# Patient Record
Sex: Male | Born: 1950 | Race: White | Hispanic: No | Marital: Single | Smoking: Never smoker
Health system: Southern US, Community
[De-identification: ages and names within clinical notes are randomized; demographics above are authoritative.]

## PROBLEM LIST (undated history)

## (undated) DIAGNOSIS — I2510 Atherosclerotic heart disease of native coronary artery without angina pectoris: Secondary | ICD-10-CM

## (undated) DIAGNOSIS — N289 Disorder of kidney and ureter, unspecified: Secondary | ICD-10-CM

## (undated) DIAGNOSIS — I4891 Unspecified atrial fibrillation: Secondary | ICD-10-CM

## (undated) DIAGNOSIS — I10 Essential (primary) hypertension: Secondary | ICD-10-CM

## (undated) DIAGNOSIS — E119 Type 2 diabetes mellitus without complications: Secondary | ICD-10-CM

## (undated) DIAGNOSIS — I509 Heart failure, unspecified: Secondary | ICD-10-CM

## (undated) HISTORY — DX: Type 2 diabetes mellitus without complications: E11.9

---

## 2005-09-25 ENCOUNTER — Emergency Department: Payer: Self-pay | Admitting: Emergency Medicine

## 2008-04-27 ENCOUNTER — Inpatient Hospital Stay: Payer: Self-pay | Admitting: Internal Medicine

## 2008-05-04 ENCOUNTER — Emergency Department: Payer: Self-pay | Admitting: Emergency Medicine

## 2008-05-06 ENCOUNTER — Ambulatory Visit: Payer: Self-pay | Admitting: Nephrology

## 2008-05-20 ENCOUNTER — Ambulatory Visit: Payer: Self-pay | Admitting: Nephrology

## 2008-07-02 ENCOUNTER — Ambulatory Visit: Payer: Self-pay | Admitting: Family

## 2008-07-21 ENCOUNTER — Ambulatory Visit: Payer: Self-pay | Admitting: Family

## 2008-08-16 ENCOUNTER — Ambulatory Visit: Payer: Self-pay | Admitting: Internal Medicine

## 2008-10-21 ENCOUNTER — Ambulatory Visit: Payer: Self-pay | Admitting: Family

## 2009-01-19 ENCOUNTER — Ambulatory Visit: Payer: Self-pay | Admitting: Family

## 2009-02-14 ENCOUNTER — Ambulatory Visit: Payer: Self-pay | Admitting: Cardiology

## 2009-06-02 ENCOUNTER — Ambulatory Visit: Payer: Self-pay | Admitting: Family

## 2009-09-21 ENCOUNTER — Ambulatory Visit: Payer: Self-pay | Admitting: Family

## 2010-01-26 ENCOUNTER — Ambulatory Visit: Payer: Self-pay | Admitting: Family

## 2010-05-25 ENCOUNTER — Ambulatory Visit: Payer: Self-pay | Admitting: Family

## 2013-07-21 DIAGNOSIS — I10 Essential (primary) hypertension: Secondary | ICD-10-CM | POA: Diagnosis present

## 2013-07-21 DIAGNOSIS — I2510 Atherosclerotic heart disease of native coronary artery without angina pectoris: Secondary | ICD-10-CM | POA: Diagnosis present

## 2013-09-22 DIAGNOSIS — E785 Hyperlipidemia, unspecified: Secondary | ICD-10-CM | POA: Diagnosis present

## 2014-02-22 DIAGNOSIS — I480 Paroxysmal atrial fibrillation: Secondary | ICD-10-CM | POA: Diagnosis present

## 2015-04-28 DIAGNOSIS — I480 Paroxysmal atrial fibrillation: Secondary | ICD-10-CM | POA: Diagnosis not present

## 2015-05-10 DIAGNOSIS — I480 Paroxysmal atrial fibrillation: Secondary | ICD-10-CM | POA: Diagnosis not present

## 2015-06-08 DIAGNOSIS — N183 Chronic kidney disease, stage 3 (moderate): Secondary | ICD-10-CM | POA: Diagnosis not present

## 2015-06-08 DIAGNOSIS — I482 Chronic atrial fibrillation: Secondary | ICD-10-CM | POA: Diagnosis not present

## 2015-06-08 DIAGNOSIS — Z125 Encounter for screening for malignant neoplasm of prostate: Secondary | ICD-10-CM | POA: Diagnosis not present

## 2015-06-08 DIAGNOSIS — I480 Paroxysmal atrial fibrillation: Secondary | ICD-10-CM | POA: Diagnosis not present

## 2015-06-08 DIAGNOSIS — E7800 Pure hypercholesterolemia, unspecified: Secondary | ICD-10-CM | POA: Diagnosis not present

## 2015-06-08 DIAGNOSIS — Z1211 Encounter for screening for malignant neoplasm of colon: Secondary | ICD-10-CM | POA: Diagnosis not present

## 2015-06-08 DIAGNOSIS — Z0000 Encounter for general adult medical examination without abnormal findings: Secondary | ICD-10-CM | POA: Diagnosis not present

## 2015-06-08 DIAGNOSIS — I10 Essential (primary) hypertension: Secondary | ICD-10-CM | POA: Diagnosis not present

## 2015-06-08 DIAGNOSIS — Z79899 Other long term (current) drug therapy: Secondary | ICD-10-CM | POA: Diagnosis not present

## 2015-06-08 DIAGNOSIS — I517 Cardiomegaly: Secondary | ICD-10-CM | POA: Diagnosis not present

## 2015-06-08 DIAGNOSIS — R7309 Other abnormal glucose: Secondary | ICD-10-CM | POA: Diagnosis not present

## 2015-06-08 DIAGNOSIS — R739 Hyperglycemia, unspecified: Secondary | ICD-10-CM | POA: Diagnosis not present

## 2015-07-06 DIAGNOSIS — Z79899 Other long term (current) drug therapy: Secondary | ICD-10-CM | POA: Diagnosis not present

## 2015-07-06 DIAGNOSIS — Z125 Encounter for screening for malignant neoplasm of prostate: Secondary | ICD-10-CM | POA: Diagnosis not present

## 2015-07-06 DIAGNOSIS — R7309 Other abnormal glucose: Secondary | ICD-10-CM | POA: Diagnosis not present

## 2015-07-06 DIAGNOSIS — E7800 Pure hypercholesterolemia, unspecified: Secondary | ICD-10-CM | POA: Diagnosis not present

## 2015-07-06 DIAGNOSIS — Z1211 Encounter for screening for malignant neoplasm of colon: Secondary | ICD-10-CM | POA: Diagnosis not present

## 2015-07-06 DIAGNOSIS — I480 Paroxysmal atrial fibrillation: Secondary | ICD-10-CM | POA: Diagnosis not present

## 2015-07-06 DIAGNOSIS — I10 Essential (primary) hypertension: Secondary | ICD-10-CM | POA: Diagnosis not present

## 2015-07-11 DIAGNOSIS — R7309 Other abnormal glucose: Secondary | ICD-10-CM | POA: Diagnosis not present

## 2015-07-11 DIAGNOSIS — Z125 Encounter for screening for malignant neoplasm of prostate: Secondary | ICD-10-CM | POA: Diagnosis not present

## 2015-07-11 DIAGNOSIS — Z1211 Encounter for screening for malignant neoplasm of colon: Secondary | ICD-10-CM | POA: Diagnosis not present

## 2015-07-11 DIAGNOSIS — I10 Essential (primary) hypertension: Secondary | ICD-10-CM | POA: Diagnosis not present

## 2015-07-11 DIAGNOSIS — E7800 Pure hypercholesterolemia, unspecified: Secondary | ICD-10-CM | POA: Diagnosis not present

## 2015-07-11 DIAGNOSIS — Z79899 Other long term (current) drug therapy: Secondary | ICD-10-CM | POA: Diagnosis not present

## 2015-08-03 DIAGNOSIS — I480 Paroxysmal atrial fibrillation: Secondary | ICD-10-CM | POA: Diagnosis not present

## 2015-08-03 DIAGNOSIS — N2581 Secondary hyperparathyroidism of renal origin: Secondary | ICD-10-CM | POA: Diagnosis not present

## 2015-08-03 DIAGNOSIS — I5022 Chronic systolic (congestive) heart failure: Secondary | ICD-10-CM | POA: Diagnosis not present

## 2015-08-03 DIAGNOSIS — I10 Essential (primary) hypertension: Secondary | ICD-10-CM | POA: Diagnosis not present

## 2015-08-03 DIAGNOSIS — N183 Chronic kidney disease, stage 3 (moderate): Secondary | ICD-10-CM | POA: Diagnosis not present

## 2015-08-22 DIAGNOSIS — I480 Paroxysmal atrial fibrillation: Secondary | ICD-10-CM | POA: Diagnosis not present

## 2015-09-19 DIAGNOSIS — I480 Paroxysmal atrial fibrillation: Secondary | ICD-10-CM | POA: Diagnosis not present

## 2015-10-24 DIAGNOSIS — I480 Paroxysmal atrial fibrillation: Secondary | ICD-10-CM | POA: Diagnosis not present

## 2015-11-21 DIAGNOSIS — I480 Paroxysmal atrial fibrillation: Secondary | ICD-10-CM | POA: Diagnosis not present

## 2015-12-15 DIAGNOSIS — I480 Paroxysmal atrial fibrillation: Secondary | ICD-10-CM | POA: Diagnosis not present

## 2015-12-15 DIAGNOSIS — I10 Essential (primary) hypertension: Secondary | ICD-10-CM | POA: Diagnosis not present

## 2015-12-15 DIAGNOSIS — E876 Hypokalemia: Secondary | ICD-10-CM | POA: Diagnosis not present

## 2015-12-15 DIAGNOSIS — R739 Hyperglycemia, unspecified: Secondary | ICD-10-CM | POA: Diagnosis not present

## 2015-12-15 DIAGNOSIS — T502X5A Adverse effect of carbonic-anhydrase inhibitors, benzothiadiazides and other diuretics, initial encounter: Secondary | ICD-10-CM | POA: Diagnosis not present

## 2015-12-15 DIAGNOSIS — Z7901 Long term (current) use of anticoagulants: Secondary | ICD-10-CM

## 2015-12-15 DIAGNOSIS — Z79899 Other long term (current) drug therapy: Secondary | ICD-10-CM | POA: Diagnosis not present

## 2015-12-15 DIAGNOSIS — N183 Chronic kidney disease, stage 3 (moderate): Secondary | ICD-10-CM | POA: Diagnosis not present

## 2015-12-15 DIAGNOSIS — E7800 Pure hypercholesterolemia, unspecified: Secondary | ICD-10-CM | POA: Diagnosis not present

## 2015-12-26 DIAGNOSIS — I10 Essential (primary) hypertension: Secondary | ICD-10-CM | POA: Diagnosis not present

## 2015-12-26 DIAGNOSIS — Z79899 Other long term (current) drug therapy: Secondary | ICD-10-CM | POA: Diagnosis not present

## 2015-12-26 DIAGNOSIS — E7800 Pure hypercholesterolemia, unspecified: Secondary | ICD-10-CM | POA: Diagnosis not present

## 2015-12-26 DIAGNOSIS — I480 Paroxysmal atrial fibrillation: Secondary | ICD-10-CM | POA: Diagnosis not present

## 2015-12-26 DIAGNOSIS — R739 Hyperglycemia, unspecified: Secondary | ICD-10-CM | POA: Diagnosis not present

## 2016-01-30 DIAGNOSIS — I480 Paroxysmal atrial fibrillation: Secondary | ICD-10-CM | POA: Diagnosis not present

## 2016-02-16 DIAGNOSIS — I5022 Chronic systolic (congestive) heart failure: Secondary | ICD-10-CM | POA: Diagnosis not present

## 2016-02-16 DIAGNOSIS — I10 Essential (primary) hypertension: Secondary | ICD-10-CM | POA: Diagnosis not present

## 2016-02-16 DIAGNOSIS — N183 Chronic kidney disease, stage 3 (moderate): Secondary | ICD-10-CM | POA: Diagnosis not present

## 2016-02-16 DIAGNOSIS — N2581 Secondary hyperparathyroidism of renal origin: Secondary | ICD-10-CM | POA: Diagnosis not present

## 2016-02-22 DIAGNOSIS — I4891 Unspecified atrial fibrillation: Secondary | ICD-10-CM | POA: Diagnosis not present

## 2016-02-22 DIAGNOSIS — I509 Heart failure, unspecified: Secondary | ICD-10-CM | POA: Diagnosis not present

## 2016-02-22 DIAGNOSIS — I2510 Atherosclerotic heart disease of native coronary artery without angina pectoris: Secondary | ICD-10-CM | POA: Diagnosis not present

## 2016-02-22 DIAGNOSIS — I10 Essential (primary) hypertension: Secondary | ICD-10-CM | POA: Diagnosis not present

## 2016-02-22 DIAGNOSIS — E7800 Pure hypercholesterolemia, unspecified: Secondary | ICD-10-CM | POA: Diagnosis not present

## 2016-02-22 DIAGNOSIS — I480 Paroxysmal atrial fibrillation: Secondary | ICD-10-CM | POA: Diagnosis not present

## 2016-02-22 DIAGNOSIS — I429 Cardiomyopathy, unspecified: Secondary | ICD-10-CM | POA: Diagnosis not present

## 2016-03-14 DIAGNOSIS — I5022 Chronic systolic (congestive) heart failure: Secondary | ICD-10-CM | POA: Diagnosis not present

## 2016-03-14 DIAGNOSIS — I10 Essential (primary) hypertension: Secondary | ICD-10-CM | POA: Diagnosis not present

## 2016-03-14 DIAGNOSIS — N183 Chronic kidney disease, stage 3 (moderate): Secondary | ICD-10-CM | POA: Diagnosis not present

## 2016-03-14 DIAGNOSIS — N2581 Secondary hyperparathyroidism of renal origin: Secondary | ICD-10-CM | POA: Diagnosis not present

## 2016-03-28 DIAGNOSIS — I480 Paroxysmal atrial fibrillation: Secondary | ICD-10-CM | POA: Diagnosis not present

## 2016-04-06 DIAGNOSIS — I480 Paroxysmal atrial fibrillation: Secondary | ICD-10-CM | POA: Diagnosis not present

## 2016-05-07 DIAGNOSIS — I480 Paroxysmal atrial fibrillation: Secondary | ICD-10-CM | POA: Diagnosis not present

## 2016-05-22 DIAGNOSIS — I4891 Unspecified atrial fibrillation: Secondary | ICD-10-CM | POA: Diagnosis not present

## 2016-06-05 DIAGNOSIS — I4891 Unspecified atrial fibrillation: Secondary | ICD-10-CM | POA: Diagnosis not present

## 2016-06-19 DIAGNOSIS — Z125 Encounter for screening for malignant neoplasm of prostate: Secondary | ICD-10-CM | POA: Diagnosis not present

## 2016-06-19 DIAGNOSIS — E7800 Pure hypercholesterolemia, unspecified: Secondary | ICD-10-CM | POA: Diagnosis not present

## 2016-06-19 DIAGNOSIS — I480 Paroxysmal atrial fibrillation: Secondary | ICD-10-CM | POA: Diagnosis not present

## 2016-06-19 DIAGNOSIS — Z79899 Other long term (current) drug therapy: Secondary | ICD-10-CM | POA: Diagnosis not present

## 2016-06-19 DIAGNOSIS — E876 Hypokalemia: Secondary | ICD-10-CM | POA: Diagnosis not present

## 2016-06-19 DIAGNOSIS — Z1211 Encounter for screening for malignant neoplasm of colon: Secondary | ICD-10-CM | POA: Diagnosis not present

## 2016-06-19 DIAGNOSIS — Z0000 Encounter for general adult medical examination without abnormal findings: Secondary | ICD-10-CM | POA: Diagnosis not present

## 2016-06-19 DIAGNOSIS — N183 Chronic kidney disease, stage 3 (moderate): Secondary | ICD-10-CM | POA: Diagnosis not present

## 2016-06-19 DIAGNOSIS — M1712 Unilateral primary osteoarthritis, left knee: Secondary | ICD-10-CM | POA: Diagnosis not present

## 2016-06-19 DIAGNOSIS — M25562 Pain in left knee: Secondary | ICD-10-CM | POA: Diagnosis not present

## 2016-06-19 DIAGNOSIS — R739 Hyperglycemia, unspecified: Secondary | ICD-10-CM | POA: Diagnosis not present

## 2016-06-19 DIAGNOSIS — I10 Essential (primary) hypertension: Secondary | ICD-10-CM | POA: Diagnosis not present

## 2016-06-19 DIAGNOSIS — I482 Chronic atrial fibrillation: Secondary | ICD-10-CM | POA: Diagnosis not present

## 2016-07-03 DIAGNOSIS — I10 Essential (primary) hypertension: Secondary | ICD-10-CM | POA: Diagnosis not present

## 2016-07-03 DIAGNOSIS — Z125 Encounter for screening for malignant neoplasm of prostate: Secondary | ICD-10-CM | POA: Diagnosis not present

## 2016-07-03 DIAGNOSIS — Z79899 Other long term (current) drug therapy: Secondary | ICD-10-CM | POA: Diagnosis not present

## 2016-07-03 DIAGNOSIS — R739 Hyperglycemia, unspecified: Secondary | ICD-10-CM | POA: Diagnosis not present

## 2016-07-03 DIAGNOSIS — E7800 Pure hypercholesterolemia, unspecified: Secondary | ICD-10-CM | POA: Diagnosis not present

## 2016-07-03 DIAGNOSIS — I4891 Unspecified atrial fibrillation: Secondary | ICD-10-CM | POA: Diagnosis not present

## 2016-07-16 DIAGNOSIS — Z1211 Encounter for screening for malignant neoplasm of colon: Secondary | ICD-10-CM | POA: Diagnosis not present

## 2016-07-31 DIAGNOSIS — I4891 Unspecified atrial fibrillation: Secondary | ICD-10-CM | POA: Diagnosis not present

## 2016-08-14 DIAGNOSIS — N183 Chronic kidney disease, stage 3 (moderate): Secondary | ICD-10-CM | POA: Diagnosis not present

## 2016-08-14 DIAGNOSIS — I10 Essential (primary) hypertension: Secondary | ICD-10-CM | POA: Diagnosis not present

## 2016-08-14 DIAGNOSIS — N2581 Secondary hyperparathyroidism of renal origin: Secondary | ICD-10-CM | POA: Diagnosis not present

## 2016-08-14 DIAGNOSIS — I5022 Chronic systolic (congestive) heart failure: Secondary | ICD-10-CM | POA: Diagnosis not present

## 2016-08-27 DIAGNOSIS — I4891 Unspecified atrial fibrillation: Secondary | ICD-10-CM | POA: Diagnosis not present

## 2016-09-05 DIAGNOSIS — Z7901 Long term (current) use of anticoagulants: Secondary | ICD-10-CM | POA: Diagnosis not present

## 2016-09-05 DIAGNOSIS — Z5181 Encounter for therapeutic drug level monitoring: Secondary | ICD-10-CM | POA: Diagnosis not present

## 2016-10-03 DIAGNOSIS — I4891 Unspecified atrial fibrillation: Secondary | ICD-10-CM | POA: Diagnosis not present

## 2016-10-22 DIAGNOSIS — Z5181 Encounter for therapeutic drug level monitoring: Secondary | ICD-10-CM | POA: Diagnosis not present

## 2016-10-22 DIAGNOSIS — Z7901 Long term (current) use of anticoagulants: Secondary | ICD-10-CM | POA: Diagnosis not present

## 2016-11-19 DIAGNOSIS — I4891 Unspecified atrial fibrillation: Secondary | ICD-10-CM | POA: Diagnosis not present

## 2016-12-20 DIAGNOSIS — Z79899 Other long term (current) drug therapy: Secondary | ICD-10-CM | POA: Diagnosis not present

## 2016-12-20 DIAGNOSIS — R739 Hyperglycemia, unspecified: Secondary | ICD-10-CM | POA: Diagnosis not present

## 2016-12-20 DIAGNOSIS — I10 Essential (primary) hypertension: Secondary | ICD-10-CM | POA: Diagnosis not present

## 2016-12-20 DIAGNOSIS — I480 Paroxysmal atrial fibrillation: Secondary | ICD-10-CM | POA: Diagnosis not present

## 2016-12-20 DIAGNOSIS — E7800 Pure hypercholesterolemia, unspecified: Secondary | ICD-10-CM | POA: Diagnosis not present

## 2016-12-20 DIAGNOSIS — N183 Chronic kidney disease, stage 3 (moderate): Secondary | ICD-10-CM | POA: Diagnosis not present

## 2016-12-24 DIAGNOSIS — R739 Hyperglycemia, unspecified: Secondary | ICD-10-CM | POA: Diagnosis not present

## 2016-12-24 DIAGNOSIS — I480 Paroxysmal atrial fibrillation: Secondary | ICD-10-CM | POA: Diagnosis not present

## 2017-01-28 DIAGNOSIS — I4891 Unspecified atrial fibrillation: Secondary | ICD-10-CM | POA: Diagnosis not present

## 2017-02-18 DIAGNOSIS — I10 Essential (primary) hypertension: Secondary | ICD-10-CM | POA: Diagnosis not present

## 2017-02-18 DIAGNOSIS — N2581 Secondary hyperparathyroidism of renal origin: Secondary | ICD-10-CM | POA: Diagnosis not present

## 2017-02-18 DIAGNOSIS — N183 Chronic kidney disease, stage 3 (moderate): Secondary | ICD-10-CM | POA: Diagnosis not present

## 2017-02-18 DIAGNOSIS — I5022 Chronic systolic (congestive) heart failure: Secondary | ICD-10-CM | POA: Diagnosis not present

## 2017-03-06 DIAGNOSIS — I10 Essential (primary) hypertension: Secondary | ICD-10-CM | POA: Diagnosis not present

## 2017-03-06 DIAGNOSIS — E7801 Familial hypercholesterolemia: Secondary | ICD-10-CM | POA: Diagnosis not present

## 2017-03-06 DIAGNOSIS — I200 Unstable angina: Secondary | ICD-10-CM | POA: Diagnosis not present

## 2017-03-06 DIAGNOSIS — I480 Paroxysmal atrial fibrillation: Secondary | ICD-10-CM | POA: Diagnosis not present

## 2017-03-06 DIAGNOSIS — I2510 Atherosclerotic heart disease of native coronary artery without angina pectoris: Secondary | ICD-10-CM | POA: Diagnosis not present

## 2017-03-06 DIAGNOSIS — I4891 Unspecified atrial fibrillation: Secondary | ICD-10-CM | POA: Diagnosis not present

## 2017-03-06 DIAGNOSIS — I429 Cardiomyopathy, unspecified: Secondary | ICD-10-CM | POA: Diagnosis not present

## 2017-04-02 DIAGNOSIS — I4891 Unspecified atrial fibrillation: Secondary | ICD-10-CM | POA: Diagnosis not present

## 2017-04-23 DIAGNOSIS — I4891 Unspecified atrial fibrillation: Secondary | ICD-10-CM | POA: Diagnosis not present

## 2017-05-13 DIAGNOSIS — I10 Essential (primary) hypertension: Secondary | ICD-10-CM | POA: Diagnosis not present

## 2017-05-13 DIAGNOSIS — N183 Chronic kidney disease, stage 3 (moderate): Secondary | ICD-10-CM | POA: Diagnosis not present

## 2017-05-13 DIAGNOSIS — B029 Zoster without complications: Secondary | ICD-10-CM | POA: Diagnosis not present

## 2017-05-21 DIAGNOSIS — I4891 Unspecified atrial fibrillation: Secondary | ICD-10-CM | POA: Diagnosis not present

## 2017-06-17 DIAGNOSIS — I4891 Unspecified atrial fibrillation: Secondary | ICD-10-CM | POA: Diagnosis not present

## 2017-06-20 DIAGNOSIS — E7801 Familial hypercholesterolemia: Secondary | ICD-10-CM | POA: Diagnosis not present

## 2017-06-20 DIAGNOSIS — Z79899 Other long term (current) drug therapy: Secondary | ICD-10-CM | POA: Diagnosis not present

## 2017-06-20 DIAGNOSIS — R739 Hyperglycemia, unspecified: Secondary | ICD-10-CM | POA: Diagnosis not present

## 2017-06-20 DIAGNOSIS — Z0000 Encounter for general adult medical examination without abnormal findings: Secondary | ICD-10-CM | POA: Diagnosis not present

## 2017-06-20 DIAGNOSIS — Z125 Encounter for screening for malignant neoplasm of prostate: Secondary | ICD-10-CM | POA: Diagnosis not present

## 2017-06-20 DIAGNOSIS — I10 Essential (primary) hypertension: Secondary | ICD-10-CM | POA: Diagnosis not present

## 2017-06-20 DIAGNOSIS — N183 Chronic kidney disease, stage 3 (moderate): Secondary | ICD-10-CM | POA: Diagnosis not present

## 2017-06-20 DIAGNOSIS — I480 Paroxysmal atrial fibrillation: Secondary | ICD-10-CM | POA: Diagnosis not present

## 2017-06-20 DIAGNOSIS — Z1211 Encounter for screening for malignant neoplasm of colon: Secondary | ICD-10-CM | POA: Diagnosis not present

## 2017-07-15 DIAGNOSIS — Z79899 Other long term (current) drug therapy: Secondary | ICD-10-CM | POA: Diagnosis not present

## 2017-07-15 DIAGNOSIS — R739 Hyperglycemia, unspecified: Secondary | ICD-10-CM | POA: Diagnosis not present

## 2017-07-15 DIAGNOSIS — I10 Essential (primary) hypertension: Secondary | ICD-10-CM | POA: Diagnosis not present

## 2017-07-15 DIAGNOSIS — E7801 Familial hypercholesterolemia: Secondary | ICD-10-CM | POA: Diagnosis not present

## 2017-07-15 DIAGNOSIS — Z125 Encounter for screening for malignant neoplasm of prostate: Secondary | ICD-10-CM | POA: Diagnosis not present

## 2017-07-15 DIAGNOSIS — Z1211 Encounter for screening for malignant neoplasm of colon: Secondary | ICD-10-CM | POA: Diagnosis not present

## 2017-07-15 DIAGNOSIS — I4891 Unspecified atrial fibrillation: Secondary | ICD-10-CM | POA: Diagnosis not present

## 2017-07-18 DIAGNOSIS — Z1211 Encounter for screening for malignant neoplasm of colon: Secondary | ICD-10-CM | POA: Diagnosis not present

## 2017-07-18 DIAGNOSIS — R739 Hyperglycemia, unspecified: Secondary | ICD-10-CM | POA: Diagnosis not present

## 2017-07-18 DIAGNOSIS — Z79899 Other long term (current) drug therapy: Secondary | ICD-10-CM | POA: Diagnosis not present

## 2017-07-18 DIAGNOSIS — I10 Essential (primary) hypertension: Secondary | ICD-10-CM | POA: Diagnosis not present

## 2017-07-18 DIAGNOSIS — Z125 Encounter for screening for malignant neoplasm of prostate: Secondary | ICD-10-CM | POA: Diagnosis not present

## 2017-07-18 DIAGNOSIS — E7801 Familial hypercholesterolemia: Secondary | ICD-10-CM | POA: Diagnosis not present

## 2017-08-05 DIAGNOSIS — I480 Paroxysmal atrial fibrillation: Secondary | ICD-10-CM | POA: Diagnosis not present

## 2017-08-28 DIAGNOSIS — I4891 Unspecified atrial fibrillation: Secondary | ICD-10-CM | POA: Diagnosis not present

## 2017-08-29 DIAGNOSIS — I5022 Chronic systolic (congestive) heart failure: Secondary | ICD-10-CM | POA: Diagnosis not present

## 2017-08-29 DIAGNOSIS — I10 Essential (primary) hypertension: Secondary | ICD-10-CM | POA: Diagnosis not present

## 2017-08-29 DIAGNOSIS — N183 Chronic kidney disease, stage 3 (moderate): Secondary | ICD-10-CM | POA: Diagnosis not present

## 2017-08-29 DIAGNOSIS — N2581 Secondary hyperparathyroidism of renal origin: Secondary | ICD-10-CM | POA: Diagnosis not present

## 2017-09-24 DIAGNOSIS — I4891 Unspecified atrial fibrillation: Secondary | ICD-10-CM | POA: Diagnosis not present

## 2017-10-23 DIAGNOSIS — I4891 Unspecified atrial fibrillation: Secondary | ICD-10-CM | POA: Diagnosis not present

## 2017-11-27 DIAGNOSIS — I4891 Unspecified atrial fibrillation: Secondary | ICD-10-CM | POA: Diagnosis not present

## 2017-12-23 DIAGNOSIS — E7801 Familial hypercholesterolemia: Secondary | ICD-10-CM | POA: Diagnosis not present

## 2017-12-23 DIAGNOSIS — I480 Paroxysmal atrial fibrillation: Secondary | ICD-10-CM | POA: Diagnosis not present

## 2017-12-23 DIAGNOSIS — I10 Essential (primary) hypertension: Secondary | ICD-10-CM | POA: Diagnosis not present

## 2017-12-23 DIAGNOSIS — T502X5A Adverse effect of carbonic-anhydrase inhibitors, benzothiadiazides and other diuretics, initial encounter: Secondary | ICD-10-CM | POA: Diagnosis not present

## 2017-12-23 DIAGNOSIS — I482 Chronic atrial fibrillation: Secondary | ICD-10-CM | POA: Diagnosis not present

## 2017-12-23 DIAGNOSIS — R739 Hyperglycemia, unspecified: Secondary | ICD-10-CM | POA: Diagnosis not present

## 2017-12-23 DIAGNOSIS — E876 Hypokalemia: Secondary | ICD-10-CM | POA: Diagnosis not present

## 2017-12-23 DIAGNOSIS — Z79899 Other long term (current) drug therapy: Secondary | ICD-10-CM | POA: Diagnosis not present

## 2017-12-23 DIAGNOSIS — E7800 Pure hypercholesterolemia, unspecified: Secondary | ICD-10-CM | POA: Diagnosis not present

## 2017-12-23 DIAGNOSIS — Z7901 Long term (current) use of anticoagulants: Secondary | ICD-10-CM | POA: Diagnosis not present

## 2017-12-23 DIAGNOSIS — N183 Chronic kidney disease, stage 3 (moderate): Secondary | ICD-10-CM | POA: Diagnosis not present

## 2018-01-09 DIAGNOSIS — I10 Essential (primary) hypertension: Secondary | ICD-10-CM | POA: Diagnosis not present

## 2018-01-09 DIAGNOSIS — E7801 Familial hypercholesterolemia: Secondary | ICD-10-CM | POA: Diagnosis not present

## 2018-01-09 DIAGNOSIS — I4891 Unspecified atrial fibrillation: Secondary | ICD-10-CM | POA: Diagnosis not present

## 2018-01-09 DIAGNOSIS — Z79899 Other long term (current) drug therapy: Secondary | ICD-10-CM | POA: Diagnosis not present

## 2018-01-09 DIAGNOSIS — R739 Hyperglycemia, unspecified: Secondary | ICD-10-CM | POA: Diagnosis not present

## 2018-02-11 DIAGNOSIS — Z7901 Long term (current) use of anticoagulants: Secondary | ICD-10-CM | POA: Diagnosis not present

## 2018-02-27 DIAGNOSIS — I10 Essential (primary) hypertension: Secondary | ICD-10-CM | POA: Diagnosis not present

## 2018-02-27 DIAGNOSIS — I429 Cardiomyopathy, unspecified: Secondary | ICD-10-CM | POA: Diagnosis not present

## 2018-02-27 DIAGNOSIS — I480 Paroxysmal atrial fibrillation: Secondary | ICD-10-CM | POA: Diagnosis not present

## 2018-02-27 DIAGNOSIS — Z7901 Long term (current) use of anticoagulants: Secondary | ICD-10-CM | POA: Diagnosis not present

## 2018-03-06 DIAGNOSIS — N2581 Secondary hyperparathyroidism of renal origin: Secondary | ICD-10-CM | POA: Diagnosis not present

## 2018-03-06 DIAGNOSIS — I5022 Chronic systolic (congestive) heart failure: Secondary | ICD-10-CM | POA: Diagnosis not present

## 2018-03-06 DIAGNOSIS — I10 Essential (primary) hypertension: Secondary | ICD-10-CM | POA: Diagnosis not present

## 2018-03-06 DIAGNOSIS — N183 Chronic kidney disease, stage 3 (moderate): Secondary | ICD-10-CM | POA: Diagnosis not present

## 2018-03-10 DIAGNOSIS — Z7901 Long term (current) use of anticoagulants: Secondary | ICD-10-CM | POA: Diagnosis not present

## 2018-03-31 DIAGNOSIS — Z7901 Long term (current) use of anticoagulants: Secondary | ICD-10-CM | POA: Diagnosis not present

## 2018-04-29 DIAGNOSIS — Z7901 Long term (current) use of anticoagulants: Secondary | ICD-10-CM | POA: Diagnosis not present

## 2018-06-05 DIAGNOSIS — Z7901 Long term (current) use of anticoagulants: Secondary | ICD-10-CM | POA: Diagnosis not present

## 2018-06-24 DIAGNOSIS — Z125 Encounter for screening for malignant neoplasm of prostate: Secondary | ICD-10-CM | POA: Diagnosis not present

## 2018-06-24 DIAGNOSIS — Z1211 Encounter for screening for malignant neoplasm of colon: Secondary | ICD-10-CM | POA: Diagnosis not present

## 2018-06-24 DIAGNOSIS — Z0000 Encounter for general adult medical examination without abnormal findings: Secondary | ICD-10-CM | POA: Diagnosis not present

## 2018-06-24 DIAGNOSIS — R739 Hyperglycemia, unspecified: Secondary | ICD-10-CM | POA: Diagnosis not present

## 2018-06-24 DIAGNOSIS — I480 Paroxysmal atrial fibrillation: Secondary | ICD-10-CM | POA: Diagnosis not present

## 2018-06-24 DIAGNOSIS — Z79899 Other long term (current) drug therapy: Secondary | ICD-10-CM | POA: Diagnosis not present

## 2018-06-24 DIAGNOSIS — H6191 Disorder of right external ear, unspecified: Secondary | ICD-10-CM | POA: Diagnosis not present

## 2018-06-24 DIAGNOSIS — I10 Essential (primary) hypertension: Secondary | ICD-10-CM | POA: Diagnosis not present

## 2018-06-24 DIAGNOSIS — E7801 Familial hypercholesterolemia: Secondary | ICD-10-CM | POA: Diagnosis not present

## 2018-06-24 DIAGNOSIS — N183 Chronic kidney disease, stage 3 (moderate): Secondary | ICD-10-CM | POA: Diagnosis not present

## 2018-07-01 DIAGNOSIS — L578 Other skin changes due to chronic exposure to nonionizing radiation: Secondary | ICD-10-CM | POA: Diagnosis not present

## 2018-07-01 DIAGNOSIS — D485 Neoplasm of uncertain behavior of skin: Secondary | ICD-10-CM | POA: Diagnosis not present

## 2018-07-01 DIAGNOSIS — C44212 Basal cell carcinoma of skin of right ear and external auricular canal: Secondary | ICD-10-CM | POA: Diagnosis not present

## 2018-07-07 DIAGNOSIS — I10 Essential (primary) hypertension: Secondary | ICD-10-CM | POA: Diagnosis not present

## 2018-07-07 DIAGNOSIS — Z7901 Long term (current) use of anticoagulants: Secondary | ICD-10-CM | POA: Diagnosis not present

## 2018-07-07 DIAGNOSIS — Z125 Encounter for screening for malignant neoplasm of prostate: Secondary | ICD-10-CM | POA: Diagnosis not present

## 2018-07-07 DIAGNOSIS — R739 Hyperglycemia, unspecified: Secondary | ICD-10-CM | POA: Diagnosis not present

## 2018-07-07 DIAGNOSIS — E7801 Familial hypercholesterolemia: Secondary | ICD-10-CM | POA: Diagnosis not present

## 2018-07-07 DIAGNOSIS — Z79899 Other long term (current) drug therapy: Secondary | ICD-10-CM | POA: Diagnosis not present

## 2018-07-10 DIAGNOSIS — I5022 Chronic systolic (congestive) heart failure: Secondary | ICD-10-CM | POA: Diagnosis not present

## 2018-07-10 DIAGNOSIS — I10 Essential (primary) hypertension: Secondary | ICD-10-CM | POA: Diagnosis not present

## 2018-07-10 DIAGNOSIS — N2581 Secondary hyperparathyroidism of renal origin: Secondary | ICD-10-CM | POA: Diagnosis not present

## 2018-07-10 DIAGNOSIS — N183 Chronic kidney disease, stage 3 (moderate): Secondary | ICD-10-CM | POA: Diagnosis not present

## 2018-07-23 DIAGNOSIS — Z1211 Encounter for screening for malignant neoplasm of colon: Secondary | ICD-10-CM | POA: Diagnosis not present

## 2018-07-23 DIAGNOSIS — Z13820 Encounter for screening for osteoporosis: Secondary | ICD-10-CM | POA: Diagnosis not present

## 2018-07-30 DIAGNOSIS — D2221 Melanocytic nevi of right ear and external auricular canal: Secondary | ICD-10-CM | POA: Diagnosis not present

## 2018-07-30 DIAGNOSIS — C4491 Basal cell carcinoma of skin, unspecified: Secondary | ICD-10-CM | POA: Diagnosis not present

## 2018-08-13 DIAGNOSIS — Z7901 Long term (current) use of anticoagulants: Secondary | ICD-10-CM | POA: Diagnosis not present

## 2018-08-13 DIAGNOSIS — L905 Scar conditions and fibrosis of skin: Secondary | ICD-10-CM | POA: Diagnosis not present

## 2018-09-17 DIAGNOSIS — Z7901 Long term (current) use of anticoagulants: Secondary | ICD-10-CM | POA: Diagnosis not present

## 2018-10-20 DIAGNOSIS — Z7901 Long term (current) use of anticoagulants: Secondary | ICD-10-CM | POA: Diagnosis not present

## 2018-11-20 DIAGNOSIS — Z7901 Long term (current) use of anticoagulants: Secondary | ICD-10-CM | POA: Diagnosis not present

## 2018-12-25 DIAGNOSIS — I480 Paroxysmal atrial fibrillation: Secondary | ICD-10-CM | POA: Diagnosis not present

## 2018-12-25 DIAGNOSIS — E7801 Familial hypercholesterolemia: Secondary | ICD-10-CM | POA: Diagnosis not present

## 2018-12-25 DIAGNOSIS — I482 Chronic atrial fibrillation, unspecified: Secondary | ICD-10-CM | POA: Diagnosis not present

## 2018-12-25 DIAGNOSIS — E7800 Pure hypercholesterolemia, unspecified: Secondary | ICD-10-CM | POA: Diagnosis not present

## 2018-12-25 DIAGNOSIS — T502X5A Adverse effect of carbonic-anhydrase inhibitors, benzothiadiazides and other diuretics, initial encounter: Secondary | ICD-10-CM | POA: Diagnosis not present

## 2018-12-25 DIAGNOSIS — E876 Hypokalemia: Secondary | ICD-10-CM | POA: Diagnosis not present

## 2018-12-25 DIAGNOSIS — Z7901 Long term (current) use of anticoagulants: Secondary | ICD-10-CM | POA: Diagnosis not present

## 2018-12-25 DIAGNOSIS — I10 Essential (primary) hypertension: Secondary | ICD-10-CM | POA: Diagnosis not present

## 2018-12-25 DIAGNOSIS — R739 Hyperglycemia, unspecified: Secondary | ICD-10-CM | POA: Diagnosis not present

## 2018-12-25 DIAGNOSIS — N183 Chronic kidney disease, stage 3 (moderate): Secondary | ICD-10-CM | POA: Diagnosis not present

## 2018-12-25 DIAGNOSIS — Z79899 Other long term (current) drug therapy: Secondary | ICD-10-CM | POA: Diagnosis not present

## 2018-12-31 DIAGNOSIS — I480 Paroxysmal atrial fibrillation: Secondary | ICD-10-CM | POA: Diagnosis not present

## 2019-01-07 DIAGNOSIS — I480 Paroxysmal atrial fibrillation: Secondary | ICD-10-CM | POA: Diagnosis not present

## 2019-01-08 DIAGNOSIS — I10 Essential (primary) hypertension: Secondary | ICD-10-CM | POA: Diagnosis present

## 2019-01-08 DIAGNOSIS — N2581 Secondary hyperparathyroidism of renal origin: Secondary | ICD-10-CM | POA: Diagnosis present

## 2019-01-08 DIAGNOSIS — N183 Chronic kidney disease, stage 3 unspecified: Secondary | ICD-10-CM | POA: Diagnosis present

## 2019-01-08 DIAGNOSIS — I5022 Chronic systolic (congestive) heart failure: Secondary | ICD-10-CM | POA: Diagnosis present

## 2019-01-12 DIAGNOSIS — I5022 Chronic systolic (congestive) heart failure: Secondary | ICD-10-CM | POA: Diagnosis not present

## 2019-01-12 DIAGNOSIS — N183 Chronic kidney disease, stage 3 (moderate): Secondary | ICD-10-CM | POA: Diagnosis not present

## 2019-01-12 DIAGNOSIS — N2581 Secondary hyperparathyroidism of renal origin: Secondary | ICD-10-CM | POA: Diagnosis not present

## 2019-01-12 DIAGNOSIS — I10 Essential (primary) hypertension: Secondary | ICD-10-CM | POA: Diagnosis not present

## 2019-02-11 DIAGNOSIS — I10 Essential (primary) hypertension: Secondary | ICD-10-CM | POA: Diagnosis not present

## 2019-02-11 DIAGNOSIS — E7801 Familial hypercholesterolemia: Secondary | ICD-10-CM | POA: Diagnosis not present

## 2019-02-11 DIAGNOSIS — R739 Hyperglycemia, unspecified: Secondary | ICD-10-CM | POA: Diagnosis not present

## 2019-02-11 DIAGNOSIS — Z79899 Other long term (current) drug therapy: Secondary | ICD-10-CM | POA: Diagnosis not present

## 2019-02-11 DIAGNOSIS — I480 Paroxysmal atrial fibrillation: Secondary | ICD-10-CM | POA: Diagnosis not present

## 2019-03-17 DIAGNOSIS — I480 Paroxysmal atrial fibrillation: Secondary | ICD-10-CM | POA: Diagnosis not present

## 2019-03-17 DIAGNOSIS — E7801 Familial hypercholesterolemia: Secondary | ICD-10-CM | POA: Diagnosis not present

## 2019-03-17 DIAGNOSIS — I429 Cardiomyopathy, unspecified: Secondary | ICD-10-CM | POA: Diagnosis not present

## 2019-03-17 DIAGNOSIS — I2510 Atherosclerotic heart disease of native coronary artery without angina pectoris: Secondary | ICD-10-CM | POA: Diagnosis not present

## 2019-03-17 DIAGNOSIS — I10 Essential (primary) hypertension: Secondary | ICD-10-CM | POA: Diagnosis not present

## 2019-03-24 DIAGNOSIS — I480 Paroxysmal atrial fibrillation: Secondary | ICD-10-CM | POA: Diagnosis not present

## 2019-04-27 DIAGNOSIS — I480 Paroxysmal atrial fibrillation: Secondary | ICD-10-CM | POA: Diagnosis not present

## 2019-06-01 DIAGNOSIS — I480 Paroxysmal atrial fibrillation: Secondary | ICD-10-CM | POA: Diagnosis not present

## 2019-06-11 ENCOUNTER — Ambulatory Visit: Payer: PPO | Attending: Internal Medicine

## 2019-06-11 DIAGNOSIS — Z23 Encounter for immunization: Secondary | ICD-10-CM

## 2019-06-11 NOTE — Progress Notes (Signed)
   Covid-19 Vaccination Clinic  Name:  Timothy Chavez    MRN: [id number] DOB: Mar 01, 1951  06/11/2019  Mr. Rozas was observed post Covid-19 immunization for 15 minutes without incidence. He was provided with Vaccine Information Sheet and instruction to access the V-Safe system.   Mr. Holmen was instructed to call 911 with any severe reactions post vaccine: Marland Kitchen Difficulty breathing  . Swelling of your face and throat  . A fast heartbeat  . A bad rash all over your body  . Dizziness and weakness    Immunizations Administered    Name Date Dose VIS Date Route   Pfizer COVID-19 Vaccine 06/11/2019  9:18 AM 0.3 mL 03/27/2019 Intramuscular   Manufacturer: [place]   Lot: [id number]   [place]: [id number]

## 2019-07-02 DIAGNOSIS — I2510 Atherosclerotic heart disease of native coronary artery without angina pectoris: Secondary | ICD-10-CM | POA: Diagnosis not present

## 2019-07-02 DIAGNOSIS — E7801 Familial hypercholesterolemia: Secondary | ICD-10-CM | POA: Diagnosis not present

## 2019-07-02 DIAGNOSIS — R739 Hyperglycemia, unspecified: Secondary | ICD-10-CM | POA: Diagnosis not present

## 2019-07-02 DIAGNOSIS — I10 Essential (primary) hypertension: Secondary | ICD-10-CM | POA: Diagnosis not present

## 2019-07-02 DIAGNOSIS — N1832 Chronic kidney disease, stage 3b: Secondary | ICD-10-CM | POA: Diagnosis not present

## 2019-07-02 DIAGNOSIS — Z79899 Other long term (current) drug therapy: Secondary | ICD-10-CM | POA: Diagnosis not present

## 2019-07-02 DIAGNOSIS — Z7901 Long term (current) use of anticoagulants: Secondary | ICD-10-CM | POA: Diagnosis not present

## 2019-07-02 DIAGNOSIS — Z125 Encounter for screening for malignant neoplasm of prostate: Secondary | ICD-10-CM | POA: Diagnosis not present

## 2019-07-02 DIAGNOSIS — I480 Paroxysmal atrial fibrillation: Secondary | ICD-10-CM | POA: Diagnosis not present

## 2019-07-02 DIAGNOSIS — Z1211 Encounter for screening for malignant neoplasm of colon: Secondary | ICD-10-CM | POA: Diagnosis not present

## 2019-07-02 DIAGNOSIS — Z0000 Encounter for general adult medical examination without abnormal findings: Secondary | ICD-10-CM | POA: Diagnosis not present

## 2019-07-08 ENCOUNTER — Ambulatory Visit: Payer: PPO | Attending: Internal Medicine

## 2019-07-08 DIAGNOSIS — Z23 Encounter for immunization: Secondary | ICD-10-CM

## 2019-07-08 DIAGNOSIS — I480 Paroxysmal atrial fibrillation: Secondary | ICD-10-CM | POA: Diagnosis not present

## 2019-07-08 NOTE — Progress Notes (Signed)
   Covid-19 Vaccination Clinic  Name:  Timothy Chavez    MRN: [id number] DOB: 1950/10/27  07/08/2019  [name] was observed post Covid-19 immunization for 15 minutes without incident. He was provided with Vaccine Information Sheet and instruction to access the V-Safe system.   [name] was instructed to call 911 with any severe reactions post vaccine: Marland Kitchen Difficulty breathing  . Swelling of face and throat  . A fast heartbeat  . A bad rash all over body  . Dizziness and weakness   Immunizations Administered    Name Date Dose VIS Date Route   Pfizer COVID-19 Vaccine 07/08/2019  8:13 AM 0.3 mL 03/27/2019 Intramuscular   Manufacturer: [employer], [employer]   Lot: [id number]   [place]: [id number]

## 2019-07-09 DIAGNOSIS — I10 Essential (primary) hypertension: Secondary | ICD-10-CM | POA: Diagnosis not present

## 2019-07-09 DIAGNOSIS — N1831 Chronic kidney disease, stage 3a: Secondary | ICD-10-CM | POA: Diagnosis not present

## 2019-07-09 DIAGNOSIS — I5022 Chronic systolic (congestive) heart failure: Secondary | ICD-10-CM | POA: Diagnosis not present

## 2019-07-09 DIAGNOSIS — N2581 Secondary hyperparathyroidism of renal origin: Secondary | ICD-10-CM | POA: Diagnosis not present

## 2019-07-28 DIAGNOSIS — Z1211 Encounter for screening for malignant neoplasm of colon: Secondary | ICD-10-CM | POA: Diagnosis not present

## 2019-08-05 DIAGNOSIS — I480 Paroxysmal atrial fibrillation: Secondary | ICD-10-CM | POA: Diagnosis not present

## 2019-09-02 DIAGNOSIS — I480 Paroxysmal atrial fibrillation: Secondary | ICD-10-CM | POA: Diagnosis not present

## 2019-10-15 DIAGNOSIS — I480 Paroxysmal atrial fibrillation: Secondary | ICD-10-CM | POA: Diagnosis not present

## 2019-11-19 DIAGNOSIS — I480 Paroxysmal atrial fibrillation: Secondary | ICD-10-CM | POA: Diagnosis not present

## 2019-12-22 DIAGNOSIS — Z7901 Long term (current) use of anticoagulants: Secondary | ICD-10-CM | POA: Diagnosis not present

## 2020-01-28 DIAGNOSIS — Z7901 Long term (current) use of anticoagulants: Secondary | ICD-10-CM | POA: Diagnosis not present

## 2020-02-04 DIAGNOSIS — I5022 Chronic systolic (congestive) heart failure: Secondary | ICD-10-CM | POA: Diagnosis not present

## 2020-02-04 DIAGNOSIS — N2581 Secondary hyperparathyroidism of renal origin: Secondary | ICD-10-CM | POA: Diagnosis not present

## 2020-02-04 DIAGNOSIS — I10 Essential (primary) hypertension: Secondary | ICD-10-CM | POA: Diagnosis not present

## 2020-02-04 DIAGNOSIS — N1832 Chronic kidney disease, stage 3b: Secondary | ICD-10-CM | POA: Diagnosis not present

## 2020-02-04 DIAGNOSIS — N1831 Chronic kidney disease, stage 3a: Secondary | ICD-10-CM | POA: Diagnosis not present

## 2020-02-09 DIAGNOSIS — E118 Type 2 diabetes mellitus with unspecified complications: Secondary | ICD-10-CM | POA: Diagnosis present

## 2020-02-09 DIAGNOSIS — I10 Essential (primary) hypertension: Secondary | ICD-10-CM | POA: Diagnosis not present

## 2020-02-09 DIAGNOSIS — N1832 Chronic kidney disease, stage 3b: Secondary | ICD-10-CM | POA: Diagnosis not present

## 2020-02-09 DIAGNOSIS — Z7901 Long term (current) use of anticoagulants: Secondary | ICD-10-CM | POA: Diagnosis not present

## 2020-02-09 DIAGNOSIS — E7801 Familial hypercholesterolemia: Secondary | ICD-10-CM | POA: Diagnosis not present

## 2020-02-09 DIAGNOSIS — E7800 Pure hypercholesterolemia, unspecified: Secondary | ICD-10-CM | POA: Diagnosis not present

## 2020-02-09 DIAGNOSIS — Z79899 Other long term (current) drug therapy: Secondary | ICD-10-CM | POA: Diagnosis not present

## 2020-02-09 DIAGNOSIS — I482 Chronic atrial fibrillation, unspecified: Secondary | ICD-10-CM | POA: Diagnosis not present

## 2020-02-09 DIAGNOSIS — I480 Paroxysmal atrial fibrillation: Secondary | ICD-10-CM | POA: Diagnosis not present

## 2020-02-09 DIAGNOSIS — I2510 Atherosclerotic heart disease of native coronary artery without angina pectoris: Secondary | ICD-10-CM | POA: Diagnosis not present

## 2020-03-03 DIAGNOSIS — E118 Type 2 diabetes mellitus with unspecified complications: Secondary | ICD-10-CM | POA: Diagnosis not present

## 2020-03-03 DIAGNOSIS — Z79899 Other long term (current) drug therapy: Secondary | ICD-10-CM | POA: Diagnosis not present

## 2020-03-03 DIAGNOSIS — Z7901 Long term (current) use of anticoagulants: Secondary | ICD-10-CM | POA: Diagnosis not present

## 2020-03-03 DIAGNOSIS — E7801 Familial hypercholesterolemia: Secondary | ICD-10-CM | POA: Diagnosis not present

## 2020-03-03 DIAGNOSIS — I10 Essential (primary) hypertension: Secondary | ICD-10-CM | POA: Diagnosis not present

## 2020-03-23 DIAGNOSIS — I429 Cardiomyopathy, unspecified: Secondary | ICD-10-CM | POA: Diagnosis not present

## 2020-03-23 DIAGNOSIS — I200 Unstable angina: Secondary | ICD-10-CM | POA: Diagnosis not present

## 2020-03-23 DIAGNOSIS — I480 Paroxysmal atrial fibrillation: Secondary | ICD-10-CM | POA: Diagnosis not present

## 2020-03-23 DIAGNOSIS — I10 Essential (primary) hypertension: Secondary | ICD-10-CM | POA: Diagnosis not present

## 2020-03-23 DIAGNOSIS — E118 Type 2 diabetes mellitus with unspecified complications: Secondary | ICD-10-CM | POA: Diagnosis not present

## 2020-03-23 DIAGNOSIS — I5022 Chronic systolic (congestive) heart failure: Secondary | ICD-10-CM | POA: Diagnosis not present

## 2020-03-23 DIAGNOSIS — I2510 Atherosclerotic heart disease of native coronary artery without angina pectoris: Secondary | ICD-10-CM | POA: Diagnosis not present

## 2020-04-04 DIAGNOSIS — Z7901 Long term (current) use of anticoagulants: Secondary | ICD-10-CM | POA: Diagnosis not present

## 2020-05-10 DIAGNOSIS — Z7901 Long term (current) use of anticoagulants: Secondary | ICD-10-CM | POA: Diagnosis not present

## 2020-05-10 DIAGNOSIS — I429 Cardiomyopathy, unspecified: Secondary | ICD-10-CM | POA: Diagnosis not present

## 2020-05-10 DIAGNOSIS — I5022 Chronic systolic (congestive) heart failure: Secondary | ICD-10-CM | POA: Diagnosis not present

## 2020-06-09 DIAGNOSIS — Z7901 Long term (current) use of anticoagulants: Secondary | ICD-10-CM | POA: Diagnosis not present

## 2020-07-12 DIAGNOSIS — Z7901 Long term (current) use of anticoagulants: Secondary | ICD-10-CM | POA: Diagnosis not present

## 2020-07-20 DIAGNOSIS — Z0000 Encounter for general adult medical examination without abnormal findings: Secondary | ICD-10-CM | POA: Diagnosis not present

## 2020-07-20 DIAGNOSIS — Z1211 Encounter for screening for malignant neoplasm of colon: Secondary | ICD-10-CM | POA: Diagnosis not present

## 2020-07-20 DIAGNOSIS — I480 Paroxysmal atrial fibrillation: Secondary | ICD-10-CM | POA: Diagnosis not present

## 2020-07-20 DIAGNOSIS — E118 Type 2 diabetes mellitus with unspecified complications: Secondary | ICD-10-CM | POA: Diagnosis not present

## 2020-07-20 DIAGNOSIS — Z125 Encounter for screening for malignant neoplasm of prostate: Secondary | ICD-10-CM | POA: Diagnosis not present

## 2020-07-20 DIAGNOSIS — E7800 Pure hypercholesterolemia, unspecified: Secondary | ICD-10-CM | POA: Diagnosis not present

## 2020-07-20 DIAGNOSIS — I2510 Atherosclerotic heart disease of native coronary artery without angina pectoris: Secondary | ICD-10-CM | POA: Diagnosis not present

## 2020-07-20 DIAGNOSIS — N1832 Chronic kidney disease, stage 3b: Secondary | ICD-10-CM | POA: Diagnosis not present

## 2020-07-20 DIAGNOSIS — Z79899 Other long term (current) drug therapy: Secondary | ICD-10-CM | POA: Diagnosis not present

## 2020-07-20 DIAGNOSIS — I10 Essential (primary) hypertension: Secondary | ICD-10-CM | POA: Diagnosis not present

## 2020-08-24 DIAGNOSIS — E118 Type 2 diabetes mellitus with unspecified complications: Secondary | ICD-10-CM | POA: Diagnosis not present

## 2020-08-24 DIAGNOSIS — I10 Essential (primary) hypertension: Secondary | ICD-10-CM | POA: Diagnosis not present

## 2020-08-24 DIAGNOSIS — Z1211 Encounter for screening for malignant neoplasm of colon: Secondary | ICD-10-CM | POA: Diagnosis not present

## 2020-08-24 DIAGNOSIS — Z7901 Long term (current) use of anticoagulants: Secondary | ICD-10-CM | POA: Diagnosis not present

## 2020-08-24 DIAGNOSIS — E7800 Pure hypercholesterolemia, unspecified: Secondary | ICD-10-CM | POA: Diagnosis not present

## 2020-08-24 DIAGNOSIS — Z125 Encounter for screening for malignant neoplasm of prostate: Secondary | ICD-10-CM | POA: Diagnosis not present

## 2020-08-24 DIAGNOSIS — Z79899 Other long term (current) drug therapy: Secondary | ICD-10-CM | POA: Diagnosis not present

## 2020-09-01 DIAGNOSIS — N1832 Chronic kidney disease, stage 3b: Secondary | ICD-10-CM | POA: Diagnosis not present

## 2020-09-01 DIAGNOSIS — N2581 Secondary hyperparathyroidism of renal origin: Secondary | ICD-10-CM | POA: Diagnosis not present

## 2020-09-01 DIAGNOSIS — I5022 Chronic systolic (congestive) heart failure: Secondary | ICD-10-CM | POA: Diagnosis not present

## 2020-09-01 DIAGNOSIS — I10 Essential (primary) hypertension: Secondary | ICD-10-CM | POA: Diagnosis not present

## 2020-10-05 DIAGNOSIS — Z7901 Long term (current) use of anticoagulants: Secondary | ICD-10-CM | POA: Diagnosis not present

## 2020-11-08 DIAGNOSIS — Z7901 Long term (current) use of anticoagulants: Secondary | ICD-10-CM | POA: Diagnosis not present

## 2020-12-09 ENCOUNTER — Other Ambulatory Visit: Payer: Self-pay

## 2020-12-09 ENCOUNTER — Emergency Department: Payer: PPO

## 2020-12-09 ENCOUNTER — Encounter: Payer: Self-pay | Admitting: Emergency Medicine

## 2020-12-09 ENCOUNTER — Inpatient Hospital Stay
Admission: EM | Admit: 2020-12-09 | Discharge: 2020-12-16 | DRG: 291 | Disposition: A | Discharge status: Home / Self Care | Payer: PPO | Attending: Internal Medicine | Admitting: Internal Medicine

## 2020-12-09 DIAGNOSIS — I480 Paroxysmal atrial fibrillation: Secondary | ICD-10-CM | POA: Diagnosis not present

## 2020-12-09 DIAGNOSIS — Z6836 Body mass index (BMI) 36.0-36.9, adult: Secondary | ICD-10-CM | POA: Diagnosis not present

## 2020-12-09 DIAGNOSIS — T502X5A Adverse effect of carbonic-anhydrase inhibitors, benzothiadiazides and other diuretics, initial encounter: Secondary | ICD-10-CM | POA: Diagnosis not present

## 2020-12-09 DIAGNOSIS — N2581 Secondary hyperparathyroidism of renal origin: Secondary | ICD-10-CM | POA: Diagnosis present

## 2020-12-09 DIAGNOSIS — J441 Chronic obstructive pulmonary disease with (acute) exacerbation: Secondary | ICD-10-CM | POA: Diagnosis present

## 2020-12-09 DIAGNOSIS — I5043 Acute on chronic combined systolic (congestive) and diastolic (congestive) heart failure: Secondary | ICD-10-CM | POA: Diagnosis not present

## 2020-12-09 DIAGNOSIS — G9341 Metabolic encephalopathy: Secondary | ICD-10-CM | POA: Diagnosis present

## 2020-12-09 DIAGNOSIS — N1831 Chronic kidney disease, stage 3a: Secondary | ICD-10-CM | POA: Diagnosis present

## 2020-12-09 DIAGNOSIS — E118 Type 2 diabetes mellitus with unspecified complications: Secondary | ICD-10-CM | POA: Diagnosis present

## 2020-12-09 DIAGNOSIS — R609 Edema, unspecified: Secondary | ICD-10-CM | POA: Diagnosis not present

## 2020-12-09 DIAGNOSIS — K219 Gastro-esophageal reflux disease without esophagitis: Secondary | ICD-10-CM | POA: Diagnosis present

## 2020-12-09 DIAGNOSIS — I10 Essential (primary) hypertension: Secondary | ICD-10-CM | POA: Diagnosis present

## 2020-12-09 DIAGNOSIS — E1122 Type 2 diabetes mellitus with diabetic chronic kidney disease: Secondary | ICD-10-CM | POA: Diagnosis not present

## 2020-12-09 DIAGNOSIS — I5082 Biventricular heart failure: Secondary | ICD-10-CM | POA: Diagnosis not present

## 2020-12-09 DIAGNOSIS — I110 Hypertensive heart disease with heart failure: Secondary | ICD-10-CM | POA: Diagnosis not present

## 2020-12-09 DIAGNOSIS — I5023 Acute on chronic systolic (congestive) heart failure: Secondary | ICD-10-CM | POA: Diagnosis not present

## 2020-12-09 DIAGNOSIS — R0602 Shortness of breath: Secondary | ICD-10-CM | POA: Diagnosis not present

## 2020-12-09 DIAGNOSIS — E875 Hyperkalemia: Secondary | ICD-10-CM | POA: Diagnosis present

## 2020-12-09 DIAGNOSIS — N179 Acute kidney failure, unspecified: Secondary | ICD-10-CM | POA: Diagnosis present

## 2020-12-09 DIAGNOSIS — E785 Hyperlipidemia, unspecified: Secondary | ICD-10-CM | POA: Diagnosis not present

## 2020-12-09 DIAGNOSIS — J9621 Acute and chronic respiratory failure with hypoxia: Secondary | ICD-10-CM | POA: Diagnosis present

## 2020-12-09 DIAGNOSIS — J811 Chronic pulmonary edema: Secondary | ICD-10-CM | POA: Diagnosis not present

## 2020-12-09 DIAGNOSIS — Z20822 Contact with and (suspected) exposure to covid-19: Secondary | ICD-10-CM | POA: Diagnosis not present

## 2020-12-09 DIAGNOSIS — N183 Chronic kidney disease, stage 3 unspecified: Secondary | ICD-10-CM | POA: Diagnosis present

## 2020-12-09 DIAGNOSIS — J9811 Atelectasis: Secondary | ICD-10-CM | POA: Diagnosis not present

## 2020-12-09 DIAGNOSIS — Z66 Do not resuscitate: Secondary | ICD-10-CM | POA: Diagnosis present

## 2020-12-09 DIAGNOSIS — I5021 Acute systolic (congestive) heart failure: Secondary | ICD-10-CM | POA: Diagnosis not present

## 2020-12-09 DIAGNOSIS — R600 Localized edema: Secondary | ICD-10-CM | POA: Diagnosis not present

## 2020-12-09 DIAGNOSIS — E1165 Type 2 diabetes mellitus with hyperglycemia: Secondary | ICD-10-CM | POA: Diagnosis present

## 2020-12-09 DIAGNOSIS — J9622 Acute and chronic respiratory failure with hypercapnia: Secondary | ICD-10-CM | POA: Diagnosis not present

## 2020-12-09 DIAGNOSIS — Z880 Allergy status to penicillin: Secondary | ICD-10-CM

## 2020-12-09 DIAGNOSIS — Z79899 Other long term (current) drug therapy: Secondary | ICD-10-CM

## 2020-12-09 DIAGNOSIS — D631 Anemia in chronic kidney disease: Secondary | ICD-10-CM | POA: Diagnosis not present

## 2020-12-09 DIAGNOSIS — I2510 Atherosclerotic heart disease of native coronary artery without angina pectoris: Secondary | ICD-10-CM | POA: Diagnosis present

## 2020-12-09 DIAGNOSIS — R509 Fever, unspecified: Secondary | ICD-10-CM

## 2020-12-09 DIAGNOSIS — E8770 Fluid overload, unspecified: Secondary | ICD-10-CM | POA: Diagnosis not present

## 2020-12-09 DIAGNOSIS — E876 Hypokalemia: Secondary | ICD-10-CM | POA: Diagnosis not present

## 2020-12-09 DIAGNOSIS — Z7901 Long term (current) use of anticoagulants: Secondary | ICD-10-CM

## 2020-12-09 DIAGNOSIS — R053 Chronic cough: Secondary | ICD-10-CM | POA: Diagnosis not present

## 2020-12-09 DIAGNOSIS — R570 Cardiogenic shock: Secondary | ICD-10-CM | POA: Diagnosis present

## 2020-12-09 DIAGNOSIS — I5022 Chronic systolic (congestive) heart failure: Secondary | ICD-10-CM | POA: Diagnosis present

## 2020-12-09 DIAGNOSIS — I429 Cardiomyopathy, unspecified: Secondary | ICD-10-CM | POA: Diagnosis present

## 2020-12-09 DIAGNOSIS — Z87891 Personal history of nicotine dependence: Secondary | ICD-10-CM

## 2020-12-09 DIAGNOSIS — I509 Heart failure, unspecified: Secondary | ICD-10-CM | POA: Diagnosis not present

## 2020-12-09 DIAGNOSIS — N059 Unspecified nephritic syndrome with unspecified morphologic changes: Secondary | ICD-10-CM | POA: Diagnosis present

## 2020-12-09 DIAGNOSIS — J90 Pleural effusion, not elsewhere classified: Secondary | ICD-10-CM | POA: Diagnosis not present

## 2020-12-09 DIAGNOSIS — I130 Hypertensive heart and chronic kidney disease with heart failure and stage 1 through stage 4 chronic kidney disease, or unspecified chronic kidney disease: Secondary | ICD-10-CM | POA: Diagnosis not present

## 2020-12-09 DIAGNOSIS — I517 Cardiomegaly: Secondary | ICD-10-CM | POA: Diagnosis not present

## 2020-12-09 DIAGNOSIS — R4182 Altered mental status, unspecified: Secondary | ICD-10-CM | POA: Diagnosis not present

## 2020-12-09 DIAGNOSIS — R791 Abnormal coagulation profile: Secondary | ICD-10-CM | POA: Diagnosis present

## 2020-12-09 HISTORY — DX: Disorder of kidney and ureter, unspecified: N28.9

## 2020-12-09 HISTORY — DX: Essential (primary) hypertension: I10

## 2020-12-09 HISTORY — DX: Heart failure, unspecified: I50.9

## 2020-12-09 HISTORY — DX: Unspecified atrial fibrillation: I48.91

## 2020-12-09 HISTORY — DX: Atherosclerotic heart disease of native coronary artery without angina pectoris: I25.10

## 2020-12-09 LAB — CBC
HCT: 32.6 % — ABNORMAL LOW (ref 39.0–52.0)
Hemoglobin: 10 g/dL — ABNORMAL LOW (ref 13.0–17.0)
MCH: 27.7 pg (ref 26.0–34.0)
MCHC: 30.7 g/dL (ref 30.0–36.0)
MCV: 90.3 fL (ref 80.0–100.0)
Platelets: 122 10*3/uL — ABNORMAL LOW (ref 150–400)
RBC: 3.61 MIL/uL — ABNORMAL LOW (ref 4.22–5.81)
RDW: 15.4 % (ref 11.5–15.5)
WBC: 4.7 10*3/uL (ref 4.0–10.5)
nRBC: 0 % (ref 0.0–0.2)

## 2020-12-09 LAB — BASIC METABOLIC PANEL
Anion gap: 5 (ref 5–15)
BUN: 14 mg/dL (ref 8–23)
CO2: 30 mmol/L (ref 22–32)
Calcium: 8.7 mg/dL — ABNORMAL LOW (ref 8.9–10.3)
Chloride: 103 mmol/L (ref 98–111)
Creatinine, Ser: 1.45 mg/dL — ABNORMAL HIGH (ref 0.61–1.24)
GFR, Estimated: 52 mL/min — ABNORMAL LOW (ref >60)
Glucose, Bld: 220 mg/dL — ABNORMAL HIGH (ref 70–99)
Potassium: 3.7 mmol/L (ref 3.5–5.1)
Sodium: 138 mmol/L (ref 135–145)

## 2020-12-09 LAB — BRAIN NATRIURETIC PEPTIDE: B Natriuretic Peptide: 590.9 pg/mL — ABNORMAL HIGH (ref 0.0–100.0)

## 2020-12-09 LAB — RESP PANEL BY RT-PCR (FLU A&B, COVID) ARPGX2
Influenza A by PCR: NEGATIVE
Influenza B by PCR: NEGATIVE
SARS Coronavirus 2 by RT PCR: NEGATIVE

## 2020-12-09 LAB — TROPONIN I (HIGH SENSITIVITY)
Troponin I (High Sensitivity): 6 ng/L (ref <18)
Troponin I (High Sensitivity): 8 ng/L (ref <18)

## 2020-12-09 LAB — PROTIME-INR
INR: 3.6 — ABNORMAL HIGH (ref 0.8–1.2)
Prothrombin Time: 35.7 seconds — ABNORMAL HIGH (ref 11.4–15.2)

## 2020-12-09 MED ORDER — HYDRALAZINE HCL 50 MG PO TABS
50.0000 mg | ORAL_TABLET | Freq: Four times a day (QID) | ORAL | Status: DC
  Filled 2020-12-09: qty 1

## 2020-12-09 MED ORDER — SODIUM CHLORIDE 0.9% FLUSH
3.0000 mL | Freq: Two times a day (BID) | INTRAVENOUS | Status: DC
  Administered 2020-12-10 – 2020-12-16 (×11): 3 mL via INTRAVENOUS

## 2020-12-09 MED ORDER — SODIUM CHLORIDE 0.9% FLUSH: 3.0000 mL | INTRAVENOUS | Status: DC | PRN

## 2020-12-09 MED ORDER — POTASSIUM CHLORIDE CRYS ER 20 MEQ PO TBCR
20.0000 meq | EXTENDED_RELEASE_TABLET | Freq: Every day | ORAL | Status: DC
  Filled 2020-12-09: qty 1

## 2020-12-09 MED ORDER — ONDANSETRON HCL 4 MG/2ML IJ SOLN: 4.0000 mg | Freq: Four times a day (QID) | INTRAMUSCULAR | Status: DC | PRN

## 2020-12-09 MED ORDER — INSULIN ASPART 100 UNIT/ML IJ SOLN
0.0000 [IU] | Freq: Every day | INTRAMUSCULAR | Status: DC
  Administered 2020-12-13: 3 [IU] via SUBCUTANEOUS
  Administered 2020-12-14: 5 [IU] via SUBCUTANEOUS
  Administered 2020-12-15: 2 [IU] via SUBCUTANEOUS
  Filled 2020-12-09 (×3): qty 1

## 2020-12-09 MED ORDER — FUROSEMIDE 10 MG/ML IJ SOLN
80.0000 mg | Freq: Once | INTRAMUSCULAR | Status: AC
  Administered 2020-12-09: 80 mg via INTRAVENOUS
  Filled 2020-12-09: qty 8

## 2020-12-09 MED ORDER — SODIUM CHLORIDE 0.9 % IV SOLN: 250.0000 mL | INTRAVENOUS | Status: DC | PRN

## 2020-12-09 MED ORDER — ACETAMINOPHEN 325 MG PO TABS: 650.0000 mg | ORAL_TABLET | ORAL | Status: DC | PRN

## 2020-12-09 MED ORDER — CARVEDILOL 25 MG PO TABS: 25.0000 mg | ORAL_TABLET | Freq: Two times a day (BID) | ORAL | Status: DC

## 2020-12-09 MED ORDER — WARFARIN - PHARMACIST DOSING INPATIENT
Freq: Every day | Status: DC
  Filled 2020-12-09: qty 1

## 2020-12-09 MED ORDER — FUROSEMIDE 10 MG/ML IJ SOLN
40.0000 mg | Freq: Two times a day (BID) | INTRAMUSCULAR | Status: DC
  Filled 2020-12-09: qty 4

## 2020-12-09 MED ORDER — PRAVASTATIN SODIUM 20 MG PO TABS
20.0000 mg | ORAL_TABLET | Freq: Every day | ORAL | Status: DC
  Administered 2020-12-11 – 2020-12-15 (×5): 20 mg via ORAL
  Filled 2020-12-09 (×5): qty 1

## 2020-12-09 MED ORDER — CALCITRIOL 0.25 MCG PO CAPS
0.2500 ug | ORAL_CAPSULE | ORAL | Status: DC
  Administered 2020-12-12 – 2020-12-16 (×3): 0.25 ug via ORAL
  Filled 2020-12-09 (×5): qty 1

## 2020-12-09 MED ORDER — INSULIN ASPART 100 UNIT/ML IJ SOLN
0.0000 [IU] | Freq: Three times a day (TID) | INTRAMUSCULAR | Status: DC
  Administered 2020-12-10: 2 [IU] via SUBCUTANEOUS
  Administered 2020-12-10 – 2020-12-11 (×2): 3 [IU] via SUBCUTANEOUS
  Administered 2020-12-11: 5 [IU] via SUBCUTANEOUS
  Administered 2020-12-11: 3 [IU] via SUBCUTANEOUS
  Administered 2020-12-12: 5 [IU] via SUBCUTANEOUS
  Administered 2020-12-12 (×2): 3 [IU] via SUBCUTANEOUS
  Administered 2020-12-13: 8 [IU] via SUBCUTANEOUS
  Administered 2020-12-13 (×2): 3 [IU] via SUBCUTANEOUS
  Administered 2020-12-14: 8 [IU] via SUBCUTANEOUS
  Administered 2020-12-14: 2 [IU] via SUBCUTANEOUS
  Administered 2020-12-14: 11 [IU] via SUBCUTANEOUS
  Administered 2020-12-15: 8 [IU] via SUBCUTANEOUS
  Administered 2020-12-15 (×2): 5 [IU] via SUBCUTANEOUS
  Administered 2020-12-16 (×2): 3 [IU] via SUBCUTANEOUS
  Filled 2020-12-09 (×19): qty 1

## 2020-12-09 MED ORDER — ENALAPRIL MALEATE 20 MG PO TABS
20.0000 mg | ORAL_TABLET | Freq: Every day | ORAL | Status: DC
  Filled 2020-12-09: qty 1

## 2020-12-09 NOTE — ED Notes (Signed)
Placed on 2lnc

## 2020-12-09 NOTE — ED Triage Notes (Signed)
C/O sob and lower extremity swelling for the past several months.  AAOx3,  skin warm and dry. No SOB/ DOE noted. NAD

## 2020-12-09 NOTE — Consult Note (Signed)
ANTICOAGULATION CONSULT NOTE - Initial Consult  Pharmacy Consult for warfarin Indication: atrial fibrillation  Allergies  Allergen Reactions   Penicillin V     Other reaction(s): Unknown Other reaction(s): Unknown As a child    Penicillins     Patient Measurements:     Vital Signs: Temp: 98.7 F (37.1 C) (08/26 1440) BP: 144/97 (08/26 1700) Pulse Rate: 110 (08/26 1700)  Labs: Recent Labs    12/09/20 1445 12/09/20 1711  HGB 10.0*  --   HCT 32.6*  --   PLT 122*  --   LABPROT 35.7*  --   INR 3.6*  --   CREATININE 1.45*  --   TROPONINIHS  --  6    CrCl cannot be calculated (Unknown ideal weight.).   Medical History: Past Medical History:  Diagnosis Date   A-fib (HCC)    CHF (congestive heart failure) (HCC)    Coronary artery disease    Hypertension    Renal disorder     Medications:  Chronic Warfarin regimen: 4 mg daily (TWD = 28 mg)  Assessment: 70 y.o. male with medical history paroxysmal atrial fibrillation stable on chronic warfarin regimen as above. Presented to the ER with progressive shortness of breath lower extremity swelling, orthopnea and PND over the last week. INR supratherapeutic (3.6) on admission. No new outpatient medication identified (patient not taking prednisone -- verified with patient/wife) possibly 2/2 fluid overload/congestion?  Date INR Warfarin Dose  8/25 -- 4 mg (PTA) 8/26 3.6 Hold   Goal of Therapy:  INR 2-3 Monitor platelets by anticoagulation protocol: Yes   Plan:  INR 3.6 (supratherapeutic) on admission Withhold dose tonight INR daily   Dorothe Pea, PharmD, BCPS Clinical Pharmacist   12/09/2020,7:44 PM

## 2020-12-09 NOTE — ED Provider Notes (Signed)
[facility] Emergency Department Provider Note ____________________________________________   Event Date/Time   First MD Initiated Contact with Patient 12/09/20 1649     (approximate)  I have reviewed the triage vital signs and the nursing notes.   HISTORY  Chief Complaint Shortness of Breath    HPI Timothy Chavez is a 70 y.o. male with PMH as noted below including CHF, A. fib, hypertension, and chronic kidney disease who presents with worsening shortness of breath over approximately the last month, acutely worsened in the last several days, associated with orthopnea and with bilateral lower extremity swelling.  The patient states he gets short of breath with speaking and with minimal exertion.  He is on Lasix 40 mg daily and states he has been compliant.  He is not on oxygen at home.  Past Medical History:  Diagnosis Date   A-fib [facility])    CHF (congestive heart failure) (HCC)    Coronary artery disease    Hypertension    Renal disorder     There are no problems to display for this patient.   Prior to Admission medications   Not on File    Allergies Penicillins  No family history on file.  Social History Social History   Tobacco Use   Smoking status: Never    Passive exposure: Never   Smokeless tobacco: Never  Substance Use Topics   Alcohol use: Not Currently   Drug use: Not Currently    Review of Systems  Constitutional: No fever/chills Eyes: No visual changes. ENT: No sore throat. Cardiovascular: Denies chest pain. Respiratory: Positive for shortness of breath. Gastrointestinal: No vomiting or diarrhea.  Genitourinary: Negative for dysuria.  Musculoskeletal: Negative for back pain.  Positive for edema. Skin: Negative for rash. Neurological: Negative for headaches, focal weakness or numbness.   ____________________________________________   PHYSICAL EXAM:  VITAL SIGNS: ED Triage Vitals  Enc Vitals Group     BP  12/09/20 1440 (!) 147/73     Pulse Rate 12/09/20 1444 (!) 103     Resp 12/09/20 1440 18     Temp 12/09/20 1440 98.7 F (37.1 C)     Temp src --      SpO2 12/09/20 1440 (!) 87 %     Weight --      Height --      Head Circumference --      Peak Flow --      Pain Score 12/09/20 1435 0     Pain Loc --      Pain Edu? --      Excl. in [place]? --     Constitutional: Alert and oriented.  Chronically ill-appearing but in no acute distress. Eyes: Conjunctivae are normal.  Head: Atraumatic. Nose: No congestion/rhinnorhea. Mouth/Throat: Mucous membranes are moist.   Neck: Normal range of motion.  Cardiovascular: Normal rate, regular rhythm.Good peripheral circulation. Respiratory: Increased respiratory effort, speaking in short sentences.  No retractions.  Decreased breath sounds and some rales to bilateral bases. Gastrointestinal: Soft and nontender. No distention.  Genitourinary: No flank tenderness. Musculoskeletal: 2+ bilateral lower extremity edema.  Extremities warm and well perfused.  Neurologic:  Normal speech and language. No gross focal neurologic deficits are appreciated.  Skin:  Skin is warm and dry. No rash noted. Psychiatric: Mood and affect are normal. Speech and behavior are normal.  ____________________________________________   LABS (all labs ordered are listed, but only abnormal results are displayed)  Labs Reviewed  BASIC METABOLIC PANEL - Abnormal; Notable  for the following components:      Result Value   Glucose, Bld 220 (*)    Creatinine, Ser 1.45 (*)    Calcium 8.7 (*)    GFR, Estimated 52 (*)    All other components within normal limits  CBC - Abnormal; Notable for the following components:   RBC 3.61 (*)    Hemoglobin 10.0 (*)    HCT 32.6 (*)    Platelets 122 (*)    All other components within normal limits  PROTIME-INR - Abnormal; Notable for the following components:   Prothrombin Time 35.7 (*)    INR 3.6 (*)    All other components within normal  limits  BRAIN NATRIURETIC PEPTIDE - Abnormal; Notable for the following components:   B Natriuretic Peptide 590.9 (*)    All other components within normal limits  RESP PANEL BY RT-PCR (FLU A&B, COVID) [id number]  TROPONIN I (HIGH SENSITIVITY)  TROPONIN I (HIGH SENSITIVITY)   ____________________________________________  EKG  ED ECG REPORT I, Arta Silence, the attending physician, personally viewed and interpreted this ECG.  Date: 12/09/2020 EKG Time: 1443 Rate: 92 Rhythm: Atrial fibrillation QRS Axis: normal Intervals: normal ST/T Wave abnormalities: normal Narrative Interpretation: no evidence of acute ischemia  ____________________________________________  RADIOLOGY  Chest x-ray interpreted by me shows bilateral pulmonary edema and right pleural effusion  ____________________________________________   PROCEDURES  Procedure(s) performed: No  Procedures  Critical Care performed: No ____________________________________________   INITIAL IMPRESSION / ASSESSMENT AND PLAN / ED COURSE  Pertinent labs & imaging results that were available during my care of the patient were reviewed by me and considered in my medical decision making (see chart for details).   70 year old male with PMH as noted above including atrial fibrillation, CHF, and chronic kidney disease presents with worsening shortness of breath and edema over the last month, worse in the last few days to the point where the patient cannot speak in full sentences.   I reviewed the past medical records in [place].  The patient has no recent prior ED visits or admissions.  On exam the patient is uncomfortable appearing with increased work of breathing, speaking in short sentences.  O2 saturation was in the mid to high 80s on room air and is in the low 90s on 4 L by nasal cannula.  His other vital signs are normal.  Exam is otherwise as described above with significant peripheral edema and diminished breath sounds  bilaterally.  Chest x-ray reveals bilateral pulmonary edema and a right pleural effusion.  Overall presentation is most consistent with CHF versus fluid accumulation due to CKD.  I have a low suspicion for pneumonia or other acute infectious etiology, and there is no clinical evidence for PE given the gradual onset of the symptoms, lack of chest pain, and the x-ray findings.  We will keep the patient on oxygen, give IV Lasix, obtain additional lab work-up, and plan for admission.  ----------------------------------------- 6:25 PM on 12/09/2020 -----------------------------------------  BNP is elevated.  Troponin is negative.  The COVID is negative.  IV Lasix has been initiated.  I consulted Dr. Jonelle Sidle from the hospitalist service for admission.  ____________________________________________   FINAL CLINICAL IMPRESSION(S) / ED DIAGNOSES  Final diagnoses:  Acute on chronic respiratory failure with hypoxia (HCC)  Acute on chronic congestive heart failure, unspecified heart failure type ([place])      NEW MEDICATIONS STARTED DURING THIS VISIT:  New Prescriptions   No medications on file     Note:  This  document was prepared using Systems analyst and may include unintentional dictation errors.    Arta Silence, MD 12/09/20 [phone]

## 2020-12-09 NOTE — H&P (Signed)
History and Physical   Timothy Chavez [id number] DOB: 28-Apr-1950 DOA: 12/09/2020  Referring MD/NP/PA: Dr. Cherylann Banas  PCP: Idelle Crouch, MD   Outpatient Specialists: Dr. Ubaldo Glassing, cardiology, Dr. Zollie Scale, nephrologist  Patient coming from: Home  Chief Complaint: Shortness of breath  HPI: Timothy Chavez is a 70 y.o. male with medical history significant of systolic dysfunction CHF with EF of 45% from echocardiogram on May 10, 2020, paroxysmal atrial fibrillation, coronary artery disease, chronic kidney disease stage III, essential hypertension, GERD, and diabetes type 2 who presented to the ER with progressive shortness of breath lower extremity swelling, orthopnea and PND over the last week.  Patient is on 40 mg of prednisone daily.  He apparently has been taking his medications including other cardiac medicines but has noted a worsening shortness of breath and symptoms.  He called out to his cardiologist office with recommendations for him to come to the ER.  Denies any sick contacts.  Denied any nausea vomiting or diarrhea.  His shortness of breath is at rest but worse with activities.  Patient is not on oxygen at home but he is hypoxic with sats in the upper 80s.  He is therefore being admitted to the hospital for treatment of acute on chronic CHF exacerbation..  ED Course: Temperature is 98.7 blood pressure 147/73.  Pulse is 110 respirate of 18 oxygen sat 87% on room air currently on 4 L 95%.  White count 4.7 hemoglobin 10.1 platelets 122.  Sodium 138 potassium 3.7 chloride 103 CO2 30 BUN 14 creatinine 1.45 and calcium 8.7.  INR is 3.6 and glucose 220.  BNP of 590.  Initial troponin is 6.  Acute viral screen including influenza negative.  Chest x-ray showed bilateral pleural effusion and pulmonary edema.  Patient is therefore being admitted with acute exacerbation of CHF.  Review of Systems: As per HPI otherwise 10 point review of systems negative.    Past Medical History:  Diagnosis  Date   A-fib [facility])    CHF (congestive heart failure) ([place])    Coronary artery disease    Hypertension    Renal disorder     History reviewed. No pertinent surgical history.   reports that he has never smoked. He has never been exposed to tobacco smoke. He has never used smokeless tobacco. He reports that he does not currently use alcohol. He reports that he does not currently use drugs.  Allergies  Allergen Reactions   Penicillin V     Other reaction(s): Unknown Other reaction(s): Unknown As a child    Penicillins     History reviewed. No pertinent family history.   Prior to Admission medications   Medication Sig Start Date End Date Taking? Authorizing Provider  amLODipine (NORVASC) 10 MG tablet Take 10 mg by mouth daily. 10/26/20   [provider]  calcitRIOL (ROCALTROL) 0.25 MCG capsule Take 1 capsule by mouth 3 (three) times a week. 12/06/20   [provider]  carvedilol (COREG) 25 MG tablet Take 25 mg by mouth 2 (two) times daily. 10/26/20   [provider]  enalapril (VASOTEC) 20 MG tablet Take 20 mg by mouth daily. 10/26/20   [provider]  furosemide (LASIX) 40 MG tablet Take 40 mg by mouth daily. 10/26/20   [provider]  hydrALAZINE (APRESOLINE) 50 MG tablet Take 2 tablets by mouth 2 (two) times daily. 10/26/20   [provider]  potassium chloride SA (KLOR-CON) 20 MEQ tablet Take 20 mEq by mouth daily. 10/26/20  [provider]  pravastatin (PRAVACHOL) 20 MG tablet Take 20 mg by mouth daily as needed. 10/26/20   [provider]  warfarin (COUMADIN) 4 MG tablet Take 4 mg by mouth daily. 10/26/20   [provider]    Physical Exam: Vitals:   12/09/20 1440 12/09/20 1444 12/09/20 1700  BP: (!) 147/73  (!) 144/97  Pulse:  (!) 103 (!) 110  Resp: '18 18 17  '$ Temp: 98.7 F (37.1 C)    SpO2: (!) 87% 95% 91%      Constitutional: Acutely ill looking, anasarca Vitals:   12/09/20 1440 12/09/20  1444 12/09/20 1700  BP: (!) 147/73  (!) 144/97  Pulse:  (!) 103 (!) 110  Resp: '18 18 17  '$ Temp: 98.7 F (37.1 C)    SpO2: (!) 87% 95% 91%   Eyes: PERRL, lids and conjunctivae normal ENMT: Mucous membranes are moist. Posterior pharynx clear of any exudate or lesions.Normal dentition.  Neck: normal, supple, no masses, no thyromegaly Respiratory: Decreased air entry bilaterally with diffuse crackles, increased respiratory effort. No accessory muscle use.  Cardiovascular: Regular rate and rhythm, no murmurs / rubs / gallops.  2+ extremity edema. 2+ pedal pulses. No carotid bruits.  Abdomen: no tenderness, no masses palpated. No hepatosplenomegaly. Bowel sounds positive.  Musculoskeletal: no clubbing / cyanosis. No joint deformity upper and lower extremities. Good ROM, no contractures. Normal muscle tone.  Skin: no rashes, lesions, ulcers. No induration Neurologic: CN 2-12 grossly intact. Sensation intact, DTR normal. Strength 5/5 in all 4.  Psychiatric: Normal judgment and insight. Alert and oriented x 3. Normal mood.     Labs on Admission: I have personally reviewed following labs and imaging studies  CBC: Recent Labs  Lab 12/09/20 1445  WBC 4.7  HGB 10.0*  HCT 32.6*  MCV 90.3  PLT 123XX123*   Basic Metabolic Panel: Recent Labs  Lab 12/09/20 1445  NA 138  K 3.7  CL 103  CO2 30  GLUCOSE 220*  BUN 14  CREATININE 1.45*  CALCIUM 8.7*   GFR: CrCl cannot be calculated (Unknown ideal weight.). Liver Function Tests: No results for input(s): AST, ALT, ALKPHOS, BILITOT, PROT, ALBUMIN in the last 168 hours. No results for input(s): LIPASE, AMYLASE in the last 168 hours. No results for input(s): AMMONIA in the last 168 hours. Coagulation Profile: Recent Labs  Lab 12/09/20 1445  INR 3.6*   Cardiac Enzymes: No results for input(s): CKTOTAL, CKMB, CKMBINDEX, TROPONINI in the last 168 hours. BNP (last 3 results) No results for input(s): PROBNP in the last 8760 hours. HbA1C: No  results for input(s): HGBA1C in the last 72 hours. CBG: No results for input(s): GLUCAP in the last 168 hours. Lipid Profile: No results for input(s): CHOL, HDL, LDLCALC, TRIG, CHOLHDL, LDLDIRECT in the last 72 hours. Thyroid Function Tests: No results for input(s): TSH, T4TOTAL, FREET4, T3FREE, THYROIDAB in the last 72 hours. Anemia Panel: No results for input(s): VITAMINB12, FOLATE, FERRITIN, TIBC, IRON, RETICCTPCT in the last 72 hours. Urine analysis: No results found for: COLORURINE, APPEARANCEUR, LABSPEC, PHURINE, GLUCOSEU, HGBUR, BILIRUBINUR, KETONESUR, PROTEINUR, UROBILINOGEN, NITRITE, LEUKOCYTESUR Sepsis Labs: '@LABRCNTIP'$ (procalcitonin:4,lacticidven:4) ) Recent Results (from the past 240 hour(s))  Resp Panel by RT-PCR (Flu A&B, Covid) Nasopharyngeal Swab     Status: None   Collection Time: 12/09/20  5:11 PM   Specimen: Nasopharyngeal Swab; Nasopharyngeal(NP) swabs in vial transport medium  Result Value Ref Range Status   SARS Coronavirus 2 by RT PCR NEGATIVE NEGATIVE Final    Comment: (NOTE)  SARS-CoV-2 target nucleic acids are NOT DETECTED.  The SARS-CoV-2 RNA is generally detectable in upper respiratory specimens during the acute phase of infection. The lowest concentration of SARS-CoV-2 viral copies this assay can detect is 138 copies/mL. A negative result does not preclude SARS-Cov-2 infection and should not be used as the sole basis for treatment or other patient management decisions. A negative result may occur with  improper specimen collection/handling, submission of specimen other than nasopharyngeal swab, presence of viral mutation(s) within the areas targeted by this assay, and inadequate number of viral copies(<138 copies/mL). A negative result must be combined with clinical observations, patient history, and epidemiological information. The expected result is Negative.  Fact Sheet for Patients:  EntrepreneurPulse.com.au  Fact Sheet for  Healthcare Providers:  IncredibleEmployment.be  This test is no t yet approved or cleared by the [place] FDA and  has been authorized for detection and/or diagnosis of SARS-CoV-2 by FDA under an Emergency Use Authorization (EUA). This EUA will remain  in effect (meaning this test can be used) for the duration of the COVID-19 declaration under Section 564(b)(1) of the Act, 21 U.S.C.section 360bbb-3(b)(1), unless the authorization is terminated  or revoked sooner.       Influenza A by PCR NEGATIVE NEGATIVE Final   Influenza B by PCR NEGATIVE NEGATIVE Final    Comment: (NOTE) The Xpert Xpress SARS-CoV-2/FLU/RSV plus assay is intended as an aid in the diagnosis of influenza from Nasopharyngeal swab specimens and should not be used as a sole basis for treatment. Nasal washings and aspirates are unacceptable for Xpert Xpress SARS-CoV-2/FLU/RSV testing.  Fact Sheet for Patients: EntrepreneurPulse.com.au  Fact Sheet for Healthcare Providers: IncredibleEmployment.be  This test is not yet approved or cleared by the [place] FDA and has been authorized for detection and/or diagnosis of SARS-CoV-2 by FDA under an Emergency Use Authorization (EUA). This EUA will remain in effect (meaning this test can be used) for the duration of the COVID-19 declaration under Section 564(b)(1) of the Act, 21 U.S.C. section 360bbb-3(b)(1), unless the authorization is terminated or revoked.  Performed at [facility], [address], [place], [place] [address]      Radiological Exams on Admission: DG Chest 2 View  Result Date: 12/09/2020 CLINICAL DATA:  Shortness of breath, lower extremity swelling. Chronic cough. EXAM: CHEST - 2 VIEW COMPARISON:  Chest radiograph 04/27/2008 FINDINGS: Small to moderate right pleural effusion with right lower lobe atelectasis. Diffuse hazy interstitial thickening, consistent with pulmonary edema.  No pneumothorax. Heart size is difficult to evaluate due to adjacent consolidation but appears enlarged, stable. Mildly tortuous thoracic aorta with calcific atherosclerosis. No acute osseous abnormality. IMPRESSION: Pulmonary edema with small to moderate right pleural effusion. Stable cardiomegaly. Electronically Signed   By: Ileana Roup M.D.   On: 12/09/2020 15:27    EKG: Independently reviewed.  Shows low voltage EKG, A. fib with a rate of 92, no other significant findings  Assessment/Plan Principal Problem:   Acute on chronic combined systolic and diastolic CHF, NYHA class 3 (HCC) Active Problems:   Benign essential hypertension   CAD (coronary artery disease), native coronary artery   Chronic kidney disease, stage III (moderate) (HCC)   Chronic systolic heart failure (HCC)   Current use of long term anticoagulation   GERD (gastroesophageal reflux disease)   Hyperlipidemia   Hypertension   PAF (paroxysmal atrial fibrillation) ([place])   Secondary hyperparathyroidism of renal origin ([place])   Type II diabetes mellitus with complication ([place])     #1  acute on chronic systolic dysfunction CHF: Patient will be admitted to telemetry unit.  Increase diuresis with 40 mg of Lasix IV twice daily being careful due to his chronic kidney disease.  Obtain another echocardiogram.  Obtain cardiology consult in the morning.  Cycle his enzymes.  Continue beta-blockers, continue ACEI although it will likely worsen renal function.  If that happens 1 discontinue.  Place of oxygen and titrate up.  #2 coronary artery disease: No evidence of coronary artery decompensation.  Continue to cycle enzymes and follow echocardiogram.  #3 diabetes: Initiate sliding scale insulin.  #4 essential hypertension: Blood pressure appears reasonably controlled at the moment.  I will hold amlodipine and home diuretic.  Continue carvedilol and enalapril.  #5 hyperlipidemia: Continue pravastatin.  #6 atrial fibrillation: Rate is  controlled.  Continue carvedilol and warfarin  #7 GERD: Not on PPIs at home.  Can order as needed.   DVT prophylaxis: Warfarin Code Status: Full code Family Communication: Wife at bedside Disposition Plan: Home Consults called: None but consult Dr. Ubaldo Glassing in the morning Admission status: Inpatient  Severity of Illness: The appropriate patient status for this patient is INPATIENT. Inpatient status is judged to be reasonable and necessary in order to provide the required intensity of service to ensure the patient's safety. The patient's presenting symptoms, physical exam findings, and initial radiographic and laboratory data in the context of their chronic comorbidities is felt to place them at high risk for further clinical deterioration. Furthermore, it is not anticipated that the patient will be medically stable for discharge from the hospital within 2 midnights of admission. The following factors support the patient status of inpatient.   " The patient's presenting symptoms include shortness of breath. " The worrisome physical exam findings include bilateral pedal edema with crackles. " The initial radiographic and laboratory data are worrisome because of pulmonary edema. " The chronic co-morbidities include CHF.   * I certify that at the point of admission it is my clinical judgment that the patient will require inpatient hospital care spanning beyond 2 midnights from the point of admission due to high intensity of service, high risk for further deterioration and high frequency of surveillance required.Barbette Merino MD [facility] Pager [phone]  If 7PM-7AM, please contact night-coverage [email or web address] Password [facility]  12/09/2020, 7:36 PM

## 2020-12-09 NOTE — Plan of Care (Signed)

## 2020-12-10 ENCOUNTER — Inpatient Hospital Stay
Admit: 2020-12-10 | Discharge: 2020-12-10 | Disposition: A | Discharge status: Home / Self Care | Payer: PPO | Attending: Internal Medicine | Admitting: Internal Medicine

## 2020-12-10 ENCOUNTER — Inpatient Hospital Stay: Payer: PPO

## 2020-12-10 DIAGNOSIS — I5043 Acute on chronic combined systolic (congestive) and diastolic (congestive) heart failure: Secondary | ICD-10-CM

## 2020-12-10 LAB — MRSA NEXT GEN BY PCR, NASAL: MRSA by PCR Next Gen: NOT DETECTED

## 2020-12-10 LAB — URINALYSIS, COMPLETE (UACMP) WITH MICROSCOPIC
Bilirubin Urine: NEGATIVE
Glucose, UA: NEGATIVE mg/dL
Hgb urine dipstick: NEGATIVE
Ketones, ur: NEGATIVE mg/dL
Leukocytes,Ua: NEGATIVE
Nitrite: NEGATIVE
Protein, ur: 30 mg/dL — AB
Specific Gravity, Urine: 1.013 (ref 1.005–1.030)
pH: 5 (ref 5.0–8.0)

## 2020-12-10 LAB — BASIC METABOLIC PANEL
Anion gap: 5 (ref 5–15)
Anion gap: 7 (ref 5–15)
BUN: 18 mg/dL (ref 8–23)
BUN: 30 mg/dL — ABNORMAL HIGH (ref 8–23)
CO2: 32 mmol/L (ref 22–32)
CO2: 32 mmol/L (ref 22–32)
Calcium: 8.4 mg/dL — ABNORMAL LOW (ref 8.9–10.3)
Calcium: 8.3 mg/dL — ABNORMAL LOW (ref 8.9–10.3)
Chloride: 102 mmol/L (ref 98–111)
Chloride: 100 mmol/L (ref 98–111)
Creatinine, Ser: 1.66 mg/dL — ABNORMAL HIGH (ref 0.61–1.24)
Creatinine, Ser: 2.53 mg/dL — ABNORMAL HIGH (ref 0.61–1.24)
GFR, Estimated: 45 mL/min — ABNORMAL LOW (ref >60)
GFR, Estimated: 27 mL/min — ABNORMAL LOW (ref >60)
Glucose, Bld: 170 mg/dL — ABNORMAL HIGH (ref 70–99)
Glucose, Bld: 122 mg/dL — ABNORMAL HIGH (ref 70–99)
Potassium: 4.6 mmol/L (ref 3.5–5.1)
Potassium: 5.5 mmol/L — ABNORMAL HIGH (ref 3.5–5.1)
Sodium: 139 mmol/L (ref 135–145)
Sodium: 139 mmol/L (ref 135–145)

## 2020-12-10 LAB — GLUCOSE, CAPILLARY
Glucose-Capillary: 158 mg/dL — ABNORMAL HIGH (ref 70–99)
Glucose-Capillary: 136 mg/dL — ABNORMAL HIGH (ref 70–99)
Glucose-Capillary: 102 mg/dL — ABNORMAL HIGH (ref 70–99)
Glucose-Capillary: 101 mg/dL — ABNORMAL HIGH (ref 70–99)
Glucose-Capillary: 123 mg/dL — ABNORMAL HIGH (ref 70–99)
Glucose-Capillary: 136 mg/dL — ABNORMAL HIGH (ref 70–99)

## 2020-12-10 LAB — BLOOD GAS, ARTERIAL
Acid-Base Excess: 4.2 mmol/L — ABNORMAL HIGH (ref 0.0–2.0)
Acid-Base Excess: 3.3 mmol/L — ABNORMAL HIGH (ref 0.0–2.0)
Bicarbonate: 35.2 mmol/L — ABNORMAL HIGH (ref 20.0–28.0)
Bicarbonate: 32.1 mmol/L — ABNORMAL HIGH (ref 20.0–28.0)
Expiratory PAP: 6
FIO2: 40
FIO2: 100
Inspiratory PAP: 18
Mechanical Rate: 16
O2 Saturation: 81.3 %
O2 Saturation: 94.1 %
Patient temperature: 37
Patient temperature: 37
pCO2 arterial: 101 mmHg (ref 32.0–48.0)
pCO2 arterial: 75 mmHg (ref 32.0–48.0)
pH, Arterial: 7.15 — CL (ref 7.350–7.450)
pH, Arterial: 7.24 — ABNORMAL LOW (ref 7.350–7.450)
pO2, Arterial: 59 mmHg — ABNORMAL LOW (ref 83.0–108.0)
pO2, Arterial: 83 mmHg (ref 83.0–108.0)

## 2020-12-10 LAB — PROCALCITONIN: Procalcitonin: 5.34 ng/mL

## 2020-12-10 LAB — CBC
HCT: 34.4 % — ABNORMAL LOW (ref 39.0–52.0)
Hemoglobin: 9.9 g/dL — ABNORMAL LOW (ref 13.0–17.0)
MCH: 27.5 pg (ref 26.0–34.0)
MCHC: 28.8 g/dL — ABNORMAL LOW (ref 30.0–36.0)
MCV: 95.6 fL (ref 80.0–100.0)
Platelets: 104 10*3/uL — ABNORMAL LOW (ref 150–400)
RBC: 3.6 MIL/uL — ABNORMAL LOW (ref 4.22–5.81)
RDW: 15.9 % — ABNORMAL HIGH (ref 11.5–15.5)
WBC: 10.1 10*3/uL (ref 4.0–10.5)
nRBC: 0 % (ref 0.0–0.2)

## 2020-12-10 LAB — HEMOGLOBIN A1C
Hgb A1c MFr Bld: 6.8 % — ABNORMAL HIGH (ref 4.8–5.6)
Mean Plasma Glucose: 148.46 mg/dL

## 2020-12-10 LAB — ECHOCARDIOGRAM COMPLETE
AR max vel: 1.53 cm2
AV Peak grad: 5.9 mmHg
Ao pk vel: 1.21 m/s
Area-P 1/2: 4.93 cm2
Height: 72 in
S' Lateral: 3.7 cm
Weight: 4478.41 oz

## 2020-12-10 LAB — PROTIME-INR
INR: 3.6 — ABNORMAL HIGH (ref 0.8–1.2)
Prothrombin Time: 35.6 seconds — ABNORMAL HIGH (ref 11.4–15.2)

## 2020-12-10 LAB — LACTIC ACID, PLASMA
Lactic Acid, Venous: 1.7 mmol/L (ref 0.5–1.9)
Lactic Acid, Venous: 1.5 mmol/L (ref 0.5–1.9)

## 2020-12-10 LAB — HIV ANTIBODY (ROUTINE TESTING W REFLEX): HIV Screen 4th Generation wRfx: NONREACTIVE

## 2020-12-10 LAB — POTASSIUM: Potassium: 5.2 mmol/L — ABNORMAL HIGH (ref 3.5–5.1)

## 2020-12-10 MED ORDER — BUDESONIDE 0.5 MG/2ML IN SUSP
0.5000 mg | Freq: Two times a day (BID) | RESPIRATORY_TRACT | Status: DC
  Administered 2020-12-10 – 2020-12-16 (×12): 0.5 mg via RESPIRATORY_TRACT
  Filled 2020-12-10 (×12): qty 2

## 2020-12-10 MED ORDER — LEVALBUTEROL HCL 1.25 MG/0.5ML IN NEBU
1.2500 mg | INHALATION_SOLUTION | Freq: Four times a day (QID) | RESPIRATORY_TRACT | Status: DC | PRN
  Filled 2020-12-10: qty 0.5

## 2020-12-10 MED ORDER — CHLORHEXIDINE GLUCONATE CLOTH 2 % EX PADS
6.0000 | MEDICATED_PAD | Freq: Every day | CUTANEOUS | Status: DC
  Administered 2020-12-11 – 2020-12-16 (×5): 6 via TOPICAL

## 2020-12-10 MED ORDER — FUROSEMIDE 10 MG/ML IJ SOLN: 20.0000 mg | Freq: Once | INTRAMUSCULAR | Status: AC

## 2020-12-10 MED ORDER — NOREPINEPHRINE 4 MG/250ML-% IV SOLN: 0.0000 ug/min | INTRAVENOUS | Status: DC

## 2020-12-10 MED ORDER — DEXTROSE 50 % IV SOLN
1.0000 | Freq: Once | INTRAVENOUS | Status: AC
  Administered 2020-12-10: 50 mL via INTRAVENOUS
  Filled 2020-12-10: qty 50

## 2020-12-10 MED ORDER — PERFLUTREN LIPID MICROSPHERE
1.0000 mL | INTRAVENOUS | Status: AC | PRN
  Administered 2020-12-10: 3 mL via INTRAVENOUS
  Filled 2020-12-10: qty 10

## 2020-12-10 MED ORDER — SODIUM CHLORIDE 0.9 % IV BOLUS
500.0000 mL | Freq: Once | INTRAVENOUS | Status: AC
  Administered 2020-12-10: 500 mL via INTRAVENOUS

## 2020-12-10 MED ORDER — LEVOFLOXACIN IN D5W 750 MG/150ML IV SOLN
750.0000 mg | INTRAVENOUS | Status: DC
  Administered 2020-12-10: 750 mg via INTRAVENOUS
  Filled 2020-12-10: qty 150

## 2020-12-10 MED ORDER — METHYLPREDNISOLONE SODIUM SUCC 125 MG IJ SOLR
80.0000 mg | INTRAMUSCULAR | Status: DC
  Administered 2020-12-10: 80 mg via INTRAVENOUS
  Filled 2020-12-10: qty 2

## 2020-12-10 MED ORDER — METHYLPREDNISOLONE SODIUM SUCC 40 MG IJ SOLR
40.0000 mg | Freq: Two times a day (BID) | INTRAMUSCULAR | Status: DC
  Administered 2020-12-11 – 2020-12-14 (×7): 40 mg via INTRAVENOUS
  Filled 2020-12-10 (×7): qty 1

## 2020-12-10 MED ORDER — NOREPINEPHRINE 4 MG/250ML-% IV SOLN
2.0000 ug/min | INTRAVENOUS | Status: DC
  Administered 2020-12-10: 4 ug/min via INTRAVENOUS
  Administered 2020-12-11: 9 ug/min via INTRAVENOUS
  Administered 2020-12-11: 12 ug/min via INTRAVENOUS
  Administered 2020-12-11: 4 ug/min via INTRAVENOUS
  Filled 2020-12-10 (×4): qty 250

## 2020-12-10 MED ORDER — INSULIN ASPART 100 UNIT/ML IV SOLN
10.0000 [IU] | Freq: Once | INTRAVENOUS | Status: AC
  Administered 2020-12-10: 10 [IU] via INTRAVENOUS
  Filled 2020-12-10: qty 0.1

## 2020-12-10 MED ORDER — LEVOFLOXACIN IN D5W 750 MG/150ML IV SOLN
750.0000 mg | INTRAVENOUS | Status: DC
  Filled 2020-12-10: qty 150

## 2020-12-10 MED ORDER — INSULIN ASPART 100 UNIT/ML IV SOLN
10.0000 [IU] | Freq: Once | INTRAVENOUS | Status: AC
  Administered 2020-12-11: 10 [IU] via INTRAVENOUS
  Filled 2020-12-10 (×2): qty 0.1

## 2020-12-10 MED ORDER — DEXTROSE 50 % IV SOLN
1.0000 | Freq: Once | INTRAVENOUS | Status: AC
  Administered 2020-12-11: 50 mL via INTRAVENOUS
  Filled 2020-12-10: qty 50

## 2020-12-10 MED ORDER — SODIUM CHLORIDE 0.9 % IV SOLN
250.0000 mL | INTRAVENOUS | Status: DC
  Administered 2020-12-10: 500 mL via INTRAVENOUS

## 2020-12-10 MED ORDER — FUROSEMIDE 8 MG/ML PO SOLN: 80.0000 mg | Freq: Once | ORAL | Status: DC

## 2020-12-10 MED ORDER — FUROSEMIDE 10 MG/ML IJ SOLN
80.0000 mg | Freq: Once | INTRAMUSCULAR | Status: AC
  Administered 2020-12-10: 80 mg via INTRAVENOUS
  Filled 2020-12-10: qty 8

## 2020-12-10 MED ORDER — FUROSEMIDE 10 MG/ML IJ SOLN
INTRAMUSCULAR | Status: AC
  Administered 2020-12-10: 20 mg via INTRAVENOUS
  Filled 2020-12-10: qty 2

## 2020-12-10 NOTE — Progress Notes (Addendum)
Patient very lethargic during assessment.  Noted O2 was 87% on 5 L (acute) with morning vital signs.  Recheck O2 - was at 92%.  Assessed Patient - audible wheezing noted and only answered to place and name, but would zone out in the middle of the conversation.  Only giving me 2-4 word answers.   Charge nurse made aware and paged MD  Dr. Posey Pronto came to bedside to assess the patient.  Gave verbal order to give patient 80 mg dose of lasix (once) this morning instead of 40 mg dose.

## 2020-12-10 NOTE — Consult Note (Signed)
[place] for warfarin Indication: atrial fibrillation  Allergies  Allergen Reactions   Penicillin V     Other reaction(s): Unknown Other reaction(s): Unknown As a child    Penicillins     Patient Measurements: Height: 6' (182.9 cm) Weight: 127 kg (279 lb 14.4 oz) IBW/kg (Calculated) : 77.6   Vital Signs: Temp: 98.3 F (36.8 C) (08/27 0730) Temp Source: Oral (08/27 0730) BP: 112/69 (08/27 0730) Pulse Rate: 99 (08/27 0730)  Labs: Recent Labs    12/09/20 1445 12/09/20 1711 12/09/20 2206 12/10/20 0427  HGB 10.0*  --   --   --   HCT 32.6*  --   --   --   PLT 122*  --   --   --   LABPROT 35.7*  --   --  35.6*  INR 3.6*  --   --  3.6*  CREATININE 1.45*  --   --  1.66*  TROPONINIHS  --  6 8  --      Estimated Creatinine Clearance: 58.7 mL/min (A) (by C-G formula based on SCr of 1.66 mg/dL (H)).   Medical History: Past Medical History:  Diagnosis Date   A-fib (HCC)    CHF (congestive heart failure) (HCC)    Coronary artery disease    Hypertension    Renal disorder     Medications:  Chronic Warfarin regimen: 4 mg daily (TWD = 28 mg)  Assessment: 70 y.o. male with medical history paroxysmal atrial fibrillation stable on chronic warfarin regimen as above. Presented to the ER with progressive shortness of breath lower extremity swelling, orthopnea and PND over the last week.  Scr trending up 1.45>1.66  INR supratherapeutic (3.6) on admission. No new outpatient medication identified (patient not taking prednisone -- verified with patient/wife) possibly 2/2 fluid overload/congestion?  Date INR Warfarin Dose  8/25 -- 4 mg (PTA) 8/26 3.6 Hold  8/27 3.6 Hold  Goal of Therapy:  INR 2-3 Monitor platelets by anticoagulation protocol: Yes   Plan:  INR 3.6 (still supratherapeutic) Withhold dose again tonight INR daily   Lu Duffel, PharmD, BCPS Clinical Pharmacist   12/10/2020,9:29 AM

## 2020-12-10 NOTE — Progress Notes (Signed)
Echo report  Unable to transfer report from [place]. Formal report to follow   Left ventricular ejection fraction, by estimation, is 50 to 55%. The left ventricle has low normal function. The left ventricle has no regional wall motion abnormalities. There is mild left ventricular hypertrophy. Left ventricular diastolic parameters are indeterminate.Right ventricular systolic function is low normal. The right ventricular size is mildly enlarged.Right atrial size was moderately dilated.The mitral valve is normal in structure. Trivial mitral valve regurgitation. No evidence of mitral stenosis.The aortic valve is normal in structure. Aortic valve regurgitation is not visualized. Mild aortic valve sclerosis is present, with no evidence of aortic valve stenosis.The inferior vena cava is dilated in size with <50% respiratory variability, suggesting right atrial pressure of 15 mmHg.  Andrez Grime, MD

## 2020-12-10 NOTE — Progress Notes (Addendum)
Reassessed patient -is still very groggy- unable get patient to verbally respond, but will squeeze my hand when asked.   Has a lot of congestion, tried to get patient to cough, but would not follow commands- sat patient upright to try to prevent him from choking.   Will continue to monitor.   Vitals signs obtained- O2 was at 100 on 5L.

## 2020-12-10 NOTE — Progress Notes (Signed)
[facility] [place] NAME: Timothy Chavez    MR#:  [id number]  DATE OF BIRTH:  11-27-1950  SUBJECTIVE:  patient came in with increasing shortness of breath and leg edema. He has known history of COPD. Sister at bedside. Patient patient has been sleepy although awake and stays a few words follows commands moves all extremities and falls back to sleep. Per report he has not slept much last night.   REVIEW OF SYSTEMS:   Review of Systems  Unable to perform ROS: Mental status change  Tolerating Diet: Tolerating PT:   DRUG ALLERGIES:   Allergies  Allergen Reactions   Penicillin V     Other reaction(s): Unknown Other reaction(s): Unknown As a child    Penicillins     VITALS:  Blood pressure 102/65, pulse 92, temperature (!) 100.4 F (38 C), temperature source Oral, resp. rate 18, height 6' (1.829 m), weight 127 kg, SpO2 96 %.  PHYSICAL EXAMINATION:   Physical Exam  GENERAL:  70 y.o.-year-old patient lying in the bed with no acute distress. obese HEENT: Head atraumatic, normocephalic. Oropharynx and nasopharynx clear.  Mouth breather LUNGS: decreased breath sounds bilaterally, no wheezing, rales, rhonchi. No use of accessory muscles of respiration.  CARDIOVASCULAR: S1, S2 normal. No murmurs, rubs, or gallops.  ABDOMEN: Soft, nontender, nondistended. Bowel sounds present. No organomegaly or mass. Abdominal obesity EXTREMITIES: No cyanosis, clubbing or edema b/l.    NEUROLOGIC: patient moves all extremities well. PSYCHIATRIC:  patient is sleepy somewhat lethargic but awakens on verbal commands SKIN: No obvious rash, lesion, or ulcer.   LABORATORY PANEL:  CBC Recent Labs  Lab 12/09/20 1445  WBC 4.7  HGB 10.0*  HCT 32.6*  PLT 122*    Chemistries  Recent Labs  Lab 12/10/20 0427  NA 139  K 4.6  CL 102  CO2 32  GLUCOSE 170*  BUN 18  CREATININE 1.66*  CALCIUM 8.4*   Cardiac Enzymes No results for input(s): TROPONINI in  the last 168 hours. RADIOLOGY:  DG Chest 2 View  Result Date: 12/09/2020 CLINICAL DATA:  Shortness of breath, lower extremity swelling. Chronic cough. EXAM: CHEST - 2 VIEW COMPARISON:  Chest radiograph 04/27/2008 FINDINGS: Small to moderate right pleural effusion with right lower lobe atelectasis. Diffuse hazy interstitial thickening, consistent with pulmonary edema. No pneumothorax. Heart size is difficult to evaluate due to adjacent consolidation but appears enlarged, stable. Mildly tortuous thoracic aorta with calcific atherosclerosis. No acute osseous abnormality. IMPRESSION: Pulmonary edema with small to moderate right pleural effusion. Stable cardiomegaly. Electronically Signed   By: Ileana Roup M.D.   On: 12/09/2020 15:27   CT HEAD WO CONTRAST (5MM)  Result Date: 12/10/2020 CLINICAL DATA:  Delirium.  Acute mental status change. EXAM: CT HEAD WITHOUT CONTRAST TECHNIQUE: Contiguous axial images were obtained from the base of the skull through the vertex without intravenous contrast. COMPARISON:  None. FINDINGS: The study is mildly motion degraded. Brain: There is no evidence of an acute cortically based infarct, intracranial hemorrhage, mass, midline shift, or extra-axial fluid collection. Hypodensities in the cerebral white matter bilaterally are nonspecific but compatible with mild-to-moderate chronic small vessel ischemic disease. Lacunar infarcts in the left greater than right basal ganglia are chronic in appearance. There are age indeterminate lacunar infarcts in the left corona radiata and left thalamus. The ventricles and sulci are within normal limits for age. Vascular: Calcified atherosclerosis at the skull base. No hyperdense vessel. Skull: No fracture or suspicious osseous lesion.  Sinuses/Orbits: Large mucous retention cyst in the right sphenoid sinus. Clear mastoid air cells. Unremarkable orbits. Other: None. IMPRESSION: 1. No evidence of an acute cortically based infarct or intracranial  hemorrhage. 2. Age indeterminate lacunar infarcts in the left corona radiata and left thalamus. 3. Chronic small vessel ischemia with chronic lacunar infarcts in the basal ganglia. Electronically Signed   By: Logan Bores M.D.   On: 12/10/2020 13:26   ASSESSMENT AND PLAN:   Timothy Chavez is a 70 y.o. male with medical history significant of systolic dysfunction CHF with EF of 45% from echocardiogram on May 10, 2020, paroxysmal atrial fibrillation, coronary artery disease, chronic kidney disease stage III, essential hypertension, GERD, and diabetes type 2 who presented to the ER with progressive shortness of breath lower extremity swelling, orthopnea and PND over the last week.   Acute on chronic systolic dysfunction CHF:  -- IV Lasix 40 mg BID, monitor input output daily weight  -- wean oxygen as able to  -- PRN inhalers/nebulizer  -- patient follows with [facility] cardiology will consult if needed  --Continue beta-blockers, continue ACEI although it will likely worsen renal function.  -- follow-up echocardiogram  altered mental status/lethargy -- CT head negative for acute stroke. Positive for chronic small vessel disease and lacunar infarct chronic -- no focal deficit noted   coronary artery disease: -- stable. Troponin normal    diabetes type II with chronic kidney disease stage IIIa -- initiate sliding scale insulin.  Chronic kidney disease stage IIIa -- baseline creatinine 1.5-- 1.8 -- came in with creatinine of 1.4----1.6   Essential hypertension:  --Blood pressure appears reasonably controlled at the moment.  -- Continue carvedilol and enalapril.   Hyperlipidemia --Continue pravastatin.   atrial fibrillation: Rate is controlled.   --Continue carvedilol and warfarin   history of COPD with Ex- tobacco Suspected sleep apnea -- PRN inhalers/nebulizer -- patient will benefit from outpatient sleep study  paroxysmal atrial fibrillation -- on Coreg and PO warfarin. INR  therapeutic     DVT prophylaxis: Warfarin Code Status: DNR per orders Family Communication: sister at bedside Disposition Plan: Home Consults called: None         TOTAL TIME TAKING CARE OF THIS PATIENT: 30 minutes.  >50% time spent on counselling and coordination of care  Note: This dictation was prepared with Dragon dictation along with smaller phrase technology. Any transcriptional errors that result from this process are unintentional.  Fritzi Mandes M.D    [facility]   CC: Primary care physician; Idelle Crouch, MD Patient ID: [name], male   DOB: 1950-10-16, 70 y.o.   MRN: [id number]

## 2020-12-10 NOTE — Progress Notes (Addendum)
Patient ID: Timothy Chavez, male   DOB: Mar 30, 1951, 70 y.o.   MRN: [id number] Pt remians lethargic, responds to voice. Running fever 100.6, relative hypotension, tachycardia, elevated procalcitonin. D/w ICU charge--will transfer to stepdown for close monitoring  CT head earlier neg Send UA, UC, cbc, bmp, LA Repeat cxr Consider levophed to keep MAP greater than 65  Start IV levaquin empirically for now.   Spoke with sister carolyn on the phone. She will d/w her other siblings about code status and let the RN know if there is any change. She understands at present pt is DNR (admission)  D/w Dr Mortimer Fries

## 2020-12-10 NOTE — Progress Notes (Signed)
Placed on 100%. Sats 84%. Still sounds coarse throughout. Only 284m out post '20mg'$  Lasix, tea colored. Tidal volumes low, dipping in the 100s. DHinton Dyer NP made aware and bipap adjustments made. Will continue to monitor.

## 2020-12-10 NOTE — Progress Notes (Signed)
Patient continues to show no signs of awareness. Vital signs show low grade temp, BP gradually dropping, Resp. Rate still elevated in the 30s-40s,   Paged Rapid response-  Patient being transferred to ICU

## 2020-12-10 NOTE — Consult Note (Addendum)
NAME:  Timothy Chavez, MRN:  [id number], DOB:  11/29/1950, LOS: 1 ADMISSION DATE:  12/09/2020, CONSULTATION DATE: 12/10/2020 REFERRING MD: Dr. Posey Pronto CHIEF COMPLAINT: Hypotension   History of Present Illness:  This is a 70 yo male who presented to [facility] ER on 08/26 with worsening shortness of breath and bilateral lower extremity swelling over the past month. Per ER notes the pt reported he has been taking his cardiac medications (40 mg lasix daily) as prescribed and 40 mg prednisone daily, however symptoms have persisted.    ED course Upon arrival to the ER pt alert and oriented with O2 sats in the mid 80's on RA.  Pt initially placed on 2L O2 via nasal canula, however O2 increased to 5L due to continued hypoxia.  Lab results revealed glucose 220, creatinine 1.45, BNP 590.9, hgb 10.0, platelets 122, PT 35.7, and INR 3.6.  COVID-19/Influenza PCR negative, however CXR concerning for pulmonary edema and small to moderate right pleural effusion.  He was subsequently admitted to the [facility] unit for additional workup and and treatment per hospitalist team.  Hospital course complicated on [id number] due to pt becoming lethargic and hypotensive with development of worsening acute hypoxic respiratory failure.  He required transfer to the stepdown unit for continuous Bipap and possible levophed gtt.  CXR concerning for right sided pleural effusion with opacity and mild pulmonary edema.  ABG revealed pH 7.15/pCO2 101/pO2 59/acid-base excess 4.2/bicarb 35.2.  PCCM consulted to assist management.    Pertinent  Medical History  Former Smoker Stage 3b CKD  Paroxysmal Atrial Fibrillation  CAD HTN Chronic Systolic CHF  Cardiomyopathy   Significant Hospital Events: Including procedures, antibiotic start and stop dates in addition to other pertinent events   Pt admitted to the [facility] unit with acute on chronic systolic CHF exacerbation  Pt transferred to the stepdown unit with worsening acute hypercapnic hypoxic  respiratory failure and hypotension requiring Bipap.  PCCM consulted to assist with management  Test Results:  Echo 08/27>>EF 50 to 55%   Micro Data:   Resp Panel by RT-PCR 08/26>>negative  MRSA PCR 08/27>> Urine 08/27>>  Antimicrobials:  Levaquin 08/27>>  Interim History / Subjective:  Pt currently on Bipap with increased with of breathing, O2 sats 87-89% with FiO2 '@50'$ %, and somnolent but able to follow simplecommands.  Currently not requiring vasopressors sbp 90-low 100's.   Objective   Blood pressure (!) 95/58, pulse (!) 107, temperature 100 F (37.8 C), temperature source Axillary, resp. rate (!) 30, height 6' (1.829 m), weight 127 kg, SpO2 93 %.        Intake/Output Summary (Last 24 hours) at 12/10/2020 1846 Last data filed at 12/10/2020 1350 Gross per 24 hour  Intake 0 ml  Output 350 ml  Net -350 ml   Filed Weights   12/09/20 2141 12/10/20 0316  Weight: 127 kg 127 kg   Examination: General: acutely ill appearing male, in respiratory distress on Bipap  HENT: supple, mild JVD present  Lungs: rhonchi throughout, labored, tachypneic with use of accessory muscles  Cardiovascular: irregular irregular, no R/G, 2+ radial/1+ distal pulses, 2+ bilateral lower extremity edema  Abdomen: +BS x4, obese, soft, non distended  Extremities: LLE pain present, moves all extremities  Neuro: somnolent, follows commands, PERRL  GU: indwelling foley draining dark yellow urine   Resolved Hospital Problem list   N/A  Assessment & Plan:   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 failure secondary to acute systolic CHF exacerbation, right sided pleural effusion, and possible pneumonia  Hx: Former Smoker and COPD  Prn Bipap or supplemental O2 for dyspnea and/or hypoxia Nebulized and iv steroids  Prn bronchodilator therapy  Trend WBC and monitor fever curve Follow cultures  Continue iv levaquin  Will place orders for IR Right Sided Thoracentesis  Repeat ABG pending   Acute on  chronic systolic CHF exacerbation~EF 50 to 55% via Echo 12/10/20  Hypotension concerning for sepsis with septic shock  Paroxysmal atrial fibrillation on chronic coumadin-INR supratherapeutic on admission~3.6 Hx: Cardiomyopathy, HTN, and CAD  Continuous telemetry monitoring  Hold outpatient antihypertensives for now  Continue outpatient pravachol  Prn levophed gtt to maintain map >65 Continue iv lasix 40 mg bid  Coumadin dosing per pharmacy   Consider Cardiology consult to assist with management   Acute on chronic renal failure (stage III) Trend BMP  Replace electrolytes as indicated  Monitor UOP Avoid nephrotoxic medications when able  If renal function worsens will consult nephrology   Type II diabetes mellitus: Hemoglobin A1C 08/27~6.8 SSI   Acute encephalopathy secondary to hypercapnia~CT Head 08/27 negative for acute abnormality  Avoid sedating medication for now  Frequent reorientation   Best Practice (right click and "Reselect all SmartList Selections" daily)   Diet/type: NPO DVT prophylaxis: Coumadin GI prophylaxis: N/A Lines: N/A Foley:  Yes, and it is still needed Code Status:  DNR Last date of multidisciplinary goals of care discussion [N/A]  Clarified code status with pts sister Johnanna Schneiders at bedside and she stated pt to remain DO NOT RESUSCITATE/DO NOT INTUBATE  Labs   CBC: Recent Labs  Lab 12/09/20 1445  WBC 4.7  HGB 10.0*  HCT 32.6*  MCV 90.3  PLT 122*    Basic Metabolic Panel: Recent Labs  Lab 12/09/20 1445 12/10/20 0427  NA 138 139  K 3.7 4.6  CL 103 102  CO2 30 32  GLUCOSE 220* 170*  BUN 14 18  CREATININE 1.45* 1.66*  CALCIUM 8.7* 8.4*   GFR: Estimated Creatinine Clearance: 58.7 mL/min (A) (by C-G formula based on SCr of 1.66 mg/dL (H)). Recent Labs  Lab 12/09/20 1445 12/10/20 0427  PROCALCITON  --  5.34  WBC 4.7  --     Liver Function Tests: No results for input(s): AST, ALT, ALKPHOS, BILITOT, PROT, ALBUMIN in the last  168 hours. No results for input(s): LIPASE, AMYLASE in the last 168 hours. No results for input(s): AMMONIA in the last 168 hours.  ABG    Component Value Date/Time   PHART 7.15 (LL) 12/10/2020 1827   PCO2ART 101 (HH) 12/10/2020 1827   PO2ART 59 (L) 12/10/2020 1827   HCO3 35.2 (H) 12/10/2020 1827   O2SAT 81.3 12/10/2020 1827     Coagulation Profile: Recent Labs  Lab 12/09/20 1445 12/10/20 0427  INR 3.6* 3.6*    Cardiac Enzymes: No results for input(s): CKTOTAL, CKMB, CKMBINDEX, TROPONINI in the last 168 hours.  HbA1C: Hgb A1c MFr Bld  Date/Time Value Ref Range Status  12/10/2020 04:27 AM 6.8 (H) 4.8 - 5.6 % Final    Comment:    (NOTE) Pre diabetes:          5.7%-6.4%  Diabetes:              >6.4%  Glycemic control for   <7.0% adults with diabetes     CBG: Recent Labs  Lab 12/10/20 0731 12/10/20 1150 12/10/20 1654 12/10/20 1828  GLUCAP 158* 136* 102* 101*    Review of Systems:   Unable to assess pt remains somnolent  Past Medical History:  He,  has a past medical history of A-fib ([place]), CHF (congestive heart failure) ([place]), Coronary artery disease, Hypertension, and Renal disorder.   Surgical History:  History reviewed. No pertinent surgical history.   Social History:   reports that he has never smoked. He has never been exposed to tobacco smoke. He has never used smokeless tobacco. He reports that he does not currently use alcohol. He reports that he does not currently use drugs.   Family History:  His family history is not on file.   Allergies Allergies  Allergen Reactions   Penicillin V     Other reaction(s): Unknown Other reaction(s): Unknown As a child    Penicillins      Home Medications  Prior to Admission medications   Medication Sig Start Date End Date Taking? Authorizing Provider  acetaminophen (TYLENOL) 650 MG CR tablet Take 650 mg by mouth as needed for pain.   Yes [provider]  amLODipine (NORVASC) 10 MG tablet  Take 10 mg by mouth daily. 10/26/20  Yes [provider]  calcitRIOL (ROCALTROL) 0.25 MCG capsule Take 1 capsule by mouth 3 (three) times a week. 12/06/20  Yes [provider]  carvedilol (COREG) 25 MG tablet Take 25 mg by mouth 2 (two) times daily. 10/26/20  Yes [provider]  enalapril (VASOTEC) 20 MG tablet Take 20 mg by mouth daily. 10/26/20  Yes [provider]  furosemide (LASIX) 40 MG tablet Take 40 mg by mouth daily. 10/26/20  Yes [provider]  hydrALAZINE (APRESOLINE) 50 MG tablet Take 50 mg by mouth 4 (four) times daily. 10/26/20  Yes [provider]  potassium chloride SA (KLOR-CON) 20 MEQ tablet Take 20 mEq by mouth daily. 10/26/20  Yes [provider]  pravastatin (PRAVACHOL) 20 MG tablet Take 20 mg by mouth daily as needed. 10/26/20  Yes [provider]  warfarin (COUMADIN) 4 MG tablet Take 4 mg by mouth daily. 10/26/20  Yes [provider]     Rosilyn Mings, [place] Pager [phone] (please enter 7 digits) [place] Pager [phone] (please enter 7 digits)

## 2020-12-10 NOTE — Progress Notes (Signed)
   12/10/20 1545  Assess: MEWS Score  Temp 100.2 F (37.9 C)  BP 100/63  Pulse Rate 94  Resp (!) 30  Level of Consciousness Responds to Voice  SpO2 100 %  O2 Device Nasal Cannula  O2 Flow Rate (L/min) 5 L/min  Assess: MEWS Score  MEWS Temp 0  MEWS Systolic 1  MEWS Pulse 0  MEWS RR 2  MEWS LOC 1  MEWS Score 4  MEWS Score Color Red  Assess: if the MEWS score is Yellow or Red  Were vital signs taken at a resting state? Yes  Focused Assessment Change from prior assessment (see assessment flowsheet)  Does the patient meet 2 or more of the SIRS criteria? Yes  Does the patient have a confirmed or suspected source of infection? No  MEWS guidelines implemented *See Row Information* Yes  Treat  Pain Scale Faces  Pain Score 0  Take Vital Signs  Increase Vital Sign Frequency  Red: Q 1hr X 4 then Q 4hr X 4, if remains red, continue Q 4hrs  Escalate  MEWS: Escalate Red: discuss with charge nurse/RN and provider, consider discussing with RRT  Notify: Charge Nurse/RN  Name of Charge Nurse/RN Notified Colletta [place] RN  Date Charge Nurse/RN Notified 12/10/20  Time Charge Nurse/RN Notified 1550  Notify: Provider  Provider Name/Title Dr. Posey Pronto  Date Provider Notified 12/10/20  Time Provider Notified 1550  Notification Type Page (secure chat)  Notification Reason Change in status  Provider response No new orders  Notify: Rapid Response  Name of Rapid Response RN Notified Katie RN  Date Rapid Response Notified 12/10/20  Time Rapid Response Notified [id number]  Document  Patient Outcome Not stable and remains on department  Progress note created (see row info) Yes  Assess: SIRS CRITERIA  SIRS Temperature  0  SIRS Pulse 1  SIRS Respirations  1  SIRS WBC 0  SIRS Score Sum  2   Patient has been very groggy all day.  Patient will not respond verbally, however he does follow commands when asked to grip hands, move feet and will nod to answer questions.  No urine output since this morning.  Last  bladder scan was around 4 pm showed 251 ml.    Dr. Posey Pronto aware.   Set vitals for red mews protocol

## 2020-12-10 NOTE — Progress Notes (Signed)
Received to ICU s/p RRT, poorly responsive. ABG obtained per RT and placed on bipap for respiratory distress. Dr Posey Pronto notified, new orders, NS bolus started. Called for stat CXR.

## 2020-12-10 NOTE — Plan of Care (Signed)

## 2020-12-10 NOTE — Progress Notes (Signed)
*  PRELIMINARY RESULTS* Echocardiogram 2D Echocardiogram has been performed. Definity IV Contrast used on this study.  [name] 12/10/2020, 11:12 AM

## 2020-12-10 NOTE — Plan of Care (Signed)
Problem: Education: Goal: Knowledge of General Education information will improve Description: Including pain rating scale, medication(s)/side effects and non-pharmacologic comfort measures 12/10/2020 2322 by Patrecia Pace, RN Outcome: Progressing 12/10/2020 2320 by Patrecia Pace, RN Outcome: Progressing   Problem: Health Behavior/Discharge Planning: Goal: Ability to manage health-related needs will improve 12/10/2020 2322 by Patrecia Pace, RN Outcome: Progressing 12/10/2020 2320 by Patrecia Pace, RN Outcome: Progressing   Problem: Clinical Measurements: Goal: Ability to maintain clinical measurements within normal limits will improve 12/10/2020 2322 by Patrecia Pace, RN Outcome: Progressing 12/10/2020 2320 by Patrecia Pace, RN Outcome: Progressing Goal: Will remain free from infection 12/10/2020 2322 by Patrecia Pace, RN Outcome: Progressing 12/10/2020 2320 by Patrecia Pace, RN Outcome: Progressing Goal: Diagnostic test results will improve 12/10/2020 2322 by Patrecia Pace, RN Outcome: Progressing 12/10/2020 2320 by Patrecia Pace, RN Outcome: Progressing Goal: Respiratory complications will improve 12/10/2020 2322 by Patrecia Pace, RN Outcome: Progressing 12/10/2020 2320 by Patrecia Pace, RN Outcome: Progressing Goal: Cardiovascular complication will be avoided 12/10/2020 2322 by Patrecia Pace, RN Outcome: Progressing 12/10/2020 2320 by Patrecia Pace, RN Outcome: Progressing   Problem: Activity: Goal: Risk for activity intolerance will decrease 12/10/2020 2322 by Patrecia Pace, RN Outcome: Progressing 12/10/2020 2320 by Patrecia Pace, RN Outcome: Progressing   Problem: Nutrition: Goal: Adequate nutrition will be maintained 12/10/2020 2322 by Patrecia Pace, RN Outcome: Progressing 12/10/2020 2320 by Patrecia Pace, RN Outcome: Progressing   Problem: Coping: Goal: Level of anxiety will decrease 12/10/2020  2322 by Patrecia Pace, RN Outcome: Progressing 12/10/2020 2320 by Patrecia Pace, RN Outcome: Progressing   Problem: Elimination: Goal: Will not experience complications related to bowel motility 12/10/2020 2322 by Patrecia Pace, RN Outcome: Progressing 12/10/2020 2320 by Patrecia Pace, RN Outcome: Progressing Goal: Will not experience complications related to urinary retention 12/10/2020 2322 by Patrecia Pace, RN Outcome: Progressing 12/10/2020 2320 by Patrecia Pace, RN Outcome: Progressing   Problem: Pain Managment: Goal: General experience of comfort will improve 12/10/2020 2322 by Patrecia Pace, RN Outcome: Progressing 12/10/2020 2320 by Patrecia Pace, RN Outcome: Progressing   Problem: Safety: Goal: Ability to remain free from injury will improve 12/10/2020 2322 by Patrecia Pace, RN Outcome: Progressing 12/10/2020 2320 by Patrecia Pace, RN Outcome: Progressing   Problem: Skin Integrity: Goal: Risk for impaired skin integrity will decrease 12/10/2020 2322 by Patrecia Pace, RN Outcome: Progressing 12/10/2020 2320 by Patrecia Pace, RN Outcome: Progressing   Problem: Education: Goal: Knowledge of General Education information will improve Description: Including pain rating scale, medication(s)/side effects and non-pharmacologic comfort measures 12/10/2020 2322 by Patrecia Pace, RN Outcome: Progressing 12/10/2020 2320 by Patrecia Pace, RN Outcome: Progressing   Problem: Health Behavior/Discharge Planning: Goal: Ability to manage health-related needs will improve 12/10/2020 2322 by Patrecia Pace, RN Outcome: Progressing 12/10/2020 2320 by Patrecia Pace, RN Outcome: Progressing   Problem: Clinical Measurements: Goal: Ability to maintain clinical measurements within normal limits will improve 12/10/2020 2322 by Patrecia Pace, RN Outcome: Progressing 12/10/2020 2320 by Patrecia Pace, RN Outcome:  Progressing Goal: Will remain free from infection 12/10/2020 2322 by Patrecia Pace, RN Outcome: Progressing 12/10/2020 2320 by Patrecia Pace, RN Outcome: Progressing Goal: Diagnostic test results will improve 12/10/2020 2322 by Patrecia Pace, RN Outcome: Progressing 12/10/2020 2320 by Patrecia Pace, RN Outcome: Progressing Goal: Respiratory complications will improve 12/10/2020 2322 by Patrecia Pace, RN Outcome: Progressing 12/10/2020 2320 by Patrecia Pace, RN Outcome: Progressing Goal: Cardiovascular complication will be avoided 12/10/2020 2322 by Patrecia Pace, RN Outcome: Progressing 12/10/2020 2320 by Patrecia Pace, RN Outcome: Progressing   Problem: Activity: Goal:  Risk for activity intolerance will decrease 12/10/2020 2322 by Patrecia Pace, RN Outcome: Progressing 12/10/2020 2320 by Patrecia Pace, RN Outcome: Progressing   Problem: Nutrition: Goal: Adequate nutrition will be maintained 12/10/2020 2322 by Patrecia Pace, RN Outcome: Progressing 12/10/2020 2320 by Patrecia Pace, RN Outcome: Progressing   Problem: Coping: Goal: Level of anxiety will decrease 12/10/2020 2322 by Patrecia Pace, RN Outcome: Progressing 12/10/2020 2320 by Patrecia Pace, RN Outcome: Progressing   Problem: Elimination: Goal: Will not experience complications related to bowel motility 12/10/2020 2322 by Patrecia Pace, RN Outcome: Progressing 12/10/2020 2320 by Patrecia Pace, RN Outcome: Progressing Goal: Will not experience complications related to urinary retention 12/10/2020 2322 by Patrecia Pace, RN Outcome: Progressing 12/10/2020 2320 by Patrecia Pace, RN Outcome: Progressing   Problem: Pain Managment: Goal: General experience of comfort will improve 12/10/2020 2322 by Patrecia Pace, RN Outcome: Progressing 12/10/2020 2320 by Patrecia Pace, RN Outcome: Progressing   Problem: Safety: Goal: Ability to remain free  from injury will improve 12/10/2020 2322 by Patrecia Pace, RN Outcome: Progressing 12/10/2020 2320 by Patrecia Pace, RN Outcome: Progressing   Problem: Skin Integrity: Goal: Risk for impaired skin integrity will decrease 12/10/2020 2322 by Patrecia Pace, RN Outcome: Progressing 12/10/2020 2320 by Patrecia Pace, RN Outcome: Progressing

## 2020-12-10 NOTE — Progress Notes (Signed)
Patient persistently hypotensive. Peripheral Levophed started and titrated to 57mg. BP 84/54, MAP 64. DHinton Dyer NP made aware. Will continue to monitor.

## 2020-12-10 NOTE — Progress Notes (Signed)
  Chaplain On-Call returned to patient's room after earlier attempt this morning to visit.  Chaplain met patient's sister Johnanna Schneiders at bedside. Hoyle Sauer stated that the patient is not able to read the Advance Directives documents today, due to his condition.  Chaplain gave the AD documents to [place] and provided brief education. Chaplain assured Hoyle Sauer that the [employer] can be paged when the patient's condition improves.  Chaplain Pollyann Samples M.Div., [facility]

## 2020-12-10 NOTE — Progress Notes (Signed)
  Chaplain On-Call responded to Order Requisition from 2153 on August 26.  Chaplain met with Anderson Malta, the patient's Nurse, who described the patient as lethargic and capable only of one or two words at a time due to shortness of breath.  Anderson Malta recommended that the Chaplain return later today and attempt to provide Advance Directives education for the patient.  Chaplain Pollyann Samples M.Div., [facility]

## 2020-12-11 ENCOUNTER — Inpatient Hospital Stay: Payer: PPO

## 2020-12-11 DIAGNOSIS — J9621 Acute and chronic respiratory failure with hypoxia: Secondary | ICD-10-CM | POA: Diagnosis not present

## 2020-12-11 DIAGNOSIS — I509 Heart failure, unspecified: Secondary | ICD-10-CM | POA: Diagnosis not present

## 2020-12-11 DIAGNOSIS — I5043 Acute on chronic combined systolic (congestive) and diastolic (congestive) heart failure: Secondary | ICD-10-CM | POA: Diagnosis not present

## 2020-12-11 LAB — BASIC METABOLIC PANEL
Anion gap: 6 (ref 5–15)
BUN: 42 mg/dL — ABNORMAL HIGH (ref 8–23)
CO2: 31 mmol/L (ref 22–32)
Calcium: 8.5 mg/dL — ABNORMAL LOW (ref 8.9–10.3)
Chloride: 100 mmol/L (ref 98–111)
Creatinine, Ser: 2.74 mg/dL — ABNORMAL HIGH (ref 0.61–1.24)
GFR, Estimated: 24 mL/min — ABNORMAL LOW (ref >60)
Glucose, Bld: 214 mg/dL — ABNORMAL HIGH (ref 70–99)
Potassium: 5.3 mmol/L — ABNORMAL HIGH (ref 3.5–5.1)
Sodium: 137 mmol/L (ref 135–145)

## 2020-12-11 LAB — GLUCOSE, CAPILLARY
Glucose-Capillary: 140 mg/dL — ABNORMAL HIGH (ref 70–99)
Glucose-Capillary: 203 mg/dL — ABNORMAL HIGH (ref 70–99)
Glucose-Capillary: 185 mg/dL — ABNORMAL HIGH (ref 70–99)
Glucose-Capillary: 185 mg/dL — ABNORMAL HIGH (ref 70–99)
Glucose-Capillary: 174 mg/dL — ABNORMAL HIGH (ref 70–99)

## 2020-12-11 LAB — CORTISOL-AM, BLOOD: Cortisol - AM: 50.5 ug/dL — ABNORMAL HIGH (ref 6.7–22.6)

## 2020-12-11 LAB — CBC
HCT: 33.9 % — ABNORMAL LOW (ref 39.0–52.0)
Hemoglobin: 10.1 g/dL — ABNORMAL LOW (ref 13.0–17.0)
MCH: 27.8 pg (ref 26.0–34.0)
MCHC: 29.8 g/dL — ABNORMAL LOW (ref 30.0–36.0)
MCV: 93.4 fL (ref 80.0–100.0)
Platelets: 108 10*3/uL — ABNORMAL LOW (ref 150–400)
RBC: 3.63 MIL/uL — ABNORMAL LOW (ref 4.22–5.81)
RDW: 15.6 % — ABNORMAL HIGH (ref 11.5–15.5)
WBC: 10.8 10*3/uL — ABNORMAL HIGH (ref 4.0–10.5)
nRBC: 0 % (ref 0.0–0.2)

## 2020-12-11 LAB — PROTIME-INR
INR: 4.8 (ref 0.8–1.2)
Prothrombin Time: 45 seconds — ABNORMAL HIGH (ref 11.4–15.2)

## 2020-12-11 LAB — MAGNESIUM: Magnesium: 2 mg/dL (ref 1.7–2.4)

## 2020-12-11 LAB — PHOSPHORUS: Phosphorus: 5.2 mg/dL — ABNORMAL HIGH (ref 2.5–4.6)

## 2020-12-11 LAB — POTASSIUM: Potassium: 5.3 mmol/L — ABNORMAL HIGH (ref 3.5–5.1)

## 2020-12-11 MED ORDER — LACTATED RINGERS IV BOLUS
500.0000 mL | Freq: Once | INTRAVENOUS | Status: AC
  Administered 2020-12-11: 500 mL via INTRAVENOUS

## 2020-12-11 MED ORDER — PATIROMER SORBITEX CALCIUM 8.4 G PO PACK
8.4000 g | PACK | Freq: Once | ORAL | Status: AC
  Administered 2020-12-11: 8.4 g via ORAL
  Filled 2020-12-11: qty 1

## 2020-12-11 MED ORDER — FUROSEMIDE 10 MG/ML IJ SOLN
40.0000 mg | Freq: Once | INTRAMUSCULAR | Status: AC
  Administered 2020-12-11: 40 mg via INTRAVENOUS
  Filled 2020-12-11: qty 4

## 2020-12-11 MED ORDER — FUROSEMIDE 10 MG/ML IJ SOLN
5.0000 mg/h | INTRAVENOUS | Status: DC
  Administered 2020-12-11 – 2020-12-14 (×3): 5 mg/h via INTRAVENOUS
  Filled 2020-12-11 (×5): qty 20

## 2020-12-11 NOTE — Progress Notes (Signed)
Patient now requiring 37mg Levophed for BP support. DRosilyn Mings NP aware. MAP within goal. Will continue to monitor.

## 2020-12-11 NOTE — Progress Notes (Signed)
Patient has had minimal response to Lasix gtt. Only 100 ml out on first assessment. Clarified that the Lasix gtt is to continue infusing.Will continue to monitor.

## 2020-12-11 NOTE — TOC Initial Note (Signed)
Transition of Care [facility]) - Initial/Assessment Note    Patient Details  Name: Timothy Chavez MRN: [id number] Date of Birth: 11-Jul-1950  Transition of Care [facility]) CM/SW Contact:    Ova Freshwater Phone Number: [phone] 12/11/2020, 3:32 PM  Clinical Narrative:                  Patient presents to [facility] due to SOB which has worsen recently and bilateral leg swelling.  Patient lives at home alone and is independent w/ ADLs.  Patient's main contact is Humphrey,Carolyn (Sister)  [phone] [facility]). Patient currently on BiPap, remains in ICU.  Expected Discharge Plan: [place] Barriers to Discharge: Continued Medical Work up   Patient Goals and CMS Choice        Expected Discharge Plan and Services Expected Discharge Plan: [place] In-house Referral: Clinical Social Work, Chaplain     Living arrangements for the past 2 months: [place]                                      Prior Living Arrangements/Services Living arrangements for the past 2 months: Single Family Home Lives with:: Self Patient language and need for interpreter reviewed:: No Do you feel safe going back to the place where you live?: Yes      Need for Family Participation in Patient Care: Yes (Comment)     Criminal Activity/Legal Involvement Pertinent to Current Situation/Hospitalization: No - Comment as needed  Activities of Daily Living Home Assistive Devices/Equipment: None ADL Screening (condition at time of admission) Patient's cognitive ability adequate to safely complete daily activities?: Yes Is the patient deaf or have difficulty hearing?: No Does the patient have difficulty seeing, even when wearing glasses/contacts?: No Does the patient have difficulty concentrating, remembering, or making decisions?: No Patient able to express need for assistance with ADLs?: Yes Does the patient have difficulty dressing or bathing?:  No Independently performs ADLs?: Yes (appropriate for developmental age) Does the patient have difficulty walking or climbing stairs?: Yes Weakness of Legs: None Weakness of Arms/Hands: None  Permission Sought/Granted         Permission granted to share info w AGENCY: Humphrey,Carolyn (Sister)   [phone] (Mobile        Emotional Assessment Appearance:: Appears stated age Attitude/Demeanor/Rapport: Unable to Assess Affect (typically observed): Unable to Assess Orientation: : Fluctuating Orientation (Suspected and/or reported Sundowners) Alcohol / Substance Use: Not Applicable Psych Involvement: No (comment)  Admission diagnosis:  Acute on chronic combined systolic and diastolic CHF, NYHA class 3 (HCC) [I50.43] Acute on chronic respiratory failure with hypoxia (HCC) [J96.21] Acute on chronic congestive heart failure, unspecified heart failure type [facility]) [I50.9] Patient Active Problem List   Diagnosis Date Noted   Acute on chronic congestive heart failure (HCC)    Acute on chronic respiratory failure with hypoxia (HCC)    GERD (gastroesophageal reflux disease) 12/09/2020   Acute on chronic combined systolic and diastolic CHF, NYHA class 3 ([place]) 12/09/2020   Type II diabetes mellitus with complication ([place]) XX123456   Chronic kidney disease, stage III (moderate) (HCC) XX123456   Chronic systolic heart failure ([place]) 01/08/2019   Hypertension 01/08/2019   Secondary hyperparathyroidism of renal origin ([place]) 01/08/2019   Current use of long term anticoagulation 12/15/2015   PAF (paroxysmal atrial fibrillation) ([place]) 02/22/2014   Hyperlipidemia 09/22/2013   Benign essential hypertension 07/21/2013  CAD (coronary artery disease), native coronary artery 07/21/2013   PCP:  Idelle Crouch, MD Pharmacy:   [place], [place] - [address] [place] [address] Phone: [phone] Fax: [phone]     Social Determinants of Health  (SDOH) Interventions    Readmission Risk Interventions No flowsheet data found.

## 2020-12-11 NOTE — Progress Notes (Signed)
Patient was  pleasantly awake on bipap. Mask removed as patient began coughing up thick white yellow secretions. Patient talking and grinning. Placed on 6 liter nasal cannula to allow for a break from bipap. NP good with sats 88%. Bipap on standby for now. NP aware.

## 2020-12-11 NOTE — Plan of Care (Signed)

## 2020-12-11 NOTE — Consult Note (Signed)
[facility] CONSULT NOTE    Date: 12/11/2020                  Patient Name:  Timothy Chavez  MRN: [id number]  DOB: 10-13-1950  Age / Sex: 70 y.o., male         PCP: Idelle Crouch, MD                 Service Requesting Consult: ICU                  Reason for Consult: Acute kidney injury            History of Present Illness: Patient is a 70 y.o. male with a PMHx of hypertension, coronary artery disease, congestive heart failure, chronic kidney disease stage IIIa, anemia now with history of worsening leg edema, shortness of breath and orthopnea.  Patient also had paroxysmal nocturnal dyspnea.  Initially he was admitted for treatment of congestive heart failure.  Patient subsequently became lethargic with worsening shortness of breath and leg edema and is transferred to the intensive care unit.  Patient is presently on BiPAP.  He was given IV fluid resuscitation initially.   Medications: Outpatient medications: Medications Prior to Admission  Medication Sig Dispense Refill Last Dose   acetaminophen (TYLENOL) 650 MG CR tablet Take 650 mg by mouth as needed for pain.   prn at prn   amLODipine (NORVASC) 10 MG tablet Take 10 mg by mouth daily.   12/09/2020 at 0500   calcitRIOL (ROCALTROL) 0.25 MCG capsule Take 1 capsule by mouth 3 (three) times a week.   12/07/2020 at unk   carvedilol (COREG) 25 MG tablet Take 25 mg by mouth 2 (two) times daily.   12/09/2020 at 0500   enalapril (VASOTEC) 20 MG tablet Take 20 mg by mouth daily.   12/08/2020 at pm   furosemide (LASIX) 40 MG tablet Take 40 mg by mouth daily.   12/09/2020 at 0500   hydrALAZINE (APRESOLINE) 50 MG tablet Take 50 mg by mouth 4 (four) times daily.   12/09/2020 at 0500   potassium chloride SA (KLOR-CON) 20 MEQ tablet Take 20 mEq by mouth daily.   12/08/2020 at pm   pravastatin (PRAVACHOL) 20 MG tablet Take 20 mg by mouth daily as needed.   12/08/2020 at pm   warfarin (COUMADIN) 4 MG tablet Take 4 mg by mouth  daily.   12/08/2020 at pm    Current medications: Current Facility-Administered Medications  Medication Dose Route Frequency Provider Last Rate Last Admin   0.9 %  sodium chloride infusion  250 mL Intravenous PRN Gala Romney L, MD       0.9 %  sodium chloride infusion  250 mL Intravenous Continuous Dorothe Pea, RPH   Stopped at 12/11/20 [id number]   acetaminophen (TYLENOL) tablet 650 mg  650 mg Oral Q4H PRN Elwyn Reach, MD       budesonide (PULMICORT) nebulizer solution 0.5 mg  0.5 mg Nebulization BID Milus Banister, NP   0.5 mg at 12/11/20 [id number]   calcitRIOL (ROCALTROL) capsule 0.25 mcg  0.25 mcg Oral Once per day on Mon Wed Fri Garba, Mohammad L, MD       Chlorhexidine Gluconate Cloth 2 % PADS 6 each  6 each Topical Daily Fritzi Mandes, MD       furosemide (LASIX) 200 mg in dextrose 5 % 100 mL (2 mg/mL) infusion  5 mg/hr Intravenous Continuous Lang Snow, NP  furosemide (LASIX) injection 40 mg  40 mg Intravenous Once Lang Snow, NP       insulin aspart (novoLOG) injection 0-15 Units  0-15 Units Subcutaneous TID WC Elwyn Reach, MD   5 Units at 12/11/20 0803   insulin aspart (novoLOG) injection 0-5 Units  0-5 Units Subcutaneous QHS Elwyn Reach, MD       levalbuterol (XOPENEX) nebulizer solution 1.25 mg  1.25 mg Nebulization Q6H PRN Fritzi Mandes, MD       [START ON 12/12/2020] levofloxacin (LEVAQUIN) IVPB 750 mg  750 mg Intravenous Q48H Dolan, Carissa E, RPH       methylPREDNISolone sodium succinate (SOLU-MEDROL) 40 mg/mL injection 40 mg  40 mg Intravenous Q12H Graves, Raeford Razor, NP   40 mg at 12/11/20 0804   norepinephrine (LEVOPHED) '4mg'$  in 255m premix infusion  2-12 mcg/min Intravenous Titrated GMilus Banister NP 41.3 mL/hr at 12/11/20 0822 11 mcg/min at 12/11/20 0822   ondansetron (ZOFRAN) injection 4 mg  4 mg Intravenous Q6H PRN GElwyn Reach MD       potassium chloride SA (KLOR-CON) CR tablet 20 mEq  20 mEq Oral Daily GGala RomneyL, MD        pravastatin (PRAVACHOL) tablet 20 mg  20 mg Oral q1800 Garba, Mohammad L, MD       sodium chloride flush (NS) 0.9 % injection 3 mL  3 mL Intravenous Q12H GGala RomneyL, MD   3 mL at 12/11/20 0804   sodium chloride flush (NS) 0.9 % injection 3 mL  3 mL Intravenous PRN GElwyn Reach MD       Warfarin - Pharmacist Dosing Inpatient   Does not apply q1600 [facility]         Allergies: Allergies  Allergen Reactions   Penicillin V     Other reaction(s): Unknown Other reaction(s): Unknown As a child    Penicillins       Past Medical History: Past Medical History:  Diagnosis Date   A-fib ([facility]    CHF (congestive heart failure) ([facility]    Coronary artery disease    Hypertension    Renal disorder      Past Surgical History: History reviewed. No pertinent surgical history.   Family History: History reviewed. No pertinent family history.   Social History: Social History   Socioeconomic History   Marital status: Single    Spouse name: Not on file   Number of children: Not on file   Years of education: Not on file   Highest education level: Not on file  Occupational History   Not on file  Tobacco Use   Smoking status: Never    Passive exposure: Never   Smokeless tobacco: Never  Substance and Sexual Activity   Alcohol use: Not Currently   Drug use: Not Currently   Sexual activity: Not on file  Other Topics Concern   Not on file  Social History Narrative   Not on file   Social Determinants of Health   Financial Resource Strain: Not on file  Food Insecurity: Not on file  Transportation Needs: Not on file  Physical Activity: Not on file  Stress: Not on file  Social Connections: Not on file  Intimate Partner Violence: Not on file     Review of Systems: As per HPI  Vital Signs: Blood pressure (!) 96/55, pulse (!) 107, temperature 98.9 F (37.2 C), temperature source Axillary, resp. rate (!) 22, height 6' (1.829 m), weight  123.6 kg, SpO2  94 %.  Weight trends: Filed Weights   12/09/20 2141 12/10/20 0316 12/11/20 0125  Weight: 127 kg 127 kg 123.6 kg    Physical Exam: Physical Exam: General:  No acute distress  Head:  Normocephalic, atraumatic. Moist oral mucosal membranes  Eyes:  Anicteric  Neck:  Supple  Lungs:  Bilateral crackles and wheezes.  Heart:  S1S2 no rubs  Abdomen:   Soft, nontender, bowel sounds present  Extremities: 3+ peripheral edema.  Neurologic: Lethargic  Skin:  No lesions  Access:     Lab results:  Basic Metabolic Panel: Recent Labs  Lab 12/09/20 1445 12/10/20 0427 12/10/20 2009 12/10/20 2258 12/11/20 0636  NA 138 139 139  --  137  K 3.7 4.6 5.5* 5.2* 5.3*  CL 103 102 100  --  100  CO2 30 32 32  --  31  GLUCOSE 220* 170* 122*  --  214*  BUN 14 18 30*  --  42*  CREATININE 1.45* 1.66* 2.53*  --  2.74*  CALCIUM 8.7* 8.4* 8.3*  --  8.5*  MG  --   --   --   --  2.0  PHOS  --   --   --   --  5.2*    CBC: Recent Labs  Lab 12/09/20 1445 12/10/20 2009 12/11/20 0636  WBC 4.7 10.1 10.8*  HGB 10.0* 9.9* 10.1*  HCT 32.6* 34.4* 33.9*  MCV 90.3 95.6 93.4  PLT 122* 104* 108*    Microbiology: Results for orders placed or performed during the hospital encounter of 12/09/20  Resp Panel by RT-PCR (Flu A&B, Covid) Nasopharyngeal Swab     Status: None   Collection Time: 12/09/20  5:11 PM   Specimen: Nasopharyngeal Swab; Nasopharyngeal(NP) swabs in vial transport medium  Result Value Ref Range Status   SARS Coronavirus 2 by RT PCR NEGATIVE NEGATIVE Final    Comment: (NOTE) SARS-CoV-2 target nucleic acids are NOT DETECTED.  The SARS-CoV-2 RNA is generally detectable in upper respiratory specimens during the acute phase of infection. The lowest concentration of SARS-CoV-2 viral copies this assay can detect is 138 copies/mL. A negative result does not preclude SARS-Cov-2 infection and should not be used as the sole basis for treatment or other patient management decisions. A negative  result may occur with  improper specimen collection/handling, submission of specimen other than nasopharyngeal swab, presence of viral mutation(s) within the areas targeted by this assay, and inadequate number of viral copies(<138 copies/mL). A negative result must be combined with clinical observations, patient history, and epidemiological information. The expected result is Negative.  Fact Sheet for Patients:  EntrepreneurPulse.com.au  Fact Sheet for Healthcare Providers:  IncredibleEmployment.be  This test is no t yet approved or cleared by the [place] FDA and  has been authorized for detection and/or diagnosis of SARS-CoV-2 by FDA under an Emergency Use Authorization (EUA). This EUA will remain  in effect (meaning this test can be used) for the duration of the COVID-19 declaration under Section 564(b)(1) of the Act, 21 U.S.C.section 360bbb-3(b)(1), unless the authorization is terminated  or revoked sooner.       Influenza A by PCR NEGATIVE NEGATIVE Final   Influenza B by PCR NEGATIVE NEGATIVE Final    Comment: (NOTE) The Xpert Xpress SARS-CoV-2/FLU/RSV plus assay is intended as an aid in the diagnosis of influenza from Nasopharyngeal swab specimens and should not be used as a sole basis for treatment. Nasal washings and aspirates are unacceptable for Xpert Xpress  SARS-CoV-2/FLU/RSV testing.  Fact Sheet for Patients: EntrepreneurPulse.com.au  Fact Sheet for Healthcare Providers: IncredibleEmployment.be  This test is not yet approved or cleared by the [place] FDA and has been authorized for detection and/or diagnosis of SARS-CoV-2 by FDA under an Emergency Use Authorization (EUA). This EUA will remain in effect (meaning this test can be used) for the duration of the COVID-19 declaration under Section 564(b)(1) of the Act, 21 U.S.C. section 360bbb-3(b)(1), unless the authorization is  terminated or revoked.  Performed at [facility], [address] [place], [place] [address]   MRSA Next Gen by PCR, Nasal     Status: None   Collection Time: 12/10/20  6:22 PM   Specimen: Nasal Mucosa; Nasal Swab  Result Value Ref Range Status   MRSA by PCR Next Gen NOT DETECTED NOT DETECTED Final    Comment: (NOTE) The GeneXpert MRSA Assay (FDA approved for NASAL specimens only), is one component of a comprehensive MRSA colonization surveillance program. It is not intended to diagnose MRSA infection nor to guide or monitor treatment for MRSA infections. Test performance is not FDA approved in patients less than 32 years old. Performed at [facility], [place], [place] [address]     Urinalysis: Recent Labs    12/10/20 1814  COLORURINE AMBER*  LABSPEC 1.013  PHURINE 5.0  GLUCOSEU NEGATIVE  HGBUR NEGATIVE  BILIRUBINUR NEGATIVE  KETONESUR NEGATIVE  PROTEINUR 30*  NITRITE NEGATIVE  LEUKOCYTESUR NEGATIVE     Imaging:  DG Chest 2 View  Result Date: 12/09/2020 CLINICAL DATA:  Shortness of breath, lower extremity swelling. Chronic cough. EXAM: CHEST - 2 VIEW COMPARISON:  Chest radiograph 04/27/2008 FINDINGS: Small to moderate right pleural effusion with right lower lobe atelectasis. Diffuse hazy interstitial thickening, consistent with pulmonary edema. No pneumothorax. Heart size is difficult to evaluate due to adjacent consolidation but appears enlarged, stable. Mildly tortuous thoracic aorta with calcific atherosclerosis. No acute osseous abnormality. IMPRESSION: Pulmonary edema with small to moderate right pleural effusion. Stable cardiomegaly. Electronically Signed   By: Ileana Roup M.D.   On: 12/09/2020 15:27   CT HEAD WO CONTRAST (5MM)  Result Date: 12/10/2020 CLINICAL DATA:  Delirium.  Acute mental status change. EXAM: CT HEAD WITHOUT CONTRAST TECHNIQUE: Contiguous axial images were obtained from the base of the skull through the  vertex without intravenous contrast. COMPARISON:  None. FINDINGS: The study is mildly motion degraded. Brain: There is no evidence of an acute cortically based infarct, intracranial hemorrhage, mass, midline shift, or extra-axial fluid collection. Hypodensities in the cerebral white matter bilaterally are nonspecific but compatible with mild-to-moderate chronic small vessel ischemic disease. Lacunar infarcts in the left greater than right basal ganglia are chronic in appearance. There are age indeterminate lacunar infarcts in the left corona radiata and left thalamus. The ventricles and sulci are within normal limits for age. Vascular: Calcified atherosclerosis at the skull base. No hyperdense vessel. Skull: No fracture or suspicious osseous lesion. Sinuses/Orbits: Large mucous retention cyst in the right sphenoid sinus. Clear mastoid air cells. Unremarkable orbits. Other: None. IMPRESSION: 1. No evidence of an acute cortically based infarct or intracranial hemorrhage. 2. Age indeterminate lacunar infarcts in the left corona radiata and left thalamus. 3. Chronic small vessel ischemia with chronic lacunar infarcts in the basal ganglia. Electronically Signed   By: Logan Bores M.D.   On: 12/10/2020 13:26   DG Chest Port 1 View  Result Date: 12/10/2020 CLINICAL DATA:  The patient is unresponsive. EXAM: PORTABLE CHEST  1 VIEW COMPARISON:  December 09, 2020 FINDINGS: Cardiomegaly is stable. The hila and mediastinum are unremarkable. Layering effusion and underlying opacity in the right base. Increased interstitial markings bilaterally. No other acute abnormalities. IMPRESSION: 1. Right-sided layering pleural effusion with underlying opacity. 2. Cardiomegaly and mild edema. Electronically Signed   By: Dorise Bullion III M.D.   On: 12/10/2020 18:59     Assessment & Plan:   Principal Problem:   Acute on chronic combined systolic and diastolic CHF, NYHA class 3 (HCC) Active Problems:   Benign essential  hypertension   CAD (coronary artery disease), native coronary artery   Chronic kidney disease, stage III (moderate) (HCC)   Chronic systolic heart failure (HCC)   Current use of long term anticoagulation   GERD (gastroesophageal reflux disease)   Hyperlipidemia   Hypertension   PAF (paroxysmal atrial fibrillation) (HCC)   Secondary hyperparathyroidism of renal origin ([place])   Type II diabetes mellitus with complication (HCC)   Acute on chronic congestive heart failure (HCC)   Acute on chronic respiratory failure with hypoxia ([place])   70 y.o. male with a PMHx of hypertension, coronary artery disease, congestive heart failure, chronic kidney disease stage IIIa, anemia now with history of worsening leg edema, shortness of breath and orthopnea.  Patient also had paroxysmal nocturnal dyspnea.  Initially he was admitted for treatment of congestive heart failure.  Patient subsequently became lethargic with worsening shortness of breath and leg edema and is transferred to the intensive care unit.  Patient is presently on BiPAP.    #1: CHF: Patient most likely has right-sided heart failure with 3+ edema.  He also is hypotensive. Would like to start him on furosemide drip at 5 mg an hour with 40 mg IV bolus.  We will maintain the pressors to maintain systolic blood pressure above 90.  If the urine output does not improve by tomorrow morning we will start him on CRRT.  #2: Chronic kidney disease: Patient has CKD stage IIIa most likely due to chronic hypertensive nephrosclerosis.  Patient now has acute kidney injury secondary to ischemic nephropathy due to decreased cardiac output.  #3: Anemia: Anemia is most likely secondary to chronic kidney disease.  Case discussed with intensivist and ICU team. LOS: [place], [facility]. 8/28/20229:37 AM

## 2020-12-11 NOTE — Progress Notes (Signed)
NAME:  HAIRO DOOLY, MRN:  [id number], DOB:  1950/12/13, LOS: 2 ADMISSION DATE:  12/09/2020, INITIAL CONSULTATION DATE: 12/10/2020 REFERRING MD: Dr. 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  ED course: Upon arrival to the ER pt alert and oriented with O2 sats in the mid 80's on RA.  Pt initially placed on 2L O2 via nasal canula, however O2 increased to 5L due to continued hypoxia.  Lab results revealed glucose 220, creatinine 1.45, BNP 590.9, hgb 10.0, platelets 122, PT 35.7, and INR 3.6.  COVID-19/Influenza PCR negative, however CXR concerning for pulmonary edema and small to moderate right pleural effusion.  He was subsequently admitted to the [facility] unit for additional workup and and treatment per hospitalist team.   Hospital course:  Complicated on [id number] due to pt becoming lethargic and hypotensive with development of worsening acute hypoxic respiratory failure.  He required transfer to the stepdown unit for continuous Bipap and possible levophed gtt.  CXR concerning for right sided pleural effusion with opacity and mild pulmonary edema.  ABG revealed pH 7.15/pCO2 101/pO2 59/acid-base excess 4.2/bicarb 35.2.  PCCM consulted to assist management.    Pertinent  Medical History  Former Smoker Stage 3b CKD  Paroxysmal Atrial Fibrillation  CAD HTN Chronic Systolic CHF  Cardiomyopathy   Significant Hospital Events: Including procedures, antibiotic start and stop dates in addition to other pertinent events   Pt admitted to the [facility] unit with acute on chronic systolic CHF exacerbation  Pt transferred to the stepdown unit with worsening acute hypercapnic hypoxic respiratory failure and hypotension requiring Bipap.  PCCM consulted to assist with management Cultures:  Resp Panel by RT-PCR  08/26>>negative  MRSA PCR 08/27>> Urine 08/27>> Antimicrobials:  Levaquin 08/27>> Interim History / Subjective:  -Remains on BiPAP -Still requiring Levophed -3+ edema  OBJECTIVE   Blood pressure (!) 96/55, pulse (!) 107, temperature 98.9 F (37.2 C), temperature source Axillary, resp. rate (!) 22, height 6' (1.829 m), weight 123.6 kg, SpO2 94 %.    FiO2 (%):  [60 %] 60 %   Intake/Output Summary (Last 24 hours) at 12/11/2020 0916 Last data filed at 12/11/2020 [id number] Gross per 24 hour  Intake 1000.8 ml  Output 300 ml  Net 700.8 ml   Filed Weights   12/09/20 2141 12/10/20 0316 12/11/20 0125  Weight: 127 kg 127 kg 123.6 kg   Examination: GENERAL:age-year-old patient lying in the bed with no acute distress.  On BiPAP EYES: Pupils equal, round, reactive to light and accommodation. No scleral icterus. Extraocular muscles intact.  HEENT: Head atraumatic, normocephalic. Oropharynx and nasopharynx clear.  NECK:  Supple, no jugular venous distention. No thyroid enlargement, no tenderness.  LUNGS: Normal breath sounds bilaterally, no wheezing, rales,rhonchi or crepitation. No use of accessory muscles of respiration.  CARDIOVASCULAR: S1, S2 normal. No murmurs, rubs, or gallops.  ABDOMEN: Soft, nontender, nondistended. Bowel sounds present. No organomegaly or mass.  EXTREMITIES: Generalized 3+ peripheral edema, cyanosis, or clubbing.  NEUROLOGIC: Cranial nerves II through XII are intact. Muscle strength 5/5 in all extremities. Sensation intact. Gait not checked.  PSYCHIATRIC: The patient is alert and oriented x 3.  SKIN: No obvious rash, lesion, or ulcer.   Labs/imaging that I havepersonally reviewed  (right click and "Reselect all SmartList Selections" daily)   Labs   CBC: Recent Labs  Lab 12/09/20 1445 12/10/20 2009  12/11/20 0636  WBC 4.7 10.1 10.8*  HGB 10.0* 9.9* 10.1*  HCT 32.6* 34.4* 33.9*  MCV 90.3 95.6 93.4  PLT 122* 104* 108*    Basic Metabolic Panel: Recent Labs   Lab 12/09/20 1445 12/10/20 0427 12/10/20 2009 12/10/20 2258 12/11/20 0636  NA 138 139 139  --  137  K 3.7 4.6 5.5* 5.2* 5.3*  CL 103 102 100  --  100  CO2 30 32 32  --  31  GLUCOSE 220* 170* 122*  --  214*  BUN 14 18 30*  --  42*  CREATININE 1.45* 1.66* 2.53*  --  2.74*  CALCIUM 8.7* 8.4* 8.3*  --  8.5*  MG  --   --   --   --  2.0  PHOS  --   --   --   --  5.2*   GFR: Estimated Creatinine Clearance: 35 mL/min (A) (by C-G formula based on SCr of 2.74 mg/dL (H)). Recent Labs  Lab 12/09/20 1445 12/10/20 0427 12/10/20 2009 12/10/20 2258 12/11/20 0636  PROCALCITON  --  5.34  --   --   --   WBC 4.7  --  10.1  --  10.8*  LATICACIDVEN  --   --  1.7 1.5  --     Liver Function Tests: No results for input(s): AST, ALT, ALKPHOS, BILITOT, PROT, ALBUMIN in the last 168 hours. No results for input(s): LIPASE, AMYLASE in the last 168 hours. No results for input(s): AMMONIA in the last 168 hours.  ABG    Component Value Date/Time   PHART 7.24 (L) 12/10/2020 2129   PCO2ART 75 (HH) 12/10/2020 2129   PO2ART 83 12/10/2020 2129   HCO3 32.1 (H) 12/10/2020 2129   O2SAT 94.1 12/10/2020 2129     Coagulation Profile: Recent Labs  Lab 12/09/20 1445 12/10/20 0427 12/11/20 0636  INR 3.6* 3.6* 4.8*    Cardiac Enzymes: No results for input(s): CKTOTAL, CKMB, CKMBINDEX, TROPONINI in the last 168 hours.  HbA1C: Hgb A1c MFr Bld  Date/Time Value Ref Range Status  12/10/2020 04:27 AM 6.8 (H) 4.8 - 5.6 % Final    Comment:    (NOTE) Pre diabetes:          5.7%-6.4%  Diabetes:              >6.4%  Glycemic control for   <7.0% adults with diabetes     CBG: Recent Labs  Lab 12/10/20 1828 12/10/20 2057 12/10/20 2231 12/11/20 0100 12/11/20 0739  GLUCAP 101* 123* 136* 140* 203*    Allergies Allergies  Allergen Reactions   Penicillin V     Other reaction(s): Unknown Other reaction(s): Unknown As a child    Penicillins      Home Medications  Prior to Admission  medications   Medication Sig Start Date End Date Taking? Authorizing Provider  acetaminophen (TYLENOL) 650 MG CR tablet Take 650 mg by mouth as needed for pain.   Yes [provider]  amLODipine (NORVASC) 10 MG tablet Take 10 mg by mouth daily. 10/26/20  Yes [provider]  calcitRIOL (ROCALTROL) 0.25 MCG capsule Take 1 capsule by mouth 3 (three) times a week. 12/06/20  Yes [provider]  carvedilol (COREG) 25 MG tablet Take 25 mg by mouth 2 (two) times daily. 10/26/20  Yes [provider]  enalapril (VASOTEC) 20 MG tablet Take 20 mg by mouth daily. 10/26/20  Yes [provider]  furosemide (LASIX) 40 MG tablet Take 40 mg  by mouth daily. 10/26/20  Yes [provider]  hydrALAZINE (APRESOLINE) 50 MG tablet Take 50 mg by mouth 4 (four) times daily. 10/26/20  Yes [provider]  potassium chloride SA (KLOR-CON) 20 MEQ tablet Take 20 mEq by mouth daily. 10/26/20  Yes [provider]  pravastatin (PRAVACHOL) 20 MG tablet Take 20 mg by mouth daily as needed. 10/26/20  Yes [provider]  warfarin (COUMADIN) 4 MG tablet Take 4 mg by mouth daily. 10/26/20  Yes [provider]  Scheduled Meds:  budesonide (PULMICORT) nebulizer solution  0.5 mg Nebulization BID   calcitRIOL  0.25 mcg Oral Once per day on Mon Wed Fri   Chlorhexidine Gluconate Cloth  6 each Topical Daily   insulin aspart  0-15 Units Subcutaneous TID WC   insulin aspart  0-5 Units Subcutaneous QHS   methylPREDNISolone (SOLU-MEDROL) injection  40 mg Intravenous Q12H   potassium chloride SA  20 mEq Oral Daily   pravastatin  20 mg Oral q1800   sodium chloride flush  3 mL Intravenous Q12H   Warfarin - Pharmacist Dosing Inpatient   Does not apply q1600   Continuous Infusions:  sodium chloride     sodium chloride Stopped (12/11/20 0802)   furosemide (LASIX) 200 mg in dextrose 5% 100 mL ('2mg'$ /mL) infusion     [START ON 12/12/2020] levofloxacin (LEVAQUIN) IV      norepinephrine (LEVOPHED) Adult infusion 11 mcg/min (12/11/20 0822)   PRN Meds:.sodium chloride, acetaminophen, levalbuterol, ondansetron (ZOFRAN) IV, sodium chloride flush  ASSESSMENT & PLAN  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 failure secondary to acute systolic CHF exacerbation, right sided pleural effusion, and possible pneumonia  Hx: Former Smoker and COPD  -Supplemental O2 as needed to maintain O2 saturations 88 to 92% -BiPAP, wean as tolerated -Follow intermittent ABG and chest x-ray as needed -Trend WBC and monitor fever curve -Follow cultures  -Continue iv levaquin  -Pending IR Right Sided Thoracentesis  -IV Lasix  40 x 1 dose, start IV Lasix gtt at per nephrology recommendation  -As needed bronchodilators   Acute on chronic systolic CHF exacerbation~EF 50 to 55% via Echo 12/10/20  Hypotension concerning for sepsis with septic shock  Paroxysmal atrial fibrillation on chronic coumadin-INR supratherapeutic on admission~3.6 Hx: Cardiomyopathy, HTN, and CAD  -Continuous telemetry monitoring  -Hold outpatient antihypertensives for now  -Continue outpatient pravachol  -Prn levophed gtt to maintain map >65 -Continue IV Lasix as above -Coumadin dosing per pharmacy   -Consider Cardiology consult to assist with management    Acute on chronic renal failure (stage III) -Monitor I&O's / urinary output -Follow BMP -Ensure adequate renal perfusion -Avoid nephrotoxic agents as able -Replace electrolytes as indicated -Start IV Lasix as above -Nephrology input appreciated   Type II diabetes mellitus: Hemoglobin A1C 08/27~6.8 -CBGs -Sliding scale insulin -Follow ICU hyper/hypoglycemia protocol    Acute encephalopathy secondary to hypercapnia~CT Head 08/27 negative for acute abnormality  -Avoid sedating medication for now  -Frequent reorientation   Best practice (right click and "Reselect all SmartList Selections" daily)  Diet:  NPO Pain/Anxiety/Delirium protocol  (if indicated): No VAP protocol (if indicated): Not indicated DVT prophylaxis: Systemic AC GI prophylaxis: N/A Glucose control:  SSI Yes Central venous access:  N/A Arterial line:  N/A Foley:  Yes, and it is still needed Mobility:  bed rest  PT consulted: N/A Last date of multidisciplinary goals of care discussion [8/28] Code Status:  DNR Disposition: [facility]  Critical care time: 45  minutes  Rufina Falco, DNP, FNP-C, AGACNP-BC Acute Care Nurse Practitioner  [facility] Pager: [phone] [place] [facility]

## 2020-12-11 NOTE — Consult Note (Signed)
ANTICOAGULATION CONSULT NOTE  Pharmacy Consult for warfarin Indication: atrial fibrillation  Allergies  Allergen Reactions   Penicillin V     Other reaction(s): Unknown Other reaction(s): Unknown As a child    Penicillins     Patient Measurements: Height: 6' (182.9 cm) Weight: 123.6 kg (272 lb 7.8 oz) IBW/kg (Calculated) : 77.6   Vital Signs: Temp: 98.9 F (37.2 C) (08/28 0800) Temp Source: Axillary (08/28 0800) BP: 96/55 (08/28 0800) Pulse Rate: 107 (08/28 0800)  Labs: Recent Labs    12/09/20 1445 12/09/20 1711 12/09/20 2206 12/10/20 0427 12/10/20 2009 12/11/20 0636  HGB 10.0*  --   --   --  9.9* 10.1*  HCT 32.6*  --   --   --  34.4* 33.9*  PLT 122*  --   --   --  104* 108*  LABPROT 35.7*  --   --  35.6*  --  45.0*  INR 3.6*  --   --  3.6*  --  4.8*  CREATININE 1.45*  --   --  1.66* 2.53* 2.74*  TROPONINIHS  --  6 8  --   --   --      Estimated Creatinine Clearance: 35 mL/min (A) (by C-G formula based on SCr of 2.74 mg/dL (H)).   Medical History: Past Medical History:  Diagnosis Date   A-fib (HCC)    CHF (congestive heart failure) (HCC)    Coronary artery disease    Hypertension    Renal disorder     Medications:  Chronic Warfarin regimen: 4 mg daily (TWD = 28 mg)  Assessment: 70 y.o. male with medical history paroxysmal atrial fibrillation stable on chronic warfarin regimen as above. Presented to the ER with progressive shortness of breath lower extremity swelling, orthopnea and PND over the last week.  Scr trending up 1.45>1.66  INR supratherapeutic (3.6) on admission. No new outpatient medication identified (patient not taking prednisone -- verified with patient/wife) possibly 2/2 fluid overload/congestion?  Date INR Warfarin Dose  8/25 -- 4 mg (PTA) 8/26 3.6 Hold  8/27 3.6 Hold 8/28  4.8 Hold  HGB stable since admission, Scr worsening 1.45>2.53>2.74  Goal of Therapy:  INR 2-3 Monitor platelets by anticoagulation protocol: Yes    Plan:  INR 4.8 (still supratherapeutic, now climbing) Withhold dose again tonight INR daily   Lu Duffel, PharmD, BCPS Clinical Pharmacist   12/11/2020,8:30 AM

## 2020-12-11 NOTE — Progress Notes (Addendum)
[facility] [place] NAME: Timothy Chavez    MR#:  [id number]  DATE OF BIRTH:  16-Sep-1950  SUBJECTIVE:   patient much awake and alert. Currently using BiPAP. Able to communicate well. Sister Arbie Cookey and at bedside. Urine output poor. REVIEW OF SYSTEMS:   Review of Systems  Constitutional:  Negative for chills, fever and weight loss.  HENT:  Negative for ear discharge, ear pain and nosebleeds.   Eyes:  Negative for blurred vision, pain and discharge.  Respiratory:  Positive for shortness of breath. Negative for sputum production, wheezing and stridor.   Cardiovascular:  Negative for chest pain, palpitations, orthopnea and PND.  Gastrointestinal:  Negative for abdominal pain, diarrhea, nausea and vomiting.  Genitourinary:  Negative for frequency and urgency.  Musculoskeletal:  Negative for back pain and joint pain.  Neurological:  Negative for sensory change, speech change, focal weakness and weakness.  Psychiatric/Behavioral:  Negative for depression and hallucinations. The patient is not nervous/anxious.   Tolerating Diet:yes Tolerating PT:   DRUG ALLERGIES:   Allergies  Allergen Reactions   Penicillin V     Other reaction(s): Unknown Other reaction(s): Unknown As a child    Penicillins     VITALS:  Blood pressure 97/77, pulse (!) 108, temperature 98.4 F (36.9 C), temperature source Oral, resp. rate (!) 26, height 6' (1.829 m), weight 123.6 kg, SpO2 93 %.  PHYSICAL EXAMINATION:   Physical Exam  GENERAL:  70 y.o.-year-old patient lying in the bed with no acute distress.  BIPAP+ LUNGS: Normal breath sounds bilaterally, no wheezing, rales, rhonchi. No use of accessory muscles of respiration.  CARDIOVASCULAR: S1, S2 normal. No murmurs, rubs, or gallops.  ABDOMEN: Soft, nontender, nondistended. Bowel sounds present. No organomegaly or mass. FOLEY+ EXTREMITIES: No cyanosis, clubbing or edema b/l.    NEUROLOGIC: non  focal PSYCHIATRIC:  patient is alert and oriented x 2.  SKIN: No obvious rash, lesion, or ulcer.   LABORATORY PANEL:  CBC Recent Labs  Lab 12/11/20 0636  WBC 10.8*  HGB 10.1*  HCT 33.9*  PLT 108*    Chemistries  Recent Labs  Lab 12/11/20 0636  NA 137  K 5.3*  CL 100  CO2 31  GLUCOSE 214*  BUN 42*  CREATININE 2.74*  CALCIUM 8.5*  MG 2.0   Cardiac Enzymes No results for input(s): TROPONINI in the last 168 hours. RADIOLOGY:  CT HEAD WO CONTRAST (5MM)  Result Date: 12/10/2020 CLINICAL DATA:  Delirium.  Acute mental status change. EXAM: CT HEAD WITHOUT CONTRAST TECHNIQUE: Contiguous axial images were obtained from the base of the skull through the vertex without intravenous contrast. COMPARISON:  None. FINDINGS: The study is mildly motion degraded. Brain: There is no evidence of an acute cortically based infarct, intracranial hemorrhage, mass, midline shift, or extra-axial fluid collection. Hypodensities in the cerebral white matter bilaterally are nonspecific but compatible with mild-to-moderate chronic small vessel ischemic disease. Lacunar infarcts in the left greater than right basal ganglia are chronic in appearance. There are age indeterminate lacunar infarcts in the left corona radiata and left thalamus. The ventricles and sulci are within normal limits for age. Vascular: Calcified atherosclerosis at the skull base. No hyperdense vessel. Skull: No fracture or suspicious osseous lesion. Sinuses/Orbits: Large mucous retention cyst in the right sphenoid sinus. Clear mastoid air cells. Unremarkable orbits. Other: None. IMPRESSION: 1. No evidence of an acute cortically based infarct or intracranial hemorrhage. 2. Age indeterminate lacunar infarcts in the left corona radiata  and left thalamus. 3. Chronic small vessel ischemia with chronic lacunar infarcts in the basal ganglia. Electronically Signed   By: Logan Bores M.D.   On: 12/10/2020 13:26   DG Chest Port 1 View  Result Date:  12/11/2020 CLINICAL DATA:  Worsening leg edema, shortness of breath. History of congestive heart failure. EXAM: PORTABLE CHEST 1 VIEW COMPARISON:  Chest x-rays dated 12/10/2020, 12/09/2020 and 04/27/2008 FINDINGS: Stable cardiomegaly. Prominent interstitial markings are again seen bilaterally. Hazy opacity persists at the RIGHT lung base, likely indicating small RIGHT pleural effusion. No new lung findings. No pneumothorax is seen. IMPRESSION: 1. Stable chest x-ray. Probable small RIGHT pleural effusion. Underlying pneumonia not excluded. 2. Stable cardiomegaly and mild interstitial prominence, likely a chronic mild CHF. Electronically Signed   By: Franki Cabot M.D.   On: 12/11/2020 12:00   DG Chest Port 1 View  Result Date: 12/10/2020 CLINICAL DATA:  The patient is unresponsive. EXAM: PORTABLE CHEST 1 VIEW COMPARISON:  December 09, 2020 FINDINGS: Cardiomegaly is stable. The hila and mediastinum are unremarkable. Layering effusion and underlying opacity in the right base. Increased interstitial markings bilaterally. No other acute abnormalities. IMPRESSION: 1. Right-sided layering pleural effusion with underlying opacity. 2. Cardiomegaly and mild edema. Electronically Signed   By: Dorise Bullion III M.D.   On: 12/10/2020 18:59   ASSESSMENT AND PLAN:   Timothy Chavez is a 70 y.o. male with medical history significant of systolic dysfunction CHF with EF of 45% from echocardiogram on May 10, 2020, paroxysmal atrial fibrillation, coronary artery disease, chronic kidney disease stage III, essential hypertension, GERD, and diabetes type 2 who presented to the ER with progressive shortness of breath lower extremity swelling, orthopnea and PND over the last week.    Acute on chronic systolic dysfunction CHF/cardiogenic shock/?cardiorenal syndrome acute on chronic COPD exacerbation with hypoxia and hypercapnia/Right Pleural effusion Acute metabolic encephalopathy -- IV Lasix 40 mg BID, monitor input output  daily weight  -- PRN inhalers/nebulizer  -- patient follows with [facility] cardiology -- follow-up echocardiogram  -- CT head negative for acute stroke. Positive for chronic small vessel disease and lacunar infarct chronic -- patient much alert awake after using BiPAP --8/28-- patient started on IV Lasix drip per nephrology recommendation. Poor urine output -- empirically on Levaquin (procal 5.3 ) wbc 10K -- holding Coreg and ACE inhibitor -- patient on IV norepinephrine -- repeat chest x-ray morning. Consider right-sided thoracentesis if no improvement    coronary artery disease: -- stable. Troponin normal     diabetes type II with chronic kidney disease stage IIIa -- initiate sliding scale insulin.   Acute on chronic kidney disease stage IIIa -- baseline creatinine 1.5-- 1.8 -- came in with creatinine of 1.4----1.6--2.5--2.74 -- patient seen by nephrology. Poor urine output. Recommend  CRRT if no improvement   H/o Essential hypertension Hypotension --Blood pressure appears relatively low -- Continue carvedilol    Hyperlipidemia --Continue pravastatin.   Paroxysmal atrial fibrillation: Rate is controlled.   --Continue carvedilol and warfarin   history of COPD with Ex- tobacco Suspected sleep apnea -- PRN inhalers/nebulizer -- patient will benefit from outpatient sleep study   paroxysmal atrial fibrillation -- on Coreg and PO warfarin. INR therapeutic     DVT prophylaxis: Warfarin Code Status: DNR per orders Family Communication: sister at bedside Disposition Plan: Home Consults called: None              TOTAL TIME TAKING CARE OF THIS PATIENT: 35 minutes.  >50% time spent on  counselling and coordination of care  Note: This dictation was prepared with Dragon dictation along with smaller phrase technology. Any transcriptional errors that result from this process are unintentional.  Fritzi Mandes M.D    [facility]   CC: Primary care physician; Idelle Crouch, MD Patient ID: [name], male   DOB: [birth year], 70 y.o.   MRN: [id number]

## 2020-12-12 DIAGNOSIS — I5043 Acute on chronic combined systolic (congestive) and diastolic (congestive) heart failure: Secondary | ICD-10-CM | POA: Diagnosis not present

## 2020-12-12 LAB — COMPREHENSIVE METABOLIC PANEL
ALT: 10 U/L (ref 0–44)
AST: 15 U/L (ref 15–41)
Albumin: 2.4 g/dL — ABNORMAL LOW (ref 3.5–5.0)
Alkaline Phosphatase: 55 U/L (ref 38–126)
Anion gap: 4 — ABNORMAL LOW (ref 5–15)
BUN: 64 mg/dL — ABNORMAL HIGH (ref 8–23)
CO2: 32 mmol/L (ref 22–32)
Calcium: 8.8 mg/dL — ABNORMAL LOW (ref 8.9–10.3)
Chloride: 98 mmol/L (ref 98–111)
Creatinine, Ser: 2.97 mg/dL — ABNORMAL HIGH (ref 0.61–1.24)
GFR, Estimated: 22 mL/min — ABNORMAL LOW (ref >60)
Glucose, Bld: 219 mg/dL — ABNORMAL HIGH (ref 70–99)
Potassium: 4.9 mmol/L (ref 3.5–5.1)
Sodium: 134 mmol/L — ABNORMAL LOW (ref 135–145)
Total Bilirubin: 0.7 mg/dL (ref 0.3–1.2)
Total Protein: 6.2 g/dL — ABNORMAL LOW (ref 6.5–8.1)

## 2020-12-12 LAB — GLUCOSE, CAPILLARY
Glucose-Capillary: 205 mg/dL — ABNORMAL HIGH (ref 70–99)
Glucose-Capillary: 184 mg/dL — ABNORMAL HIGH (ref 70–99)
Glucose-Capillary: 186 mg/dL — ABNORMAL HIGH (ref 70–99)
Glucose-Capillary: 174 mg/dL — ABNORMAL HIGH (ref 70–99)

## 2020-12-12 LAB — CBC
HCT: 30.5 % — ABNORMAL LOW (ref 39.0–52.0)
Hemoglobin: 9.3 g/dL — ABNORMAL LOW (ref 13.0–17.0)
MCH: 27.4 pg (ref 26.0–34.0)
MCHC: 30.5 g/dL (ref 30.0–36.0)
MCV: 89.7 fL (ref 80.0–100.0)
Platelets: 109 10*3/uL — ABNORMAL LOW (ref 150–400)
RBC: 3.4 MIL/uL — ABNORMAL LOW (ref 4.22–5.81)
RDW: 15.4 % (ref 11.5–15.5)
WBC: 7.5 10*3/uL (ref 4.0–10.5)
nRBC: 0 % (ref 0.0–0.2)

## 2020-12-12 LAB — PHOSPHORUS: Phosphorus: 4.2 mg/dL (ref 2.5–4.6)

## 2020-12-12 LAB — BRAIN NATRIURETIC PEPTIDE: B Natriuretic Peptide: 240.8 pg/mL — ABNORMAL HIGH (ref 0.0–100.0)

## 2020-12-12 LAB — PROTIME-INR
INR: 3.9 — ABNORMAL HIGH (ref 0.8–1.2)
Prothrombin Time: 38.4 seconds — ABNORMAL HIGH (ref 11.4–15.2)

## 2020-12-12 LAB — URINE CULTURE: Culture: NO GROWTH

## 2020-12-12 LAB — MAGNESIUM: Magnesium: 2.3 mg/dL (ref 1.7–2.4)

## 2020-12-12 MED ORDER — MIDODRINE HCL 5 MG PO TABS
5.0000 mg | ORAL_TABLET | Freq: Three times a day (TID) | ORAL | Status: DC
  Administered 2020-12-12 – 2020-12-16 (×13): 5 mg via ORAL
  Filled 2020-12-12 (×13): qty 1

## 2020-12-12 MED ORDER — LEVOFLOXACIN 500 MG PO TABS
750.0000 mg | ORAL_TABLET | ORAL | Status: AC
Start: 2020-12-12 — End: 2020-12-14
  Administered 2020-12-12 – 2020-12-14 (×2): 750 mg via ORAL
  Filled 2020-12-12 (×2): qty 1
  Filled 2020-12-12: qty 2

## 2020-12-12 NOTE — Evaluation (Signed)
Occupational Therapy Evaluation Patient Details Name: Timothy Chavez MRN: [id number] DOB: [birth year] Today's Date: 12/12/2020    History of Present Illness 70 y.o male admitted with 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 failure secondary to acute systolic CHF exacerbation, right sided pleural effusion, and possible pneumonia.   Clinical Impression   Timothy Chavez was seen for OT/PT co-evaluation this date, seen together for lines/leads mgmt. Prior to hospital admission, pt was Independent for mobility and I/ADLs. Pt lives alone in home c ramped entrance. Pt presents to acute OT demonstrating impaired ADL performance and functional mobility 2/2 decreased activity tolerance, functional strength/balance deficits, and poor safety awareness. Pt currently requires MIN A sup>sit and MOD A sit>sup, assist for BLE mgmt returning to bed. MIN A + RW for ADL t/f and perihygiene in standing. MAX A don B socks seated EOB.  SpO2 90s on 5L [place] t/o session, pt denies shortness of breath. Pt would benefit from skilled OT to address noted impairments and functional limitations (see below for any additional details) in order to maximize safety and independence while minimizing falls risk and caregiver burden. Upon hospital discharge, recommend HHOT to maximize pt safety and return to functional independence during meaningful occupations of daily life.     Follow Up Recommendations  Home health OT;Supervision - Intermittent    Equipment Recommendations  3 in 1 bedside commode    Recommendations for Other Services       Precautions / Restrictions Precautions Precautions: Fall Restrictions Weight Bearing Restrictions: No      Mobility Bed Mobility Overal bed mobility: Needs Assistance Bed Mobility: Sit to Supine;Supine to Sit     Supine to sit: Min assist Sit to supine: Mod assist        Transfers Overall transfer level: Needs assistance Equipment used: Rolling walker (2  wheeled) Transfers: Sit to/from Stand Sit to Stand: Min assist;+2 safety/equipment              Balance Overall balance assessment: Needs assistance Sitting-balance support: No upper extremity supported;Feet supported Sitting balance-Leahy Scale: Fair     Standing balance support: Single extremity supported;During functional activity Standing balance-Leahy Scale: Poor Standing balance comment: requires MIN A for perihygiene standing - assist for balance.                           ADL either performed or assessed with clinical judgement   ADL Overall ADL's : Needs assistance/impaired                                       General ADL Comments: MIN A + RW for ADL t/f and perihygiene in standing. MAX A don B socks seated EOB.      Pertinent Vitals/Pain Pain Assessment: No/denies pain     Hand Dominance Right   Extremity/Trunk Assessment Upper Extremity Assessment Upper Extremity Assessment: Overall WFL for tasks assessed   Lower Extremity Assessment Lower Extremity Assessment: Generalized weakness       Communication Communication Communication: No difficulties   Cognition Arousal/Alertness: Awake/alert Behavior During Therapy: Flat affect;WFL for tasks assessed/performed Overall Cognitive Status: No family/caregiver present to determine baseline cognitive functioning                                 General Comments: poor insight into deficits and  decreased safety awareness and   General Comments  SpO2 90s or higher on 5L [place]    Exercises Exercises: Other exercises Other Exercises Other Exercises: Pt educated re: OT role, DME recs, d/c recs, falls prevention, ECS Other Exercises: LBD, grooming, toileting, sup<>sit, sit<>stand, sitting/standing balance/tolerance   Shoulder Instructions      Home Living Family/patient expects to be discharged to:: Private residence Living Arrangements: Alone Available Help at  Discharge: Family;Available PRN/intermittently Type of Home: House Home Access: Ramped entrance     Home Layout: One level               Home Equipment: Walker - 2 wheels          Prior Functioning/Environment Level of Independence: Independent        Comments: Pt reports no O2 baseline, drives, gets groceries        OT Problem List: Decreased strength;Decreased range of motion;Decreased activity tolerance;Impaired balance (sitting and/or standing);Decreased safety awareness;Cardiopulmonary status limiting activity      OT Treatment/Interventions: Self-care/ADL training;Therapeutic exercise;Energy conservation;DME and/or AE instruction;Therapeutic activities;Patient/family education;Balance training    OT Goals(Current goals can be found in the care plan section) Acute Rehab OT Goals Patient Stated Goal: to go home OT Goal Formulation: With patient Time For Goal Achievement: 12/26/20 Potential to Achieve Goals: Good ADL Goals Pt Will Perform Grooming: Independently;standing Pt Will Perform Lower Body Dressing: with modified independence;sit to/from stand (c AE PRN) Pt Will Transfer to Toilet: Independently;ambulating;regular height toilet Additional ADL Goal #1: Pt will Independently verbalize plan to implement x3 ECS  OT Frequency: Min 2X/week    AM-PAC OT "6 Clicks" Daily Activity     Outcome Measure Help from another person eating meals?: None Help from another person taking care of personal grooming?: A Little Help from another person toileting, which includes using toliet, bedpan, or urinal?: A Little Help from another person bathing (including washing, rinsing, drying)?: A Little Help from another person to put on and taking off regular upper body clothing?: None Help from another person to put on and taking off regular lower body clothing?: A Lot 6 Click Score: 19   End of Session Equipment Utilized During Treatment: Rolling walker Nurse Communication:  Mobility status  Activity Tolerance: Patient tolerated treatment well Patient left: in bed;with call bell/phone within reach  OT Visit Diagnosis: Muscle weakness (generalized) (M62.81)                Time: [id number] OT Time Calculation (min): 20 min Charges:  OT General Charges $OT Visit: 1 Visit OT Evaluation $OT Eval Moderate Complexity: 1 Mod  Dessie Coma, M.S. OTR/L  12/12/20, 3:25 PM  ascom [phone]

## 2020-12-12 NOTE — Progress Notes (Addendum)
NAME:  Timothy Chavez, MRN:  [id number], DOB:  1950-06-05, LOS: 3 ADMISSION DATE:  12/09/2020, INITIAL CONSULTATION DATE: 12/10/2020 REFERRING MD: Dr. 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  ED course: Upon arrival to the ER pt alert and oriented with O2 sats in the mid 80's on RA.  Pt initially placed on 2L O2 via nasal canula, however O2 increased to 5L due to continued hypoxia.  Lab results revealed glucose 220, creatinine 1.45, BNP 590.9, hgb 10.0, platelets 122, PT 35.7, and INR 3.6.  COVID-19/Influenza PCR negative, however CXR concerning for pulmonary edema and small to moderate right pleural effusion.  He was subsequently admitted to the [facility] unit for additional workup and and treatment per hospitalist team.   Hospital course:  Complicated on [id number] due to pt becoming lethargic and hypotensive with development of worsening acute hypoxic respiratory failure.  He required transfer to the stepdown unit for continuous Bipap and possible levophed gtt.  CXR concerning for right sided pleural effusion with opacity and mild pulmonary edema.  ABG revealed pH 7.15/pCO2 101/pO2 59/acid-base excess 4.2/bicarb 35.2.  PCCM consulted to assist management.    Pertinent  Medical History  Former Smoker Stage 3b CKD  Paroxysmal Atrial Fibrillation  CAD HTN Chronic Systolic CHF  Cardiomyopathy   Significant Hospital Events: Including procedures, antibiotic start and stop dates in addition to other pertinent events   Pt admitted to the [facility] unit with acute on chronic systolic CHF exacerbation  Pt transferred to the stepdown unit with worsening acute hypercapnic hypoxic respiratory failure and hypotension requiring Bipap.  PCCM consulted to assist with management Cultures:  Resp Panel by RT-PCR  08/26>>negative  MRSA PCR 08/27>> Urine 08/27>> Antimicrobials:  Levaquin 08/27>> Interim History / Subjective:  Tolerated Bipap overnight now on 5L O2 via nasal canula.  Alert and following commands no complaints at this time.   OBJECTIVE   Blood pressure (!) 97/57, pulse 93, temperature 98.2 F (36.8 C), temperature source Oral, resp. rate 19, height 6' (1.829 m), weight 123.5 kg, SpO2 93 %.        Intake/Output Summary (Last 24 hours) at 12/12/2020 0748 Last data filed at 12/12/2020 0600 Gross per 24 hour  Intake 1385.42 ml  Output 1300 ml  Net 85.42 ml   Filed Weights   12/10/20 0316 12/11/20 0125 12/12/20 0500  Weight: 127 kg 123.6 kg 123.5 kg   Examination: General: well developed, well nourished male, NAD resting in bed   HENT: supple, mild JVD present  Lungs: faint crackles throughout, even, non labored  Cardiovascular: irregular irregular, no R/G, 2+ radial/1+ distal pulses, 3+ bilateral lower extremity pitting edema  Abdomen: +BS x4, obese, soft, non distended  Extremities: moves all extremities  Neuro: alert and oriented, follows commands, PERRLA  GU: indwelling foley draining dark yellow urine   Labs/imaging that I havepersonally reviewed  (right click and "Reselect all SmartList Selections" daily)   Labs   CBC: Recent Labs  Lab 12/09/20 1445 12/10/20 2009 12/11/20 0636 12/12/20 0609  WBC 4.7 10.1 10.8* 7.5  HGB 10.0* 9.9* 10.1* 9.3*  HCT 32.6* 34.4* 33.9* 30.5*  MCV 90.3 95.6 93.4 89.7  PLT 122* 104* 108* 109*    Basic Metabolic Panel: Recent Labs  Lab 12/09/20 1445 12/10/20 0427 12/10/20 2009 12/10/20 2258 12/11/20 0636 12/11/20 2005 12/12/20 0609  NA 138  139 139  --  137  --  134*  K 3.7 4.6 5.5* 5.2* 5.3* 5.3* 4.9  CL 103 102 100  --  100  --  98  CO2 30 32 32  --  31  --  32  GLUCOSE 220* 170* 122*  --  214*  --  219*  BUN 14 18 30*  --  42*  --  64*  CREATININE 1.45* 1.66* 2.53*  --  2.74*  --  2.97*  CALCIUM 8.7* 8.4* 8.3*  --   8.5*  --  8.8*  MG  --   --   --   --  2.0  --  2.3  PHOS  --   --   --   --  5.2*  --  4.2   GFR: Estimated Creatinine Clearance: 32.3 mL/min (A) (by C-G formula based on SCr of 2.97 mg/dL (H)). Recent Labs  Lab 12/09/20 1445 12/10/20 0427 12/10/20 2009 12/10/20 2258 12/11/20 0636 12/12/20 0609  PROCALCITON  --  5.34  --   --   --   --   WBC 4.7  --  10.1  --  10.8* 7.5  LATICACIDVEN  --   --  1.7 1.5  --   --     Liver Function Tests: Recent Labs  Lab 12/12/20 0609  AST 15  ALT 10  ALKPHOS 55  BILITOT 0.7  PROT 6.2*  ALBUMIN 2.4*   No results for input(s): LIPASE, AMYLASE in the last 168 hours. No results for input(s): AMMONIA in the last 168 hours.  ABG    Component Value Date/Time   PHART 7.24 (L) 12/10/2020 2129   PCO2ART 75 (HH) 12/10/2020 2129   PO2ART 83 12/10/2020 2129   HCO3 32.1 (H) 12/10/2020 2129   O2SAT 94.1 12/10/2020 2129     Coagulation Profile: Recent Labs  Lab 12/09/20 1445 12/10/20 0427 12/11/20 0636 12/12/20 0609  INR 3.6* 3.6* 4.8* 3.9*    Cardiac Enzymes: No results for input(s): CKTOTAL, CKMB, CKMBINDEX, TROPONINI in the last 168 hours.  HbA1C: Hgb A1c MFr Bld  Date/Time Value Ref Range Status  12/10/2020 04:27 AM 6.8 (H) 4.8 - 5.6 % Final    Comment:    (NOTE) Pre diabetes:          5.7%-6.4%  Diabetes:              >6.4%  Glycemic control for   <7.0% adults with diabetes     CBG: Recent Labs  Lab 12/11/20 0100 12/11/20 0739 12/11/20 1221 12/11/20 1651 12/11/20 2114  GLUCAP 140* 203* 185* 185* 174*    Allergies Allergies  Allergen Reactions   Penicillin V     Other reaction(s): Unknown Other reaction(s): Unknown As a child    Penicillins      Home Medications  Prior to Admission medications   Medication Sig Start Date End Date Taking? Authorizing Provider  acetaminophen (TYLENOL) 650 MG CR tablet Take 650 mg by mouth as needed for pain.   Yes [provider]  amLODipine (NORVASC) 10 MG  tablet Take 10 mg by mouth daily. 10/26/20  Yes [provider]  calcitRIOL (ROCALTROL) 0.25 MCG capsule Take 1 capsule by mouth 3 (three) times a week. 12/06/20  Yes [provider]  carvedilol (COREG) 25 MG tablet Take 25 mg by mouth 2 (two) times daily. 10/26/20  Yes [provider]  enalapril (VASOTEC) 20 MG tablet Take 20 mg by mouth daily. 10/26/20  Yes [provider]  furosemide (LASIX) 40 MG tablet Take 40 mg by mouth daily. 10/26/20  Yes [provider]  hydrALAZINE (APRESOLINE) 50 MG tablet Take 50 mg by mouth 4 (four) times daily. 10/26/20  Yes [provider]  potassium chloride SA (KLOR-CON) 20 MEQ tablet Take 20 mEq by mouth daily. 10/26/20  Yes [provider]  pravastatin (PRAVACHOL) 20 MG tablet Take 20 mg by mouth daily as needed. 10/26/20  Yes [provider]  warfarin (COUMADIN) 4 MG tablet Take 4 mg by mouth daily. 10/26/20  Yes [provider]  Scheduled Meds:  budesonide (PULMICORT) nebulizer solution  0.5 mg Nebulization BID   calcitRIOL  0.25 mcg Oral Once per day on Mon Wed Fri   Chlorhexidine Gluconate Cloth  6 each Topical Daily   insulin aspart  0-15 Units Subcutaneous TID WC   insulin aspart  0-5 Units Subcutaneous QHS   methylPREDNISolone (SOLU-MEDROL) injection  40 mg Intravenous Q12H   pravastatin  20 mg Oral q1800   sodium chloride flush  3 mL Intravenous Q12H   Warfarin - Pharmacist Dosing Inpatient   Does not apply q1600   Continuous Infusions:  sodium chloride     sodium chloride Stopped (12/11/20 1100)   furosemide (LASIX) 200 mg in dextrose 5% 100 mL ('2mg'$ /mL) infusion 5 mg/hr (12/12/20 0600)   levofloxacin (LEVAQUIN) IV     norepinephrine (LEVOPHED) Adult infusion Stopped (12/12/20 0518)   PRN Meds:.sodium chloride, acetaminophen, levalbuterol, ondansetron (ZOFRAN) IV, sodium chloride flush  ASSESSMENT & PLAN  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 failure secondary  to acute systolic CHF exacerbation, right sided pleural effusion, and possible pneumonia  Hx: Former Smoker and COPD  -Supplemental O2 as needed to maintain O2 saturations 88 to 92% -BiPAP qhs and prn  -Follow intermittent ABG and chest x-ray as needed -Trend WBC and monitor fever curve -Follow cultures  -Continue iv levaquin  -Right Sided Thoracentesis pending  -As needed bronchodilators   Acute on chronic systolic CHF exacerbation~EF 50 to 55% via Echo 12/10/20  Hypotension concerning for sepsis with septic shock  Paroxysmal atrial fibrillation on chronic coumadin-INR supratherapeutic on admission~3.6~4.8~3.9 Hx: Cardiomyopathy, HTN, and CAD  -Continuous telemetry monitoring  -Hold outpatient antihypertensives for now  -Continue outpatient pravachol  -Prn levophed gtt to maintain map >65 -Lasix gtt per nephrology recommendations  -Coumadin dosing per pharmacy    Acute on chronic renal failure (stage IIIa) -Monitor I&O's / urinary output -Follow BMP -Ensure adequate renal perfusion -Avoid nephrotoxic agents as able -Replace electrolytes as indicated -Nephrology input appreciated~Lasix gtt initiated 08/28 per recommendations    Type II diabetes mellitus: Hemoglobin A1C 08/27~6.8 -CBGs -Sliding scale insulin -Follow ICU hyper/hypoglycemia protocol    Acute encephalopathy secondary to hypercapnia~CT Head 08/27 negative for acute abnormality~resolved  -Avoid sedating medication for now  -Frequent reorientation   Best practice (right click and "Reselect all SmartList Selections" daily)  Diet:  Heart Healthy  Pain/Anxiety/Delirium protocol (if indicated): No VAP protocol (if indicated): Not indicated DVT prophylaxis: Systemic AC GI prophylaxis: N/A Glucose control:  SSI Yes Central venous access:  N/A Arterial line:  N/A Foley:  Yes, and it is still needed Mobility:  bed rest  PT consulted: N/A Last date of multidisciplinary goals of care discussion [8/29] Code Status:   DNR Disposition: Stepdown  Critical care time: 35 minutes     Rosilyn Mings, [place] Pager [phone] (please enter 7 digits) PCCM Consult Pager [phone] (please enter 7 digits)

## 2020-12-12 NOTE — Plan of Care (Signed)
Patient resting comfortably in bed, watching television. Patient remains on 5L nasal canula. Vital signs remain within normal limits.   Problem: Clinical Measurements: Goal: Ability to maintain clinical measurements within normal limits will improve Outcome: Progressing Goal: Will remain free from infection Outcome: Progressing Goal: Diagnostic test results will improve Outcome: Progressing Goal: Respiratory complications will improve Outcome: Progressing Goal: Cardiovascular complication will be avoided Outcome: Progressing

## 2020-12-12 NOTE — Consult Note (Signed)
ANTICOAGULATION CONSULT NOTE  Pharmacy Consult for warfarin Indication: atrial fibrillation   Patient Measurements: Height: 6' (182.9 cm) Weight: 123.5 kg (272 lb 4.3 oz) IBW/kg (Calculated) : 77.6   Vital Signs: Temp: 98.2 F (36.8 C) (08/29 0500) Temp Source: Oral (08/29 0500) BP: 97/57 (08/29 0600) Pulse Rate: 93 (08/29 0600)  Labs: Recent Labs    12/09/20 1711 12/09/20 2206 12/10/20 0427 12/10/20 2009 12/11/20 0636 12/12/20 0609  HGB  --   --   --  9.9* 10.1* 9.3*  HCT  --   --   --  34.4* 33.9* 30.5*  PLT  --   --   --  104* 108* 109*  LABPROT  --   --  35.6*  --  45.0* 38.4*  INR  --   --  3.6*  --  4.8* 3.9*  CREATININE  --   --  1.66* 2.53* 2.74* 2.97*  TROPONINIHS 6 8  --   --   --   --      Estimated Creatinine Clearance: 32.3 mL/min (A) (by C-G formula based on SCr of 2.97 mg/dL (H)).   Medical History: Past Medical History:  Diagnosis Date   A-fib (HCC)    CHF (congestive heart failure) (HCC)    Coronary artery disease    Hypertension    Renal disorder     Medications:  Chronic Warfarin regimen: 4 mg daily (TWD = 28 mg)  Assessment: 70 y.o. male with medical history paroxysmal atrial fibrillation stable on chronic warfarin regimen as above. Presented to the ER with progressive shortness of breath lower extremity swelling, orthopnea and PND over the last week.    INR supratherapeutic (3.6) on admission. No new outpatient medication identified (patient not taking prednisone -- verified with patient/wife) possibly 2/2 fluid overload/congestion?  Date   INR   Warfarin Dose  8/25   --   4 mg (PTA) 8/26   3.6   Hold  8/27   3.6   Hold 8/28    4.8   Hold 8/29   3.9   HOLD  HGB stable since admission, Scr worsening 1.45--->2.97  Goal of Therapy:  INR 2-3 Monitor platelets by anticoagulation protocol: Yes   Plan:  INR remains supratherapeutic Withhold dose again tonight INR daily   Dallie Piles, PharmD, BCPS Clinical Pharmacist    12/12/2020,7:22 AM

## 2020-12-12 NOTE — Progress Notes (Signed)
[place], [place] 12/12/20  Subjective:   Hospital day # 3 Hospital course: Initially he was admitted for treatment of congestive heart failure.  Patient subsequently became lethargic with worsening shortness of breath and leg edema and is transferred to the intensive care unit  8/29-states he was able to eat a good breakfast.  Denies any nausea or vomiting.  Urine output appears to be increasing.  Serum creatinine is also worsening. Overall patient feels improved although he still has large amount of lower extremity edema all the way up to lower abdomen.  Requiring BiPAP at night.  Levophed drip was discontinued at 5 AM this morning. Renal: 08/28 0701 - 08/29 0700 In: 1385.4 [P.O.:240; I.V.:644.2; IV Piggyback:501.3] Out: 1300 [Urine:1300] Lab Results  Component Value Date   CREATININE 2.97 (H) 12/12/2020   CREATININE 2.74 (H) 12/11/2020   CREATININE 2.53 (H) 12/10/2020     Objective:  Vital signs in last 24 hours:  Temp:  [97.4 F (36.3 C)-98.9 F (37.2 C)] 97.4 F (36.3 C) (08/29 0800) Pulse Rate:  [45-154] 116 (08/29 0800) Resp:  [10-29] 27 (08/29 0900) BP: (87-114)/(50-77) 101/56 (08/29 0900) SpO2:  [90 %-97 %] 93 % (08/29 0800) Weight:  [123.5 kg] 123.5 kg (08/29 0500)  Weight change: -0.1 kg Filed Weights   12/10/20 0316 12/11/20 0125 12/12/20 0500  Weight: 127 kg 123.6 kg 123.5 kg    Intake/Output:    Intake/Output Summary (Last 24 hours) at 12/12/2020 0915 Last data filed at 12/12/2020 0800 Gross per 24 hour  Intake 956.97 ml  Output 1150 ml  Net -193.03 ml     Physical Exam: General: No acute distress, laying in the bed  HEENT Anicteric, moist oral mucous membranes  Pulm/lungs Mild bilateral basilar crackles, [place] O2  CVS/Heart No rub  Abdomen:  Distended, obese  Extremities: 2-3+ lower extremity edema all the way up to lower abdomen  Neurologic: Alert, able to follow commands  Skin: Warm, dry          Basic  Metabolic Panel:  Recent Labs  Lab 12/09/20 1445 12/10/20 0427 12/10/20 2009 12/10/20 2258 12/11/20 0636 12/11/20 2005 12/12/20 0609  NA 138 139 139  --  137  --  134*  K 3.7 4.6 5.5* 5.2* 5.3* 5.3* 4.9  CL 103 102 100  --  100  --  98  CO2 30 32 32  --  31  --  32  GLUCOSE 220* 170* 122*  --  214*  --  219*  BUN 14 18 30*  --  42*  --  64*  CREATININE 1.45* 1.66* 2.53*  --  2.74*  --  2.97*  CALCIUM 8.7* 8.4* 8.3*  --  8.5*  --  8.8*  MG  --   --   --   --  2.0  --  2.3  PHOS  --   --   --   --  5.2*  --  4.2     CBC: Recent Labs  Lab 12/09/20 1445 12/10/20 2009 12/11/20 0636 12/12/20 0609  WBC 4.7 10.1 10.8* 7.5  HGB 10.0* 9.9* 10.1* 9.3*  HCT 32.6* 34.4* 33.9* 30.5*  MCV 90.3 95.6 93.4 89.7  PLT 122* 104* 108* 109*     No results found for: HEPBSAG, HEPBSAB, HEPBIGM    Microbiology:  Recent Results (from the past 240 hour(s))  Resp Panel by RT-PCR (Flu A&B, Covid) Nasopharyngeal Swab     Status: None   Collection Time: 12/09/20  5:11  PM   Specimen: Nasopharyngeal Swab; Nasopharyngeal(NP) swabs in vial transport medium  Result Value Ref Range Status   SARS Coronavirus 2 by RT PCR NEGATIVE NEGATIVE Final    Comment: (NOTE) SARS-CoV-2 target nucleic acids are NOT DETECTED.  The SARS-CoV-2 RNA is generally detectable in upper respiratory specimens during the acute phase of infection. The lowest concentration of SARS-CoV-2 viral copies this assay can detect is 138 copies/mL. A negative result does not preclude SARS-Cov-2 infection and should not be used as the sole basis for treatment or other patient management decisions. A negative result may occur with  improper specimen collection/handling, submission of specimen other than nasopharyngeal swab, presence of viral mutation(s) within the areas targeted by this assay, and inadequate number of viral copies(<138 copies/mL). A negative result must be combined with clinical observations, patient history, and  epidemiological information. The expected result is Negative.  Fact Sheet for Patients:  EntrepreneurPulse.com.au  Fact Sheet for Healthcare Providers:  IncredibleEmployment.be  This test is no t yet approved or cleared by the [place] FDA and  has been authorized for detection and/or diagnosis of SARS-CoV-2 by FDA under an Emergency Use Authorization (EUA). This EUA will remain  in effect (meaning this test can be used) for the duration of the COVID-19 declaration under Section 564(b)(1) of the Act, 21 U.S.C.section 360bbb-3(b)(1), unless the authorization is terminated  or revoked sooner.       Influenza A by PCR NEGATIVE NEGATIVE Final   Influenza B by PCR NEGATIVE NEGATIVE Final    Comment: (NOTE) The Xpert Xpress SARS-CoV-2/FLU/RSV plus assay is intended as an aid in the diagnosis of influenza from Nasopharyngeal swab specimens and should not be used as a sole basis for treatment. Nasal washings and aspirates are unacceptable for Xpert Xpress SARS-CoV-2/FLU/RSV testing.  Fact Sheet for Patients: EntrepreneurPulse.com.au  Fact Sheet for Healthcare Providers: IncredibleEmployment.be  This test is not yet approved or cleared by the [place] FDA and has been authorized for detection and/or diagnosis of SARS-CoV-2 by FDA under an Emergency Use Authorization (EUA). This EUA will remain in effect (meaning this test can be used) for the duration of the COVID-19 declaration under Section 564(b)(1) of the Act, 21 U.S.C. section 360bbb-3(b)(1), unless the authorization is terminated or revoked.  Performed at [facility], [address] [place], [place] [address]   Urine Culture     Status: None   Collection Time: 12/10/20  6:14 PM   Specimen: Urine, Random  Result Value Ref Range Status   Specimen Description   Final    URINE, RANDOM Performed at [facility], [address], [place], [place] [address]    Special Requests   Final    NONE Performed at [facility], [address], [place], [place] [address]    Culture   Final    NO GROWTH Performed at [facility], [place] [address], [place], [place] [address]    Report Status 12/12/2020 FINAL  Final  MRSA Next Gen by PCR, Nasal     Status: None   Collection Time: 12/10/20  6:22 PM   Specimen: Nasal Mucosa; Nasal Swab  Result Value Ref Range Status   MRSA by PCR Next Gen NOT DETECTED NOT DETECTED Final    Comment: (NOTE) The GeneXpert MRSA Assay (FDA approved for NASAL specimens only), is one component of a comprehensive MRSA colonization surveillance program. It is not intended to diagnose MRSA infection nor to guide or monitor treatment for MRSA  infections. Test performance is not FDA approved in patients less than 15 years old. Performed at [facility], [address] [place], [place] [address]     Coagulation Studies: Recent Labs    12/09/20 1445 12/10/20 0427 12/11/20 0636 12/12/20 0609  LABPROT 35.7* 35.6* 45.0* 38.4*  INR 3.6* 3.6* 4.8* 3.9*    Urinalysis: Recent Labs    12/10/20 1814  COLORURINE AMBER*  LABSPEC 1.013  PHURINE 5.0  GLUCOSEU NEGATIVE  HGBUR NEGATIVE  BILIRUBINUR NEGATIVE  KETONESUR NEGATIVE  PROTEINUR 30*  NITRITE NEGATIVE  LEUKOCYTESUR NEGATIVE      Imaging: CT HEAD WO CONTRAST (5MM)  Result Date: 12/10/2020 CLINICAL DATA:  Delirium.  Acute mental status change. EXAM: CT HEAD WITHOUT CONTRAST TECHNIQUE: Contiguous axial images were obtained from the base of the skull through the vertex without intravenous contrast. COMPARISON:  None. FINDINGS: The study is mildly motion degraded. Brain: There is no evidence of an acute cortically based infarct, intracranial hemorrhage, mass, midline shift, or extra-axial fluid collection. Hypodensities in the cerebral white matter bilaterally are nonspecific but compatible with mild-to-moderate  chronic small vessel ischemic disease. Lacunar infarcts in the left greater than right basal ganglia are chronic in appearance. There are age indeterminate lacunar infarcts in the left corona radiata and left thalamus. The ventricles and sulci are within normal limits for age. Vascular: Calcified atherosclerosis at the skull base. No hyperdense vessel. Skull: No fracture or suspicious osseous lesion. Sinuses/Orbits: Large mucous retention cyst in the right sphenoid sinus. Clear mastoid air cells. Unremarkable orbits. Other: None. IMPRESSION: 1. No evidence of an acute cortically based infarct or intracranial hemorrhage. 2. Age indeterminate lacunar infarcts in the left corona radiata and left thalamus. 3. Chronic small vessel ischemia with chronic lacunar infarcts in the basal ganglia. Electronically Signed   By: Logan Bores M.D.   On: 12/10/2020 13:26   DG Chest Port 1 View  Result Date: 12/11/2020 CLINICAL DATA:  Worsening leg edema, shortness of breath. History of congestive heart failure. EXAM: PORTABLE CHEST 1 VIEW COMPARISON:  Chest x-rays dated 12/10/2020, 12/09/2020 and 04/27/2008 FINDINGS: Stable cardiomegaly. Prominent interstitial markings are again seen bilaterally. Hazy opacity persists at the RIGHT lung base, likely indicating small RIGHT pleural effusion. No new lung findings. No pneumothorax is seen. IMPRESSION: 1. Stable chest x-ray. Probable small RIGHT pleural effusion. Underlying pneumonia not excluded. 2. Stable cardiomegaly and mild interstitial prominence, likely a chronic mild CHF. Electronically Signed   By: Franki Cabot M.D.   On: 12/11/2020 12:00   DG Chest Port 1 View  Result Date: 12/10/2020 CLINICAL DATA:  The patient is unresponsive. EXAM: PORTABLE CHEST 1 VIEW COMPARISON:  December 09, 2020 FINDINGS: Cardiomegaly is stable. The hila and mediastinum are unremarkable. Layering effusion and underlying opacity in the right base. Increased interstitial markings bilaterally. No  other acute abnormalities. IMPRESSION: 1. Right-sided layering pleural effusion with underlying opacity. 2. Cardiomegaly and mild edema. Electronically Signed   By: Dorise Bullion III M.D.   On: 12/10/2020 18:59     Medications:    sodium chloride     sodium chloride Stopped (12/11/20 1100)   furosemide (LASIX) 200 mg in dextrose 5% 100 mL ('2mg'$ /mL) infusion 5 mg/hr (12/12/20 0800)   norepinephrine (LEVOPHED) Adult infusion Stopped (12/12/20 0518)    budesonide (PULMICORT) nebulizer solution  0.5 mg Nebulization BID   calcitRIOL  0.25 mcg Oral Once per day on Mon Wed Fri   Chlorhexidine Gluconate Cloth  6 each Topical Daily   insulin  aspart  0-15 Units Subcutaneous TID WC   insulin aspart  0-5 Units Subcutaneous QHS   levofloxacin  750 mg Oral Q48H   methylPREDNISolone (SOLU-MEDROL) injection  40 mg Intravenous Q12H   pravastatin  20 mg Oral q1800   sodium chloride flush  3 mL Intravenous Q12H   Warfarin - Pharmacist Dosing Inpatient   Does not apply q1600   sodium chloride, acetaminophen, levalbuterol, ondansetron (ZOFRAN) IV, sodium chloride flush  Assessment/ Plan:  70 y.o. male with   hypertension, coronary artery disease, congestive heart failure, chronic kidney disease  admitted on 12/09/2020 for Acute on chronic combined systolic and diastolic CHF, NYHA class 3 (HCC) [I50.43] Acute on chronic respiratory failure with hypoxia (HCC) [J96.21] Acute on chronic congestive heart failure, unspecified heart failure type ([place]) [I50.9]  Patient with hypertension, coronary artery disease, CHF, CKD, anemia admitted with worsening shortness of breath with exertion, worsening lower extremity edema and orthopnea  #AKI with CKD stage IIIa Baseline creatinine of 1.45/GFR 52  08/28 0701 - 08/29 0700 In: 1385.4 [P.O.:240; I.V.:644.2; IV Piggyback:501.3] Out: 1300 [Urine:1300]   Lab Results  Component Value Date   CREATININE 2.97 (H) 12/12/2020   CREATININE 2.74 (H) 12/11/2020    CREATININE 2.53 (H) 12/10/2020    AKI likely secondary to abnormal hemodynamics from cardiorenal syndrome.  Urinalysis negative for blood.  Minimal protein. Serum creatinine is worsening Urine output is maintained with IV furosemide infusion Would continue IV furosemide at the current rate to address volume overload Electrolytes are acceptable.  No acute indication for dialysis at present  #Volume overload with lower extremity edema #Acute exacerbation of chronic systolic CHF. Age 86 2022-2D echo-LVEF 48 to 55%, low normal function, mild LVH, indeterminate diastolic parameters, moderately dilated right atrium, right ventricular systolic function low normal, Avoid hypotension.  Added midodrine.    LOS: [place] 8/29/20229:15 AM  [place], [place]  Note: This note was prepared with Dragon dictation. Any transcription errors are unintentional

## 2020-12-12 NOTE — Plan of Care (Signed)
  Problem: Education: Goal: Knowledge of General Education information will improve Description: Including pain rating scale, medication(s)/side effects and non-pharmacologic comfort measures 12/12/2020 0036 by Patrecia Pace, RN Outcome: Progressing 12/12/2020 0036 by Patrecia Pace, RN Outcome: Progressing 12/11/2020 2227 by Patrecia Pace, RN Outcome: Progressing   Problem: Health Behavior/Discharge Planning: Goal: Ability to manage health-related needs will improve 12/12/2020 0036 by Patrecia Pace, RN Outcome: Progressing 12/12/2020 0036 by Patrecia Pace, RN Outcome: Progressing 12/11/2020 2227 by Patrecia Pace, RN Outcome: Progressing   Problem: Clinical Measurements: Goal: Ability to maintain clinical measurements within normal limits will improve 12/12/2020 0036 by Patrecia Pace, RN Outcome: Progressing 12/12/2020 0036 by Patrecia Pace, RN Outcome: Progressing 12/11/2020 2227 by Patrecia Pace, RN Outcome: Progressing Goal: Will remain free from infection 12/12/2020 0036 by Patrecia Pace, RN Outcome: Progressing 12/12/2020 0036 by Patrecia Pace, RN Outcome: Progressing 12/11/2020 2227 by Patrecia Pace, RN Outcome: Progressing Goal: Diagnostic test results will improve 12/12/2020 0036 by Patrecia Pace, RN Outcome: Progressing 12/12/2020 0036 by Patrecia Pace, RN Outcome: Progressing 12/11/2020 2227 by Patrecia Pace, RN Outcome: Progressing Goal: Respiratory complications will improve 12/12/2020 0036 by Patrecia Pace, RN Outcome: Progressing 12/12/2020 0036 by Patrecia Pace, RN Outcome: Progressing 12/11/2020 2227 by Patrecia Pace, RN Outcome: Progressing Goal: Cardiovascular complication will be avoided 12/12/2020 0036 by Patrecia Pace, RN Outcome: Progressing 12/12/2020 0036 by Patrecia Pace, RN Outcome: Progressing 12/11/2020 2227 by Patrecia Pace, RN Outcome: Progressing   Problem:  Activity: Goal: Risk for activity intolerance will decrease 12/12/2020 0036 by Patrecia Pace, RN Outcome: Progressing 12/12/2020 0036 by Patrecia Pace, RN Outcome: Progressing 12/11/2020 2227 by Patrecia Pace, RN Outcome: Progressing   Problem: Nutrition: Goal: Adequate nutrition will be maintained 12/12/2020 0036 by Patrecia Pace, RN Outcome: Progressing 12/12/2020 0036 by Patrecia Pace, RN Outcome: Progressing 12/11/2020 2227 by Patrecia Pace, RN Outcome: Progressing   Problem: Coping: Goal: Level of anxiety will decrease 12/12/2020 0036 by Patrecia Pace, RN Outcome: Progressing 12/12/2020 0036 by Patrecia Pace, RN Outcome: Progressing 12/11/2020 2227 by Patrecia Pace, RN Outcome: Progressing   Problem: Elimination: Goal: Will not experience complications related to bowel motility 12/12/2020 0036 by Patrecia Pace, RN Outcome: Progressing 12/12/2020 0036 by Patrecia Pace, RN Outcome: Progressing 12/11/2020 2227 by Patrecia Pace, RN Outcome: Progressing Goal: Will not experience complications related to urinary retention 12/12/2020 0036 by Patrecia Pace, RN Outcome: Progressing 12/12/2020 0036 by Patrecia Pace, RN Outcome: Progressing 12/11/2020 2227 by Patrecia Pace, RN Outcome: Progressing   Problem: Pain Managment: Goal: General experience of comfort will improve 12/12/2020 0036 by Patrecia Pace, RN Outcome: Progressing 12/12/2020 0036 by Patrecia Pace, RN Outcome: Progressing 12/11/2020 2227 by Patrecia Pace, RN Outcome: Progressing   Problem: Safety: Goal: Ability to remain free from injury will improve 12/12/2020 0036 by Patrecia Pace, RN Outcome: Progressing 12/12/2020 0036 by Patrecia Pace, RN Outcome: Progressing 12/11/2020 2227 by Patrecia Pace, RN Outcome: Progressing   Problem: Skin Integrity: Goal: Risk for impaired skin integrity will decrease 12/12/2020 0036 by Patrecia Pace, RN Outcome: Progressing 12/11/2020 2227 by Patrecia Pace, RN Outcome: Progressing

## 2020-12-12 NOTE — Progress Notes (Signed)
[facility] [place] NAME: Timothy Chavez    MR#:  [id number]  DATE OF BIRTH:  1951/01/02  SUBJECTIVE:   patient much awake and alert. Currently using BiPAP. Able to communicate well. No family at bedside. Urine output poor. REVIEW OF SYSTEMS:   Review of Systems  Constitutional:  Negative for chills, fever and weight loss.  HENT:  Negative for ear discharge, ear pain and nosebleeds.   Eyes:  Negative for blurred vision, pain and discharge.  Respiratory:  Positive for shortness of breath. Negative for sputum production, wheezing and stridor.   Cardiovascular:  Negative for chest pain, palpitations, orthopnea and PND.  Gastrointestinal:  Negative for abdominal pain, diarrhea, nausea and vomiting.  Genitourinary:  Negative for frequency and urgency.  Musculoskeletal:  Negative for back pain and joint pain.  Neurological:  Negative for sensory change, speech change, focal weakness and weakness.  Psychiatric/Behavioral:  Negative for depression and hallucinations. The patient is not nervous/anxious.   Tolerating Diet:yes Tolerating PT:   DRUG ALLERGIES:   Allergies  Allergen Reactions   Penicillin V     Other reaction(s): Unknown Other reaction(s): Unknown As a child    Penicillins     VITALS:  Blood pressure 107/66, pulse (!) 116, temperature (!) 97.4 F (36.3 C), temperature source Axillary, resp. rate (!) 27, height 6' (1.829 m), weight 123.5 kg, SpO2 93 %.  PHYSICAL EXAMINATION:   Physical Exam  GENERAL:  70 y.o.-year-old patient lying in the bed with no acute distress.  LUNGS: decreased breath sounds bilaterally, no wheezing, rales, rhonchi. No use of accessory muscles of respiration.  CARDIOVASCULAR: S1, S2 normal. No murmurs, rubs, or gallops.  ABDOMEN: Soft, nontender, nondistended. Bowel sounds present. No organomegaly or mass. FOLEY+ EXTREMITIES: No cyanosis, clubbing or edema b/l.    NEUROLOGIC: non focal PSYCHIATRIC:   patient is alert and oriented x 2.  SKIN: No obvious rash, lesion, or ulcer.   LABORATORY PANEL:  CBC Recent Labs  Lab 12/12/20 0609  WBC 7.5  HGB 9.3*  HCT 30.5*  PLT 109*     Chemistries  Recent Labs  Lab 12/12/20 0609  NA 134*  K 4.9  CL 98  CO2 32  GLUCOSE 219*  BUN 64*  CREATININE 2.97*  CALCIUM 8.8*  MG 2.3  AST 15  ALT 10  ALKPHOS 55  BILITOT 0.7    Cardiac Enzymes No results for input(s): TROPONINI in the last 168 hours. RADIOLOGY:  DG Chest Port 1 View  Result Date: 12/11/2020 CLINICAL DATA:  Worsening leg edema, shortness of breath. History of congestive heart failure. EXAM: PORTABLE CHEST 1 VIEW COMPARISON:  Chest x-rays dated 12/10/2020, 12/09/2020 and 04/27/2008 FINDINGS: Stable cardiomegaly. Prominent interstitial markings are again seen bilaterally. Hazy opacity persists at the RIGHT lung base, likely indicating small RIGHT pleural effusion. No new lung findings. No pneumothorax is seen. IMPRESSION: 1. Stable chest x-ray. Probable small RIGHT pleural effusion. Underlying pneumonia not excluded. 2. Stable cardiomegaly and mild interstitial prominence, likely a chronic mild CHF. Electronically Signed   By: Franki Cabot M.D.   On: 12/11/2020 12:00   DG Chest Port 1 View  Result Date: 12/10/2020 CLINICAL DATA:  The patient is unresponsive. EXAM: PORTABLE CHEST 1 VIEW COMPARISON:  December 09, 2020 FINDINGS: Cardiomegaly is stable. The hila and mediastinum are unremarkable. Layering effusion and underlying opacity in the right base. Increased interstitial markings bilaterally. No other acute abnormalities. IMPRESSION: 1. Right-sided layering pleural effusion with underlying  opacity. 2. Cardiomegaly and mild edema. Electronically Signed   By: Dorise Bullion III M.D.   On: 12/10/2020 18:59   ASSESSMENT AND PLAN:   Timothy Chavez is a 70 y.o. male with medical history significant of systolic dysfunction CHF with EF of 45% from echocardiogram on May 10, 2020,  paroxysmal atrial fibrillation, coronary artery disease, chronic kidney disease stage III, essential hypertension, GERD, and diabetes type 2 who presented to the ER with progressive shortness of breath lower extremity swelling, orthopnea and PND over the last week.    Acute on chronic systolic dysfunction CHF/cardiogenic shock/?cardiorenal syndrome acute on chronic COPD exacerbation with hypoxia and hypercapnia/Right Pleural effusion Acute metabolic encephalopathy -- IV Lasix 40 mg BID, monitor input output daily weight  -- PRN inhalers/nebulizer  -- patient follows with [facility] cardiology -- follow-up echocardiogram  -- CT head negative for acute stroke. Positive for chronic small vessel disease and lacunar infarct chronic -- patient much alert awake after using BiPAP --8/28-- patient started on IV Lasix drip per nephrology recommendation. Poor urine output -- empirically on Levaquin (procal 5.3 ) wbc 10K -- holding Coreg and ACE inhibitor -- patient on IV norepinephrine -- repeat chest x-ray shows small Pleural effusion. Hold Thoracentesis --8/29--remains on IV lasix gtt. Creat 2.9    coronary artery disease: -- stable. Troponin normal     diabetes type II with chronic kidney disease stage IIIa -- initiate sliding scale insulin.   Acute on chronic kidney disease stage IIIa -- baseline creatinine 1.5-- 1.8 -- came in with creatinine of 1.4----1.6--2.5--2.74--2.9--good uop -- patient seen by nephrology. Poor urine output. Recommend  CRRT if no improvement   H/o Essential hypertension Hypotension --Blood pressure appears relatively low -- Continue carvedilol    Hyperlipidemia --Continue pravastatin.   Paroxysmal atrial fibrillation: Rate is controlled.   --Continue carvedilol and warfarin   history of COPD with Ex- tobacco Suspected sleep apnea -- PRN inhalers/nebulizer -- patient will benefit from outpatient sleep study   paroxysmal atrial fibrillation -- on Coreg and PO  warfarin. INR therapeutic     DVT prophylaxis: Warfarin Code Status: DNR per orders Family Communication: sister at bedside Disposition Plan: Home Consults called: None              TOTAL TIME TAKING CARE OF THIS PATIENT: 35 minutes.  >50% time spent on counselling and coordination of care  Note: This dictation was prepared with Dragon dictation along with smaller phrase technology. Any transcriptional errors that result from this process are unintentional.  Fritzi Mandes M.D    [facility]   CC: Primary care physician; Idelle Crouch, MD Patient ID: [name], male   DOB: July 08, 1950, 70 y.o.   MRN: [id number]

## 2020-12-12 NOTE — Evaluation (Signed)
Physical Therapy Evaluation Patient Details Name: Timothy Chavez MRN: [id number] DOB: [birth year] Today's Date: 12/12/2020   History of Present Illness  Patient is a 70 y.o male admitted with 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 failure secondary to acute systolic CHF exacerbation, right sided pleural effusion, and possible pneumonia. Past medical history of CHF, CAD, CKD, HTN, GERD, diabetes  Clinical Impression  Patient agreeable to PT. He reports he lives alone and was independent with mobility prior to admission.  Patient needs assistance with bed mobility and minimal assistance for sit to stand transfers. Minimal assistance also provided with taking several steps using rolling walker. Respiration rate increased without significant drop in Sp02 which remained 90% or higher with activity on 5 L02. Patient feels he will be able to manage at home and wishes to be discharged back to his home. Anticipate patient will be able to go home with continued PT to maximize independence and facilitate return to prior level of function.     Follow Up Recommendations Home health PT;Supervision - Intermittent    Equipment Recommendations  None recommended by PT    Recommendations for Other Services       Precautions / Restrictions Precautions Precautions: Fall Restrictions Weight Bearing Restrictions: No      Mobility  Bed Mobility Overal bed mobility: Needs Assistance Bed Mobility: Sit to Supine;Supine to Sit     Supine to sit: Min assist Sit to supine: Mod assist   General bed mobility comments: verbal cues for technique and hand placement.    Transfers Overall transfer level: Needs assistance Equipment used: Rolling walker (2 wheeled) Transfers: Sit to/from Stand Sit to Stand: Min assist;+2 safety/equipment         General transfer comment: verbal cues for hand placement for safety  Ambulation/Gait Ambulation/Gait assistance: Min assist Gait Distance (Feet): 3  Feet Assistive device: Rolling walker (2 wheeled)   Gait velocity: decreased   General Gait Details: patient able to take several side steps along edge of bed. steadying assistance provided and intermittent assistance for rolling walker negotiation. unable to walk further due to ICU lines  Stairs            Wheelchair Mobility    Modified Rankin (Stroke Patients Only)       Balance Overall balance assessment: Needs assistance Sitting-balance support: No upper extremity supported;Feet supported Sitting balance-Leahy Scale: Fair Sitting balance - Comments: no loss of balance in sitting. close stand by assistance for safety for static sitting   Standing balance support: Single extremity supported;During functional activity Standing balance-Leahy Scale: Poor Standing balance comment: requires MIN A for perihygiene standing - assist for balance.                             Pertinent Vitals/Pain Pain Assessment: No/denies pain    Home Living Family/patient expects to be discharged to:: Private residence Living Arrangements: Alone Available Help at Discharge: Family;Available PRN/intermittently Type of Home: House Home Access: Ramped entrance     Home Layout: One level Home Equipment: Walker - 2 wheels      Prior Function Level of Independence: Independent         Comments: Pt reports no O2 baseline, drives, gets groceries     Hand Dominance   Dominant Hand: Right    Extremity/Trunk Assessment   Upper Extremity Assessment Upper Extremity Assessment: Overall WFL for tasks assessed    Lower Extremity Assessment Lower Extremity Assessment: Generalized weakness  Communication   Communication: No difficulties  Cognition Arousal/Alertness: Awake/alert Behavior During Therapy: Flat affect;WFL for tasks assessed/performed Overall Cognitive Status: No family/caregiver present to determine baseline cognitive functioning                                  General Comments: poor insight into deficits and decreased safety awareness and      General Comments General comments (skin integrity, edema, etc.): Sp)2 90s or higher on 5L [place]    Exercises Other Exercises Other Exercises: Pt educated re: OT role, DME recs, d/c recs, falls prevention, ECS Other Exercises: LBD, grooming, toileting, sup<>sit, sit<>stand, sitting/standing balance/tolerance   Assessment/Plan    PT Assessment Patient needs continued PT services  PT Problem List Decreased strength;Decreased activity tolerance;Decreased balance;Decreased mobility;Decreased safety awareness;Decreased knowledge of use of DME       PT Treatment Interventions DME instruction;Stair training;Gait training;Functional mobility training;Therapeutic activities;Therapeutic exercise;Balance training;Neuromuscular re-education;Cognitive remediation;Patient/family education    PT Goals (Current goals can be found in the Care Plan section)  Acute Rehab PT Goals Patient Stated Goal: to go home PT Goal Formulation: With patient Time For Goal Achievement: 12/26/20 Potential to Achieve Goals: Good    Frequency Min 2X/week   Barriers to discharge        Co-evaluation PT/OT/SLP Co-Evaluation/Treatment: Yes Reason for Co-Treatment: Complexity of the patient's impairments (multi-system involvement) PT goals addressed during session: Mobility/safety with mobility OT goals addressed during session: ADL's and self-care       AM-PAC PT "6 Clicks" Mobility  Outcome Measure Help needed turning from your back to your side while in a flat bed without using bedrails?: A Little Help needed moving from lying on your back to sitting on the side of a flat bed without using bedrails?: A Little Help needed moving to and from a bed to a chair (including a wheelchair)?: A Little Help needed standing up from a chair using your arms (e.g., wheelchair or bedside chair)?: A Little Help needed  to walk in hospital room?: A Little Help needed climbing 3-5 steps with a railing? : A Lot 6 Click Score: 17    End of Session Equipment Utilized During Treatment: Oxygen Activity Tolerance: Patient tolerated treatment well Patient left: in bed;with call bell/phone within reach Nurse Communication: Mobility status PT Visit Diagnosis: Unsteadiness on feet (R26.81);Muscle weakness (generalized) (M62.81)    Time: [id number] PT Time Calculation (min) (ACUTE ONLY): 22 min   Charges:   PT Evaluation $PT Eval Moderate Complexity: 1 Mod PT Treatments $Therapeutic Activity: 8-22 mins        Minna Merritts, PT, MPT   Percell Locus 12/12/2020, 3:41 PM

## 2020-12-13 LAB — BASIC METABOLIC PANEL
Anion gap: 8 (ref 5–15)
BUN: 71 mg/dL — ABNORMAL HIGH (ref 8–23)
CO2: 33 mmol/L — ABNORMAL HIGH (ref 22–32)
Calcium: 9 mg/dL (ref 8.9–10.3)
Chloride: 97 mmol/L — ABNORMAL LOW (ref 98–111)
Creatinine, Ser: 2.48 mg/dL — ABNORMAL HIGH (ref 0.61–1.24)
GFR, Estimated: 28 mL/min — ABNORMAL LOW (ref >60)
Glucose, Bld: 208 mg/dL — ABNORMAL HIGH (ref 70–99)
Potassium: 4.2 mmol/L (ref 3.5–5.1)
Sodium: 138 mmol/L (ref 135–145)

## 2020-12-13 LAB — CBC WITH DIFFERENTIAL/PLATELET
Abs Immature Granulocytes: 0.02 10*3/uL (ref 0.00–0.07)
Basophils Absolute: 0 10*3/uL (ref 0.0–0.1)
Basophils Relative: 0 %
Eosinophils Absolute: 0 10*3/uL (ref 0.0–0.5)
Eosinophils Relative: 0 %
HCT: 30.9 % — ABNORMAL LOW (ref 39.0–52.0)
Hemoglobin: 9.7 g/dL — ABNORMAL LOW (ref 13.0–17.0)
Immature Granulocytes: 0 %
Lymphocytes Relative: 2 %
Lymphs Abs: 0.2 10*3/uL — ABNORMAL LOW (ref 0.7–4.0)
MCH: 28 pg (ref 26.0–34.0)
MCHC: 31.4 g/dL (ref 30.0–36.0)
MCV: 89.3 fL (ref 80.0–100.0)
Monocytes Absolute: 0.2 10*3/uL (ref 0.1–1.0)
Monocytes Relative: 3 %
Neutro Abs: 6.9 10*3/uL (ref 1.7–7.7)
Neutrophils Relative %: 95 %
Platelets: 108 10*3/uL — ABNORMAL LOW (ref 150–400)
RBC: 3.46 MIL/uL — ABNORMAL LOW (ref 4.22–5.81)
RDW: 15.3 % (ref 11.5–15.5)
WBC: 7.3 10*3/uL (ref 4.0–10.5)
nRBC: 0 % (ref 0.0–0.2)

## 2020-12-13 LAB — GLUCOSE, CAPILLARY
Glucose-Capillary: 197 mg/dL — ABNORMAL HIGH (ref 70–99)
Glucose-Capillary: 293 mg/dL — ABNORMAL HIGH (ref 70–99)
Glucose-Capillary: 164 mg/dL — ABNORMAL HIGH (ref 70–99)
Glucose-Capillary: 264 mg/dL — ABNORMAL HIGH (ref 70–99)

## 2020-12-13 LAB — MAGNESIUM: Magnesium: 2.1 mg/dL (ref 1.7–2.4)

## 2020-12-13 LAB — PROTIME-INR
INR: 2.4 — ABNORMAL HIGH (ref 0.8–1.2)
Prothrombin Time: 26.4 seconds — ABNORMAL HIGH (ref 11.4–15.2)

## 2020-12-13 MED ORDER — TRAZODONE HCL 50 MG PO TABS
50.0000 mg | ORAL_TABLET | Freq: Every day | ORAL | Status: DC
  Administered 2020-12-13 – 2020-12-15 (×3): 50 mg via ORAL
  Filled 2020-12-13 (×3): qty 1

## 2020-12-13 MED ORDER — WARFARIN SODIUM 2 MG PO TABS
2.0000 mg | ORAL_TABLET | Freq: Once | ORAL | Status: AC
  Administered 2020-12-13: 2 mg via ORAL
  Filled 2020-12-13: qty 1

## 2020-12-13 NOTE — Progress Notes (Signed)
[facility] NAME: Timothy Chavez    MR#:  [id number]  DATE OF BIRTH:  [birth year]  SUBJECTIVE:   patient much awake and alert.  Sitting up in the bed. Patient is insisting he wants to go home as soon as his sister comes REVIEW OF SYSTEMS:   Review of Systems  Constitutional:  Negative for chills, fever and weight loss.  HENT:  Negative for ear discharge, ear pain and nosebleeds.   Eyes:  Negative for blurred vision, pain and discharge.  Respiratory:  Positive for shortness of breath. Negative for sputum production, wheezing and stridor.   Cardiovascular:  Negative for chest pain, palpitations, orthopnea and PND.  Gastrointestinal:  Negative for abdominal pain, diarrhea, nausea and vomiting.  Genitourinary:  Negative for frequency and urgency.  Musculoskeletal:  Negative for back pain and joint pain.  Neurological:  Negative for sensory change, speech change, focal weakness and weakness.  Psychiatric/Behavioral:  Negative for depression and hallucinations. The patient is not nervous/anxious.   Tolerating Diet:yes Tolerating PT: HHPT  DRUG ALLERGIES:   Allergies  Allergen Reactions   Penicillin V     Other reaction(s): Unknown Other reaction(s): Unknown As a child    Penicillins     VITALS:  Blood pressure 128/74, pulse (!) 103, temperature 97.7 F (36.5 C), temperature source Oral, resp. rate 18, height 6' (1.829 m), weight 125.5 kg, SpO2 92 %.  PHYSICAL EXAMINATION:   Physical Exam  GENERAL:  70 y.o.-year-old patient lying in the bed with no acute distress.  LUNGS: decreased breath sounds bilaterally, no wheezing, rales, rhonchi. No use of accessory muscles of respiration.  CARDIOVASCULAR: S1, S2 normal. No murmurs, rubs, or gallops.  ABDOMEN: Soft, nontender, nondistended. Bowel sounds present. No organomegaly or mass. FOLEY+ EXTREMITIES: No cyanosis, clubbing  +++edema b/l.    NEUROLOGIC: non focal PSYCHIATRIC:   patient is alert and oriented x 2.  SKIN: No obvious rash, lesion, or ulcer.   LABORATORY PANEL:  CBC Recent Labs  Lab 12/13/20 0554  WBC 7.3  HGB 9.7*  HCT 30.9*  PLT 108*     Chemistries  Recent Labs  Lab 12/12/20 0609 12/13/20 0554  NA 134* 138  K 4.9 4.2  CL 98 97*  CO2 32 33*  GLUCOSE 219* 208*  BUN 64* 71*  CREATININE 2.97* 2.48*  CALCIUM 8.8* 9.0  MG 2.3 2.1  AST 15  --   ALT 10  --   ALKPHOS 55  --   BILITOT 0.7  --     Cardiac Enzymes No results for input(s): TROPONINI in the last 168 hours. RADIOLOGY:  No results found. ASSESSMENT AND PLAN:   Timothy Chavez is a 70 y.o. male with medical history significant of systolic dysfunction CHF with EF of 45% from echocardiogram on May 10, 2020, paroxysmal atrial fibrillation, coronary artery disease, chronic kidney disease stage III, essential hypertension, GERD, and diabetes type 2 who presented to the ER with progressive shortness of breath lower extremity swelling, orthopnea and PND over the last week.    Acute on chronic systolic dysfunction CHF/cardiogenic shock/?cardiorenal syndrome acute on chronic COPD exacerbation with hypoxia and hypercapnia/Right Pleural effusion Acute metabolic encephalopathy -- IV Lasix 40 mg BID, monitor input output daily weight  -- PRN inhalers/nebulizer  -- patient follows with [facility] cardiology -- follow-up echocardiogram  -- CT head negative for acute stroke. Positive for chronic small vessel disease and lacunar infarct chronic -- patient much alert awake  after using BiPAP --8/28-- patient started on IV Lasix drip per nephrology recommendation. Poor urine output -- empirically on Levaquin (procal 5.3 ) wbc 10K  -- holding Coreg and ACE inhibitor -- patient was on IV norepinephrine -- repeat chest x-ray shows small Pleural effusion.Hold Thoracentesis --8/29--remains on IV lasix gtt. Creat 2.9 --8/30--insisting to go home Diuresing well with lasix gtt. Creat 2.4     coronary artery disease: -- stable. Troponin normal     diabetes type II with chronic kidney disease stage IIIa -- initiate sliding scale insulin.   Acute on chronic kidney disease stage IIIa -- baseline creatinine 1.5-- 1.8 -- came in with creatinine of 1.4----1.6--2.5--2.74--2.9--good uop--2.4 -- patient seen by nephrology-- to remain on Lasix drip significant amount of fluid overload  H/o Essential hypertension Hypotension --Blood pressure appears relatively low -- Continue carvedilol    Hyperlipidemia --Continue pravastatin.   Paroxysmal atrial fibrillation: Rate is controlled.   --Continue carvedilol and warfarin   history of COPD with Ex- tobacco Suspected sleep apnea -- PRN inhalers/nebulizer -- patient will benefit from outpatient sleep study   paroxysmal atrial fibrillation -- on Coreg and PO warfarin. INR therapeutic     DVT prophylaxis: Warfarin Code Status: DNR per orders Family Communication: sister Timothy Chavez Disposition Plan: Home Consults called: None      transfer out of ICU        TOTAL TIME TAKING CARE OF THIS PATIENT: 25 minutes.  >50% time spent on counselling and coordination of care  Note: This dictation was prepared with Dragon dictation along with smaller phrase technology. Any transcriptional errors that result from this process are unintentional.  Timothy Chavez M.D    [facility]   CC: Primary care physician; Timothy Crouch, MD Patient ID: Timothy Chavez, male   DOB: [birth year], 70 y.o.   MRN: [id number]

## 2020-12-13 NOTE — Progress Notes (Signed)
Inpatient Diabetes Program Recommendations  AACE/ADA: New Consensus Statement on Inpatient Glycemic Control   Target Ranges:  Prepandial:   less than 140 mg/dL      Peak postprandial:   less than 180 mg/dL (1-2 hours)      Critically ill patients:  140 - 180 mg/dL   Results for ADIAN, WOODHULL (MRN [id number]) as of 12/13/2020 09:44  Ref. Range 12/12/2020 08:06 12/12/2020 11:47 12/12/2020 16:03 12/12/2020 22:10 12/13/2020 07:24  Glucose-Capillary Latest Ref Range: 70 - 99 mg/dL 205 (H) 184 (H) 186 (H) 174 (H) 197 (H)    Review of Glycemic Control  Diabetes history: DM2 Outpatient Diabetes medications: None Current orders for Inpatient glycemic control: Novolog 0-15 units TID with meals, Novolog 0-5 units QHS; Solumedrol 40 mg Q12H  Inpatient Diabetes Program Recommendations:    Insulin: If steroids are continued, please consider ordering Novolog 3 units TID with meals for meal coverage if patient eats at least 50% of meals.  Thanks, Barnie Alderman, RN, MSN, CDE Diabetes Coordinator Inpatient Diabetes Program [phone] (Team Pager from 8am to 5pm)

## 2020-12-13 NOTE — Consult Note (Signed)
[place] for warfarin Indication: atrial fibrillation   Patient Measurements: Height: 6' (182.9 cm) Weight: 125.5 kg (276 lb 10.8 oz) IBW/kg (Calculated) : 77.6   Vital Signs: Temp: 98.2 F (36.8 C) (08/29 2000) BP: 116/69 (08/30 0500) Pulse Rate: 107 (08/30 0500)  Labs: Recent Labs    12/11/20 0636 12/12/20 0609 12/13/20 0554  HGB 10.1* 9.3* 9.7*  HCT 33.9* 30.5* 30.9*  PLT 108* 109* 108*  LABPROT 45.0* 38.4* 26.4*  INR 4.8* 3.9* 2.4*  CREATININE 2.74* 2.97* 2.48*     Estimated Creatinine Clearance: 39 mL/min (A) (by C-G formula based on SCr of 2.48 mg/dL (H)).   Medical History: Past Medical History:  Diagnosis Date   A-fib (HCC)    CHF (congestive heart failure) (HCC)    Coronary artery disease    Hypertension    Renal disorder     Medications:  Chronic Warfarin regimen: 4 mg daily (TWD = 28 mg)  Assessment: 70 y.o. male with medical history paroxysmal atrial fibrillation stable on chronic warfarin regimen as above. Presented to the ER with progressive shortness of breath lower extremity swelling, orthopnea and PND over the last week.    INR supratherapeutic (3.6) on admission. No new outpatient medication identified (patient not taking prednisone -- verified with patient/wife  Date   INR   Warfarin Dose  8/25   --   4 mg (PTA) 8/26   3.6   Hold  8/27   3.6   Hold 8/28    4.8   Hold 8/29   3.9   HOLD 8/30   2.4   2 mg  HGB stable since admission, Scr worsening 1.45--->2.97, slightly improved overnight  DDIs: levofloxacin  Goal of Therapy:  INR 2-3 Monitor platelets by anticoagulation protocol: Yes   Plan:  INR is now therapeutic Restart warfarin at 1/2 home dose due to DDI INR daily   Dallie Piles, PharmD, BCPS Clinical Pharmacist   12/13/2020,7:40 AM

## 2020-12-13 NOTE — Progress Notes (Signed)
Physical Therapy Treatment Patient Details Name: Timothy Chavez MRN: [id number] DOB: 1950-08-01 Today's Date: 12/13/2020    History of Present Illness Patient is a 70 y.o male admitted with 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 failure secondary to acute systolic CHF exacerbation, right sided pleural effusion, and possible pneumonia. Past medical history of CHF, CAD, CKD, HTN, GERD, diabetes    PT Comments    Patient agreeable to PT. He had improvement in functional independence and overall activity tolerance this session. Patient ambulated in hallway 25f total with rolling walker with Min guard initially progressing to supervision. Sp02 88% briefly (less than 15 seconds) with walking and quickly increased to mid 90's with short rest break while walking. Patient educated on energy conservation technique, pacing for endurance, and using rolling walker for support with ambulation for fall prevention. Patient is adamant about discharge home. He is agreeable to HHPT. PT will continue to follow to maximize independence and facilitate return to prior level of function.     Follow Up Recommendations  Home health PT;Supervision - Intermittent     Equipment Recommendations  None recommended by PT (patient has RW, shower chair, and ramp in place at home)    Recommendations for Other Services       Precautions / Restrictions Precautions Precautions: Fall Restrictions Weight Bearing Restrictions: No    Mobility  Bed Mobility Overal bed mobility: Needs Assistance Bed Mobility: Supine to Sit       Sit to supine: Supervision   General bed mobility comments: verbal cues for technique. increased time required to complete tasks    Transfers Overall transfer level: Needs assistance Equipment used: Rolling walker (2 wheeled) Transfers: Sit to/from Stand Sit to Stand: Min guard         General transfer comment: Min guard for safety. verbal cues for technique and hand  placement  Ambulation/Gait Ambulation/Gait assistance: Min guard;Supervision Gait Distance (Feet): 85 Feet Assistive device: Rolling walker (2 wheeled) Gait Pattern/deviations: Step-through pattern;Trunk flexed Gait velocity: decreased   General Gait Details: verbal cues for technique using rolling walker, pacing for endurance. patient ambulated on room air with Sp02 88-96%. educated patient on using rolling walker for support for safety and fall prevention at home. Min guard initially progressing to supervision with increased ambulation distance   Stairs             Wheelchair Mobility    Modified Rankin (Stroke Patients Only)       Balance Overall balance assessment: Needs assistance Sitting-balance support: No upper extremity supported;Feet supported Sitting balance-Leahy Scale: Fair     Standing balance support: During functional activity;Bilateral upper extremity supported Standing balance-Leahy Scale: Fair Standing balance comment: patient relying on rolling walker for support in standing                            Cognition Arousal/Alertness: Awake/alert Behavior During Therapy: WFL for tasks assessed/performed                                   General Comments: patient able to follow single step commands with increased time. poor insight into physical limitations and deficits      Exercises      General Comments        Pertinent Vitals/Pain Pain Assessment: No/denies pain    Home Living  Prior Function            PT Goals (current goals can now be found in the care plan section) Acute Rehab PT Goals Patient Stated Goal: to go home PT Goal Formulation: With patient Time For Goal Achievement: 12/26/20 Potential to Achieve Goals: Good Progress towards PT goals: Progressing toward goals    Frequency    Min 2X/week      PT Plan Current plan remains appropriate    Co-evaluation               AM-PAC PT "6 Clicks" Mobility   Outcome Measure  Help needed turning from your back to your side while in a flat bed without using bedrails?: A Little Help needed moving from lying on your back to sitting on the side of a flat bed without using bedrails?: A Little Help needed moving to and from a bed to a chair (including a wheelchair)?: A Little Help needed standing up from a chair using your arms (e.g., wheelchair or bedside chair)?: A Little Help needed to walk in hospital room?: A Little Help needed climbing 3-5 steps with a railing? : A Lot 6 Click Score: 17    End of Session Equipment Utilized During Treatment: Gait belt Activity Tolerance: Patient tolerated treatment well Patient left: in chair;with call bell/phone within reach Nurse Communication: Mobility status PT Visit Diagnosis: Unsteadiness on feet (R26.81);Muscle weakness (generalized) (M62.81)     Time: [id number] PT Time Calculation (min) (ACUTE ONLY): 42 min  Charges:  $Gait Training: 8-22 mins $Therapeutic Activity: 23-37 mins                     Minna Merritts, PT, MPT    Percell Locus 12/13/2020, 12:50 PM

## 2020-12-13 NOTE — Progress Notes (Signed)
[place], [place] 12/13/20  Subjective:   Hospital day # 4 Hospital course: Initially he was admitted for treatment of congestive heart failure.  Patient subsequently became lethargic with worsening shortness of breath and leg edema and is transferred to the intensive care unit  8/29-states he was able to eat a good breakfast.  Denies any nausea or vomiting.  Urine output appears to be increasing.  Serum creatinine is also worsening. Overall patient feels improved although he still has large amount of lower extremity edema all the way up to lower abdomen.  Requiring BiPAP at night.  Levophed drip was discontinued at 5 AM this morning.  8/30-continued on IV furosemide.  Improvement in serum creatinine is noted.  Urine output of over 3600 cc in the last 24 hours Patient is insisting on going home today.   Renal: 08/29 0701 - 08/30 0700 In: 456.3 [P.O.:400; I.V.:56.3] Out: 3650 [Urine:3650] Lab Results  Component Value Date   CREATININE 2.48 (H) 12/13/2020   CREATININE 2.97 (H) 12/12/2020   CREATININE 2.74 (H) 12/11/2020     Objective:  Vital signs in last 24 hours:  Temp:  [97.2 F (36.2 C)-98.2 F (36.8 C)] 97.2 F (36.2 C) (08/30 0800) Pulse Rate:  [70-146] 111 (08/30 0958) Resp:  [16-32] 17 (08/30 0958) BP: (86-130)/(60-80) 124/78 (08/30 0900) SpO2:  [93 %-100 %] 93 % (08/30 0958) Weight:  [125.5 kg] 125.5 kg (08/30 0500)  Weight change: 2 kg Filed Weights   12/11/20 0125 12/12/20 0500 12/13/20 0500  Weight: 123.6 kg 123.5 kg 125.5 kg    Intake/Output:    Intake/Output Summary (Last 24 hours) at 12/13/2020 1359 Last data filed at 12/13/2020 0900 Gross per 24 hour  Intake 457.84 ml  Output 3850 ml  Net -3392.16 ml      Physical Exam: General: No acute distress, laying in the bed  HEENT Anicteric, moist oral mucous membranes  Pulm/lungs Mild bilateral basilar crackles, [place] O2  CVS/Heart No rub, irregular  Abdomen:  Distended,  obese  Extremities: 2-3+ lower extremity edema   Neurologic: Alert, able to follow commands  Skin: Warm, dry          Basic Metabolic Panel:  Recent Labs  Lab 12/10/20 0427 12/10/20 2009 12/10/20 2258 12/11/20 0636 12/11/20 2005 12/12/20 0609 12/13/20 0554  NA 139 139  --  137  --  134* 138  K 4.6 5.5* 5.2* 5.3* 5.3* 4.9 4.2  CL 102 100  --  100  --  98 97*  CO2 32 32  --  31  --  32 33*  GLUCOSE 170* 122*  --  214*  --  219* 208*  BUN 18 30*  --  42*  --  64* 71*  CREATININE 1.66* 2.53*  --  2.74*  --  2.97* 2.48*  CALCIUM 8.4* 8.3*  --  8.5*  --  8.8* 9.0  MG  --   --   --  2.0  --  2.3 2.1  PHOS  --   --   --  5.2*  --  4.2  --       CBC: Recent Labs  Lab 12/09/20 1445 12/10/20 2009 12/11/20 0636 12/12/20 0609 12/13/20 0554  WBC 4.7 10.1 10.8* 7.5 7.3  NEUTROABS  --   --   --   --  6.9  HGB 10.0* 9.9* 10.1* 9.3* 9.7*  HCT 32.6* 34.4* 33.9* 30.5* 30.9*  MCV 90.3 95.6 93.4 89.7 89.3  PLT 122* 104* 108*  109* 108*      No results found for: HEPBSAG, HEPBSAB, HEPBIGM    Microbiology:  Recent Results (from the past 240 hour(s))  Resp Panel by RT-PCR (Flu A&B, Covid) Nasopharyngeal Swab     Status: None   Collection Time: 12/09/20  5:11 PM   Specimen: Nasopharyngeal Swab; Nasopharyngeal(NP) swabs in vial transport medium  Result Value Ref Range Status   SARS Coronavirus 2 by RT PCR NEGATIVE NEGATIVE Final    Comment: (NOTE) SARS-CoV-2 target nucleic acids are NOT DETECTED.  The SARS-CoV-2 RNA is generally detectable in upper respiratory specimens during the acute phase of infection. The lowest concentration of SARS-CoV-2 viral copies this assay can detect is 138 copies/mL. A negative result does not preclude SARS-Cov-2 infection and should not be used as the sole basis for treatment or other patient management decisions. A negative result may occur with  improper specimen collection/handling, submission of specimen other than nasopharyngeal swab,  presence of viral mutation(s) within the areas targeted by this assay, and inadequate number of viral copies(<138 copies/mL). A negative result must be combined with clinical observations, patient history, and epidemiological information. The expected result is Negative.  Fact Sheet for Patients:  EntrepreneurPulse.com.au  Fact Sheet for Healthcare Providers:  IncredibleEmployment.be  This test is no t yet approved or cleared by the [place] FDA and  has been authorized for detection and/or diagnosis of SARS-CoV-2 by FDA under an Emergency Use Authorization (EUA). This EUA will remain  in effect (meaning this test can be used) for the duration of the COVID-19 declaration under Section 564(b)(1) of the Act, 21 U.S.C.section 360bbb-3(b)(1), unless the authorization is terminated  or revoked sooner.       Influenza A by PCR NEGATIVE NEGATIVE Final   Influenza B by PCR NEGATIVE NEGATIVE Final    Comment: (NOTE) The Xpert Xpress SARS-CoV-2/FLU/RSV plus assay is intended as an aid in the diagnosis of influenza from Nasopharyngeal swab specimens and should not be used as a sole basis for treatment. Nasal washings and aspirates are unacceptable for Xpert Xpress SARS-CoV-2/FLU/RSV testing.  Fact Sheet for Patients: EntrepreneurPulse.com.au  Fact Sheet for Healthcare Providers: IncredibleEmployment.be  This test is not yet approved or cleared by the [place] FDA and has been authorized for detection and/or diagnosis of SARS-CoV-2 by FDA under an Emergency Use Authorization (EUA). This EUA will remain in effect (meaning this test can be used) for the duration of the COVID-19 declaration under Section 564(b)(1) of the Act, 21 U.S.C. section 360bbb-3(b)(1), unless the authorization is terminated or revoked.  Performed at [facility], [address], [place], [place] [address]   Urine Culture      Status: None   Collection Time: 12/10/20  6:14 PM   Specimen: Urine, Random  Result Value Ref Range Status   Specimen Description   Final    URINE, RANDOM Performed at [facility], [address] [place], [place] [address]    Special Requests   Final    NONE Performed at [facility], [address] [place], [place] [address]    Culture   Final    NO GROWTH Performed at [facility], [place] [address], [place], [place] [address]    Report Status 12/12/2020 FINAL  Final  MRSA Next Gen by PCR, Nasal     Status: None   Collection Time: 12/10/20  6:22 PM   Specimen: Nasal Mucosa; Nasal Swab  Result Value Ref Range Status   MRSA by  PCR Next Gen NOT DETECTED NOT DETECTED Final    Comment: (NOTE) The GeneXpert MRSA Assay (FDA approved for NASAL specimens only), is one component of a comprehensive MRSA colonization surveillance program. It is not intended to diagnose MRSA infection nor to guide or monitor treatment for MRSA infections. Test performance is not FDA approved in patients less than 75 years old. Performed at [facility], [address] [place], [place] [address]     Coagulation Studies: Recent Labs    12/11/20 0636 12/12/20 0609 12/13/20 0554  LABPROT 45.0* 38.4* 26.4*  INR 4.8* 3.9* 2.4*     Urinalysis: Recent Labs    12/10/20 1814  COLORURINE AMBER*  LABSPEC 1.013  PHURINE 5.0  GLUCOSEU NEGATIVE  HGBUR NEGATIVE  BILIRUBINUR NEGATIVE  KETONESUR NEGATIVE  PROTEINUR 30*  NITRITE NEGATIVE  LEUKOCYTESUR NEGATIVE       Imaging: No results found.   Medications:    sodium chloride     sodium chloride Stopped (12/11/20 1100)   furosemide (LASIX) 200 mg in dextrose 5% 100 mL ('2mg'$ /mL) infusion 5 mg/hr (12/13/20 0800)    budesonide (PULMICORT) nebulizer solution  0.5 mg Nebulization BID   calcitRIOL  0.25 mcg Oral Once per day on Mon Wed Fri   Chlorhexidine Gluconate Cloth  6 each Topical Daily   insulin  aspart  0-15 Units Subcutaneous TID WC   insulin aspart  0-5 Units Subcutaneous QHS   levofloxacin  750 mg Oral Q48H   methylPREDNISolone (SOLU-MEDROL) injection  40 mg Intravenous Q12H   midodrine  5 mg Oral TID WC   pravastatin  20 mg Oral q1800   sodium chloride flush  3 mL Intravenous Q12H   warfarin  2 mg Oral ONCE-1600   Warfarin - Pharmacist Dosing Inpatient   Does not apply q1600   sodium chloride, acetaminophen, levalbuterol, ondansetron (ZOFRAN) IV, sodium chloride flush  Assessment/ Plan:  70 y.o. male with   hypertension, coronary artery disease, congestive heart failure, chronic kidney disease  admitted on 12/09/2020 for Acute on chronic combined systolic and diastolic CHF, NYHA class 3 (HCC) [I50.43] Acute on chronic respiratory failure with hypoxia (HCC) [J96.21] Acute on chronic congestive heart failure, unspecified heart failure type ([place]) [I50.9]  Patient with hypertension, coronary artery disease, CHF, CKD, anemia admitted with worsening shortness of breath with exertion, worsening lower extremity edema and orthopnea  #AKI with CKD stage IIIa Baseline creatinine of 1.45/GFR 52  08/29 0701 - 08/30 0700 In: 456.3 [P.O.:400; I.V.:56.3] Out: 3650 [Urine:3650]   Lab Results  Component Value Date   CREATININE 2.48 (H) 12/13/2020   CREATININE 2.97 (H) 12/12/2020   CREATININE 2.74 (H) 12/11/2020    AKI likely secondary to abnormal hemodynamics from cardiorenal syndrome.  Urinalysis negative for blood.  Minimal protein. Serum creatinine has started to improve Urine output is maintained with IV furosemide infusion Patient insisting about going home today. Would stay off amlodipine.  Hold carvedilol and enalapril. Suggest to change diuretic to torsemide 40 mg daily  #Volume overload with lower extremity edema #Acute exacerbation of chronic systolic CHF. Age 71 2022-2D echo-LVEF 27 to 55%, low normal function, mild LVH, indeterminate diastolic parameters, moderately  dilated right atrium, right ventricular systolic function low normal, Avoid hypotension.   Can continue midodrine at home    LOS: [place] 8/30/20221:59 PM  [place], [place]  Note: This note was prepared with Dragon dictation. Any transcription errors are unintentional

## 2020-12-14 ENCOUNTER — Encounter: Payer: Self-pay | Admitting: Internal Medicine

## 2020-12-14 LAB — BLOOD GAS, ARTERIAL
Acid-Base Excess: 12 mmol/L — ABNORMAL HIGH (ref 0.0–2.0)
Allens test (pass/fail): POSITIVE — AB
Bicarbonate: 38.3 mmol/L — ABNORMAL HIGH (ref 20.0–28.0)
FIO2: 0.28
O2 Saturation: 93.6 %
Patient temperature: 37
pCO2 arterial: 59 mmHg — ABNORMAL HIGH (ref 32.0–48.0)
pH, Arterial: 7.42 (ref 7.350–7.450)
pO2, Arterial: 68 mmHg — ABNORMAL LOW (ref 83.0–108.0)

## 2020-12-14 LAB — GLUCOSE, CAPILLARY
Glucose-Capillary: 267 mg/dL — ABNORMAL HIGH (ref 70–99)
Glucose-Capillary: 315 mg/dL — ABNORMAL HIGH (ref 70–99)
Glucose-Capillary: 204 mg/dL — ABNORMAL HIGH (ref 70–99)
Glucose-Capillary: 371 mg/dL — ABNORMAL HIGH (ref 70–99)

## 2020-12-14 LAB — PROTIME-INR
INR: 2.2 — ABNORMAL HIGH (ref 0.8–1.2)
Prothrombin Time: 24.2 seconds — ABNORMAL HIGH (ref 11.4–15.2)

## 2020-12-14 LAB — MAGNESIUM: Magnesium: 2.1 mg/dL (ref 1.7–2.4)

## 2020-12-14 MED ORDER — INSULIN GLARGINE-YFGN 100 UNIT/ML ~~LOC~~ SOLN
5.0000 [IU] | Freq: Every day | SUBCUTANEOUS | Status: AC
  Administered 2020-12-14 – 2020-12-15 (×2): 5 [IU] via SUBCUTANEOUS
  Filled 2020-12-14 (×2): qty 0.05

## 2020-12-14 MED ORDER — CARVEDILOL 12.5 MG PO TABS
12.5000 mg | ORAL_TABLET | Freq: Two times a day (BID) | ORAL | Status: DC
  Administered 2020-12-14 – 2020-12-15 (×2): 12.5 mg via ORAL
  Filled 2020-12-14 (×2): qty 1

## 2020-12-14 MED ORDER — WARFARIN SODIUM 3 MG PO TABS
3.0000 mg | ORAL_TABLET | Freq: Once | ORAL | Status: AC
  Administered 2020-12-14: 3 mg via ORAL
  Filled 2020-12-14: qty 1

## 2020-12-14 MED ORDER — PREDNISONE 20 MG PO TABS
40.0000 mg | ORAL_TABLET | Freq: Every day | ORAL | Status: DC
  Administered 2020-12-15: 40 mg via ORAL
  Filled 2020-12-14: qty 2

## 2020-12-14 NOTE — Progress Notes (Signed)
[place], [place] 12/14/20  Subjective:   Hospital day # 5 Hospital course: Initially he was admitted for treatment of congestive heart failure.  Patient subsequently became lethargic with worsening shortness of breath and leg edema and is transferred to the intensive care unit  8/29-states he was able to eat a good breakfast.  Denies any nausea or vomiting.  Urine output appears to be increasing.  Serum creatinine is also worsening. Overall patient feels improved although he still has large amount of lower extremity edema all the way up to lower abdomen.  Requiring BiPAP at night.  Levophed drip was discontinued at 5 AM this morning.  8/30-continued on IV furosemide.  Improvement in serum creatinine is noted.  Urine output of over 3600 cc in the last 24 hours Patient is insisting on going home today.  8/31- transferred out of ICU yesterday evening. Remains on Lasix drip. Edema has improved. UOP 4.2L in 24 hours. Would like discharge, but not as insistent as yesterday. Feels better today, tolerating meals   Renal: 08/30 0701 - 08/31 0700 In: 1176.2 [P.O.:1120; I.V.:56.2] Out: 4200 [Urine:4200] Lab Results  Component Value Date   CREATININE 2.48 (H) 12/13/2020   CREATININE 2.97 (H) 12/12/2020   CREATININE 2.74 (H) 12/11/2020     Objective:  Vital signs in last 24 hours:  Temp:  [97.8 F (36.6 C)-98.8 F (37.1 C)] 98.1 F (36.7 C) (08/31 1120) Pulse Rate:  [43-129] 104 (08/31 1120) Resp:  [17-20] 18 (08/31 1120) BP: (118-134)/(73-87) 122/76 (08/31 1120) SpO2:  [92 %-100 %] 94 % (08/31 1120) Weight:  [121.6 kg] 121.6 kg (08/30 2214)  Weight change: -3.936 kg Filed Weights   12/12/20 0500 12/13/20 0500 12/13/20 2214  Weight: 123.5 kg 125.5 kg 121.6 kg    Intake/Output:    Intake/Output Summary (Last 24 hours) at 12/14/2020 1312 Last data filed at 12/14/2020 1113 Gross per 24 hour  Intake 670.37 ml  Output 5050 ml  Net -4379.63 ml       Physical Exam: General: No acute distress, laying in the bed  HEENT Anicteric, moist oral mucous membranes  Pulm/lungs Mild bilateral basilar crackles, [place] O2,   CVS/Heart No rub, irregular  Abdomen:  Distended, obese  Extremities: 2-3 lower extremity edema   Neurologic: Alert, able to follow commands  Skin: Warm, dry          Basic Metabolic Panel:  Recent Labs  Lab 12/10/20 0427 12/10/20 2009 12/10/20 2258 12/11/20 0636 12/11/20 2005 12/12/20 0609 12/13/20 0554 12/14/20 0436  NA 139 139  --  137  --  134* 138  --   K 4.6 5.5* 5.2* 5.3* 5.3* 4.9 4.2  --   CL 102 100  --  100  --  98 97*  --   CO2 32 32  --  31  --  32 33*  --   GLUCOSE 170* 122*  --  214*  --  219* 208*  --   BUN 18 30*  --  42*  --  64* 71*  --   CREATININE 1.66* 2.53*  --  2.74*  --  2.97* 2.48*  --   CALCIUM 8.4* 8.3*  --  8.5*  --  8.8* 9.0  --   MG  --   --   --  2.0  --  2.3 2.1 2.1  PHOS  --   --   --  5.2*  --  4.2  --   --  CBC: Recent Labs  Lab 12/09/20 1445 12/10/20 2009 12/11/20 0636 12/12/20 0609 12/13/20 0554  WBC 4.7 10.1 10.8* 7.5 7.3  NEUTROABS  --   --   --   --  6.9  HGB 10.0* 9.9* 10.1* 9.3* 9.7*  HCT 32.6* 34.4* 33.9* 30.5* 30.9*  MCV 90.3 95.6 93.4 89.7 89.3  PLT 122* 104* 108* 109* 108*      No results found for: HEPBSAG, HEPBSAB, HEPBIGM    Microbiology:  Recent Results (from the past 240 hour(s))  Resp Panel by RT-PCR (Flu A&B, Covid) Nasopharyngeal Swab     Status: None   Collection Time: 12/09/20  5:11 PM   Specimen: Nasopharyngeal Swab; Nasopharyngeal(NP) swabs in vial transport medium  Result Value Ref Range Status   SARS Coronavirus 2 by RT PCR NEGATIVE NEGATIVE Final    Comment: (NOTE) SARS-CoV-2 target nucleic acids are NOT DETECTED.  The SARS-CoV-2 RNA is generally detectable in upper respiratory specimens during the acute phase of infection. The lowest concentration of SARS-CoV-2 viral copies this assay can detect is 138  copies/mL. A negative result does not preclude SARS-Cov-2 infection and should not be used as the sole basis for treatment or other patient management decisions. A negative result may occur with  improper specimen collection/handling, submission of specimen other than nasopharyngeal swab, presence of viral mutation(s) within the areas targeted by this assay, and inadequate number of viral copies(<138 copies/mL). A negative result must be combined with clinical observations, patient history, and epidemiological information. The expected result is Negative.  Fact Sheet for Patients:  EntrepreneurPulse.com.au  Fact Sheet for Healthcare Providers:  IncredibleEmployment.be  This test is no t yet approved or cleared by the [place] FDA and  has been authorized for detection and/or diagnosis of SARS-CoV-2 by FDA under an Emergency Use Authorization (EUA). This EUA will remain  in effect (meaning this test can be used) for the duration of the COVID-19 declaration under Section 564(b)(1) of the Act, 21 U.S.C.section 360bbb-3(b)(1), unless the authorization is terminated  or revoked sooner.       Influenza A by PCR NEGATIVE NEGATIVE Final   Influenza B by PCR NEGATIVE NEGATIVE Final    Comment: (NOTE) The Xpert Xpress SARS-CoV-2/FLU/RSV plus assay is intended as an aid in the diagnosis of influenza from Nasopharyngeal swab specimens and should not be used as a sole basis for treatment. Nasal washings and aspirates are unacceptable for Xpert Xpress SARS-CoV-2/FLU/RSV testing.  Fact Sheet for Patients: EntrepreneurPulse.com.au  Fact Sheet for Healthcare Providers: IncredibleEmployment.be  This test is not yet approved or cleared by the [place] FDA and has been authorized for detection and/or diagnosis of SARS-CoV-2 by FDA under an Emergency Use Authorization (EUA). This EUA will remain in effect (meaning  this test can be used) for the duration of the COVID-19 declaration under Section 564(b)(1) of the Act, 21 U.S.C. section 360bbb-3(b)(1), unless the authorization is terminated or revoked.  Performed at [facility], [address], [place], [place] [address]   Urine Culture     Status: None   Collection Time: 12/10/20  6:14 PM   Specimen: Urine, Random  Result Value Ref Range Status   Specimen Description   Final    URINE, RANDOM Performed at [facility], [address], [place], [place] [address]    Special Requests   Final    NONE Performed at [facility], [address], [place], [place] [address]    Culture   Final  NO GROWTH Performed at [facility], [place] [address], [place], [place] [address]    Report Status 12/12/2020 FINAL  Final  MRSA Next Gen by PCR, Nasal     Status: None   Collection Time: 12/10/20  6:22 PM   Specimen: Nasal Mucosa; Nasal Swab  Result Value Ref Range Status   MRSA by PCR Next Gen NOT DETECTED NOT DETECTED Final    Comment: (NOTE) The GeneXpert MRSA Assay (FDA approved for NASAL specimens only), is one component of a comprehensive MRSA colonization surveillance program. It is not intended to diagnose MRSA infection nor to guide or monitor treatment for MRSA infections. Test performance is not FDA approved in patients less than 23 years old. Performed at [facility], [address] [place], [place] [address]     Coagulation Studies: Recent Labs    12/12/20 [id number] 12/13/20 0554 12/14/20 0436  LABPROT 38.4* 26.4* 24.2*  INR 3.9* 2.4* 2.2*     Urinalysis: No results for input(s): COLORURINE, LABSPEC, PHURINE, GLUCOSEU, HGBUR, BILIRUBINUR, KETONESUR, PROTEINUR, UROBILINOGEN, NITRITE, LEUKOCYTESUR in the last 72 hours.  Invalid input(s): APPERANCEUR     Imaging: No results found.   Medications:    sodium chloride     sodium chloride Stopped (12/11/20 1100)   furosemide (LASIX)  200 mg in dextrose 5% 100 mL ('2mg'$ /mL) infusion 5 mg/hr (12/14/20 0919)    budesonide (PULMICORT) nebulizer solution  0.5 mg Nebulization BID   calcitRIOL  0.25 mcg Oral Once per day on Mon Wed Fri   carvedilol  12.5 mg Oral BID WC   Chlorhexidine Gluconate Cloth  6 each Topical Daily   insulin aspart  0-15 Units Subcutaneous TID WC   insulin aspart  0-5 Units Subcutaneous QHS   insulin glargine-yfgn  5 Units Subcutaneous QHS   midodrine  5 mg Oral TID WC   pravastatin  20 mg Oral q1800   [START ON 12/15/2020] predniSONE  40 mg Oral Q breakfast   sodium chloride flush  3 mL Intravenous Q12H   traZODone  50 mg Oral QHS   warfarin  3 mg Oral ONCE-1600   Warfarin - Pharmacist Dosing Inpatient   Does not apply q1600   sodium chloride, acetaminophen, levalbuterol, ondansetron (ZOFRAN) IV, sodium chloride flush  Assessment/ Plan:  70 y.o. male with   hypertension, coronary artery disease, congestive heart failure, chronic kidney disease  admitted on 12/09/2020 for Acute on chronic combined systolic and diastolic CHF, NYHA class 3 (HCC) [I50.43] Acute on chronic respiratory failure with hypoxia (HCC) [J96.21] Acute on chronic congestive heart failure, unspecified heart failure type ([place]) [I50.9]  Patient with hypertension, coronary artery disease, CHF, CKD, anemia admitted with worsening shortness of breath with exertion, worsening lower extremity edema and orthopnea  #AKI with CKD stage IIIa Baseline creatinine of 1.45/GFR 52  08/30 0701 - 08/31 0700 In: 1176.2 [P.O.:1120; I.V.:56.2] Out: 4200 [Urine:4200]   Lab Results  Component Value Date   CREATININE 2.48 (H) 12/13/2020   CREATININE 2.97 (H) 12/12/2020   CREATININE 2.74 (H) 12/11/2020    AKI likely secondary to abnormal hemodynamics from cardiorenal syndrome.  Urinalysis negative for blood.  Minimal protein. Serum creatinine improving IV furosemide infusion, UOP 4.2L Would stay off amlodipine.  Hold carvedilol and  enalapril. Suggest to change diuretic to torsemide 40 mg daily With improvements made overnight, would be agreeable to discharge home and maintain scheduled appt at our office tomorrow.  #Volume overload with lower extremity edema #Acute exacerbation of chronic systolic CHF.  Age 89 2022-2D echo-LVEF 80 to 55%, low normal function, mild LVH, indeterminate diastolic parameters, moderately dilated right atrium, right ventricular systolic function low normal, Avoid hypotension.   Can continue midodrine at home    LOS: [place] 8/31/20221:12 PM  [place], [place]

## 2020-12-14 NOTE — Progress Notes (Signed)
Physical Therapy Treatment Patient Details Name: Timothy Chavez MRN: [id number] DOB: 25-Aug-1950 Today's Date: 12/14/2020    History of Present Illness Patient is a 70 y.o male admitted with 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 failure secondary to acute systolic CHF exacerbation, right sided pleural effusion, and possible pneumonia. Past medical history of CHF, CAD, CKD, HTN, GERD, diabetes.    PT Comments    Pt received supine in bed, agreeable to therapy. Recliner was set up for pt to end session in. Bed mobility performed Mod I with bed features. STS performed with RW - plan to progress to no AD. Significant increase in ambulation distance today. First 128f of ambulation using RW and SUP, no balance concerns with AD. RW was removed for remainder of ambulation with CGA-close SUP for safety, no steadying required. He does demo increased lateral weight shift w/o AD. 3L O2 with activity; sats remained 94% or greater. Cardiac rhythm did enter a-fib during ambulation - RN notified. Pt asked questions regarding transportation to a doctors appointment - CM notified. Questions also asked and answered regarding mobility without PT - PT encouraged ambulation with nursing staff. Would benefit from skilled PT to address above deficits and promote optimal return to PLOF.   Follow Up Recommendations  Home health PT;Supervision - Intermittent     Equipment Recommendations  None recommended by PT (patient has RW, shower chair, and ramp in place at home)    Recommendations for Other Services       Precautions / Restrictions Precautions Precautions: Fall Restrictions Weight Bearing Restrictions: No    Mobility  Bed Mobility Overal bed mobility: Modified Independent             General bed mobility comments: HOB elevated, increased time and effort    Transfers Overall transfer level: Needs assistance Equipment used: Rolling walker (2 wheeled) Transfers: Sit to/from Stand Sit  to Stand: Supervision         General transfer comment: SUP for safety, no steadying required  Ambulation/Gait Ambulation/Gait assistance: Min guard;Supervision Gait Distance (Feet): 680 Feet Assistive device: Rolling walker (2 wheeled);None Gait Pattern/deviations: Step-through pattern;Wide base of support Gait velocity: decreased   General Gait Details: First 1812fwith RW and SUP. Pt appeared steady so RW was removed for final 500 feet with CGA-close SUP for safety due to increased lateral weight shifting. Gait velocity decreased with final lap. PT managed O2 and IV pole throughout.   Stairs             Wheelchair Mobility    Modified Rankin (Stroke Patients Only)       Balance Overall balance assessment: Needs assistance Sitting-balance support: No upper extremity supported;Feet supported Sitting balance-Leahy Scale: Good Sitting balance - Comments: no LOB sitting EOB   Standing balance support: During functional activity;Bilateral upper extremity supported Standing balance-Leahy Scale: Fair Standing balance comment: able to stand and ambulate without AD, CGA for safety                            Cognition Arousal/Alertness: Awake/alert Behavior During Therapy: [facility] tasks assessed/performed                                   General Comments: patient able to follow all commands. motivated      Exercises      General Comments  Pertinent Vitals/Pain Pain Assessment: No/denies pain    Home Living                      Prior Function            PT Goals (current goals can now be found in the care plan section) Acute Rehab PT Goals Patient Stated Goal: to go home PT Goal Formulation: With patient Time For Goal Achievement: 12/26/20 Potential to Achieve Goals: Good    Frequency    Min 2X/week      PT Plan      Co-evaluation              AM-PAC PT "6 Clicks" Mobility   Outcome  Measure  Help needed turning from your back to your side while in a flat bed without using bedrails?: A Little Help needed moving from lying on your back to sitting on the side of a flat bed without using bedrails?: A Little Help needed moving to and from a bed to a chair (including a wheelchair)?: A Little Help needed standing up from a chair using your arms (e.g., wheelchair or bedside chair)?: A Little Help needed to walk in hospital room?: A Little Help needed climbing 3-5 steps with a railing? : A Lot 6 Click Score: 17    End of Session Equipment Utilized During Treatment: Gait belt Activity Tolerance: Patient tolerated treatment well Patient left: in chair;with call bell/phone within reach;with chair alarm set Nurse Communication: Mobility status PT Visit Diagnosis: Unsteadiness on feet (R26.81);Muscle weakness (generalized) (M62.81)     Time: [id number] PT Time Calculation (min) (ACUTE ONLY): 40 min  Charges:  $Gait Training: 8-22 mins $Therapeutic Activity: 23-37 mins                     Patrina Levering PT, DPT 12/14/20 2:28 PM [phone]

## 2020-12-14 NOTE — Progress Notes (Signed)
Tele tech calls states that HR is in RVR 150's.Went to pt room, pt asymptomatic.  NP Sharion Settler notified. Awaiting response.

## 2020-12-14 NOTE — Progress Notes (Signed)
Inpatient Diabetes Program Recommendations  AACE/ADA: New Consensus Statement on Inpatient Glycemic Control   Target Ranges:  Prepandial:   less than 140 mg/dL      Peak postprandial:   less than 180 mg/dL (1-2 hours)      Critically ill patients:  140 - 180 mg/dL   Results for Timothy Chavez, Timothy Chavez (MRN [id number]) as of 12/14/2020 09:22  Ref. Range 12/13/2020 07:24 12/13/2020 11:24 12/13/2020 16:15 12/13/2020 21:47 12/14/2020 07:43  Glucose-Capillary Latest Ref Range: 70 - 99 mg/dL 197 (H) 293 (H) 164 (H) 264 (H) 267 (H)    Review of Glycemic Control  Diabetes history: DM2 Outpatient Diabetes medications: None Current orders for Inpatient glycemic control: Novolog 0-15 units TID with meals, Novolog 0-5 units QHS; Solumedrol 40 mg Q12H   Inpatient Diabetes Program Recommendations:     Insulin: If steroids are continued, please consider ordering Semglee 5 units Q24H and Novolog 3 units TID with meals for meal coverage if patient eats at least 50% of meals.  Thanks, Barnie Alderman, RN, MSN, CDE Diabetes Coordinator Inpatient Diabetes Program [phone] (Team Pager from 8am to 5pm)

## 2020-12-14 NOTE — Consult Note (Addendum)
   Heart Failure Nurse Navigator Note  HFmrEF 50-55%.  Mild LVH.  He presented to the emergency room with complaints of shortness of breath.  Comorbidities:  Paroxysmal atrial fibrillation Coronary artery disease Chronic kidney disease stage III Essential hypertension Type 2 diabetes   Medications:  Coreg 12.5 mg 2 times a day with meals Lasix infusion at 5 mg an hour Midodrine 5 mg 3 times a day with meals Pravastatin 20 mg in the evening Warfarin  Labs:  From 12/13/2020  Sodium 138, potassium 4.2, chloride 97, CO2 33, BUN 71, creatinine 2.48, GFR 28 Weight 121.6 down from 125.5 of yesterday Intake 1176 mL Output 4200 mL  Initial meeting with patient today, he had just worked with physical therapy and was currently sitting up in the chair at bedside.  He is pleasant and talkative.  He states that he has heard the term heart failure before but had not been hospitalized for it for 12 years.  He says he has a scale but he does not like to weigh himself daily as it is too depressing.  Discussed  the importance of weighing daily and recording and what to report to physician.  Discussed the salt and sodium in his diet.  He states the only thing he add salt to is his potato, he does use canned vegetables but he does rinse those well and cooks in plain water.  He states that an old girlfriend stressed the importance of low sodium and low salt.  He was given the living with heart failure teaching booklet along with his zone magnet and information on low salt.  We will continue to follow  He has an appointment with the outpatient heart failure clinic on September 7 at [facility]

## 2020-12-14 NOTE — Progress Notes (Signed)
Tele tech calls and states that pt was currently in RVR 140's. Went to bedside to check on pt. Pt asymptomatic. Pt heart rate now in the low 100's. Pt sitting comfortably in the chair. NP Sharion Settler notified. Call bell within reach. Pt has no concerns at this time. Will continue to monitor.

## 2020-12-14 NOTE — Care Management Important Message (Signed)
Important Message  Patient Details  Name: Timothy Chavez MRN: [id number] Date of Birth: 11/25/1950   Medicare Important Message Given:  Yes     Dannette Barbara 12/14/2020, 11:02 AM

## 2020-12-14 NOTE — Progress Notes (Addendum)
Progress Note    WA RUMPF  [id number] DOB: [birth year]  DOA: 12/09/2020 PCP: Idelle Crouch, MD      Brief Narrative:    Medical records reviewed and are as summarized below:  Timothy Chavez is a 70 y.o. male with medical history significant of systolic dysfunction CHF with EF of 45% from echocardiogram on May 10, 2020, paroxysmal atrial fibrillation, coronary artery disease, chronic kidney disease stage III, essential hypertension, GERD, and diabetes type 2 who presented to the ER with progressive shortness of breath lower extremity swelling, orthopnea and PND.     Assessment/Plan:   Principal Problem:   Acute on chronic combined systolic and diastolic CHF, NYHA class 3 (HCC) Active Problems:   Benign essential hypertension   CAD (coronary artery disease), native coronary artery   Chronic kidney disease, stage III (moderate) (HCC)   Chronic systolic heart failure (HCC)   Current use of long term anticoagulation   GERD (gastroesophageal reflux disease)   Hyperlipidemia   Hypertension   PAF (paroxysmal atrial fibrillation) (HCC)   Secondary hyperparathyroidism of renal origin ([place])   Type II diabetes mellitus with complication (HCC)   Acute on chronic congestive heart failure (HCC)   Acute on chronic respiratory failure with hypoxia (HCC)     Body mass index is 36.35 kg/m.  (Morbid obesity)   Acute on chronic diastolic and systolic, cardiogenic shock: Continue IV Lasix infusion.  He is off of IV norepinephrine.  Monitor BMP, daily weight and urine output.  2D echo showed EF estimated at 50 to 55% (improved from 40 to 45%)  enalapril has been held because of hypotension. Resume carvedilol because of tachycardia from A. fib  AKI on CKD stage IIIa: Enalapril on hold.  Monitor BMP closely.  Nephrologist on board.  Hyperkalemia: Improved  COPD exacerbation: Change IV to Solu-Medrol to oral prednisone.  Continue bronchodilators.  Acute hypoxic  and hypercapnic respiratory failure: Suspect patient has chronic hypoxia and hypercapnia as well.  He may benefit from BiPAP at home.  ABG showed pH 7.42, PCO2 59, PO2 68 bicarb 38.3 on 2 L/min oxygen.  Consulted pulmonologist to assist with management.  Check pulse oximetry with ambulation.  Paroxysmal atrial fibrillation with RVR: Restart carvedilol at a lower dose (12.5 twice daily).  Continue Coumadin.  Type 2 diabetes mellitus with hyperglycemia: Start insulin glargine.  Continue NovoLog.  Other comorbidities include CAD, hypertension, paroxysmal atrial fibrillation on Coumadin     Diet Order             Diet heart healthy/carb modified Room service appropriate? Yes; Fluid consistency: Thin; Fluid restriction: Other (see comments)  Diet effective now                      Consultants: Nephrologist  Procedures: None    Medications:    budesonide (PULMICORT) nebulizer solution  0.5 mg Nebulization BID   calcitRIOL  0.25 mcg Oral Once per day on Mon Wed Fri   Chlorhexidine Gluconate Cloth  6 each Topical Daily   insulin aspart  0-15 Units Subcutaneous TID WC   insulin aspart  0-5 Units Subcutaneous QHS   methylPREDNISolone (SOLU-MEDROL) injection  40 mg Intravenous Q12H   midodrine  5 mg Oral TID WC   pravastatin  20 mg Oral q1800   sodium chloride flush  3 mL Intravenous Q12H   traZODone  50 mg Oral QHS   warfarin  3 mg Oral ONCE-1600   Warfarin -  Pharmacist Dosing Inpatient   Does not apply q1600   Continuous Infusions:  sodium chloride     sodium chloride Stopped (12/11/20 1100)   furosemide (LASIX) 200 mg in dextrose 5% 100 mL ('2mg'$ /mL) infusion 5 mg/hr (12/14/20 0919)     Anti-infectives (From admission, onward)    Start     Dose/Rate Route Frequency Ordered Stop   12/12/20 2000  levofloxacin (LEVAQUIN) IVPB 750 mg  Status:  Discontinued        750 mg 100 mL/hr over 90 Minutes Intravenous Every 48 hours 12/10/20 2130 12/12/20 0903   12/12/20 1000   levofloxacin (LEVAQUIN) tablet 750 mg        750 mg Oral Every 48 hours 12/12/20 0903 12/14/20 0829   12/10/20 1930  levofloxacin (LEVAQUIN) IVPB 750 mg  Status:  Discontinued        750 mg 100 mL/hr over 90 Minutes Intravenous Every 24 hours 12/10/20 1821 12/10/20 2130              Family Communication/Anticipated D/C date and plan/Code Status   DVT prophylaxis:  warfarin (COUMADIN) tablet 3 mg     Code Status: DNR  Family Communication: None Disposition Plan:    Status is: Inpatient  Remains inpatient appropriate because:IV treatments appropriate due to intensity of illness or inability to take PO  Dispo: The patient is from: Home              Anticipated d/c is to: Home              Patient currently is not medically stable to d/c.   Difficult to place patient No           Subjective:   c/o leg swelling  Objective:    Vitals:   12/14/20 0022 12/14/20 0046 12/14/20 0356 12/14/20 0743  BP: 134/77  124/87 134/73  Pulse: (!) 123 (!) 104 (!) 106 (!) 110  Resp: '18  20 17  '$ Temp: 97.8 F (36.6 C)  98.3 F (36.8 C) 98.8 F (37.1 C)  TempSrc:    Oral  SpO2: 97%  92% 92%  Weight:      Height:       No data found.   Intake/Output Summary (Last 24 hours) at 12/14/2020 1047 Last data filed at 12/14/2020 0919 Gross per 24 hour  Intake 930.36 ml  Output 4300 ml  Net -3369.64 ml   Filed Weights   12/12/20 0500 12/13/20 0500 12/13/20 2214  Weight: 123.5 kg 125.5 kg 121.6 kg    Exam:  GEN: NAD SKIN: Warm and dry EYES: No pallor or icterus ENT: MMM CV: Irregular rate and rhythm, tachycardic PULM: CTA B ABD: soft, obese, NT, +BS CNS: AAO x 3, non focal EXT: B/l leg edema (3+), no  tenderness        Data Reviewed:   I have personally reviewed following labs and imaging studies:  Labs: Labs show the following:   Basic Metabolic Panel: Recent Labs  Lab 12/10/20 0427 12/10/20 2009 12/10/20 2258 12/11/20 0636 12/11/20 2005  12/12/20 0609 12/13/20 0554 12/14/20 0436  NA 139 139  --  137  --  134* 138  --   K 4.6 5.5*   < > 5.3*   < > 4.9 4.2  --   CL 102 100  --  100  --  98 97*  --   CO2 32 32  --  31  --  32 33*  --   GLUCOSE 170* 122*  --  214*  --  219* 208*  --   BUN 18 30*  --  42*  --  64* 71*  --   CREATININE 1.66* 2.53*  --  2.74*  --  2.97* 2.48*  --   CALCIUM 8.4* 8.3*  --  8.5*  --  8.8* 9.0  --   MG  --   --   --  2.0  --  2.3 2.1 2.1  PHOS  --   --   --  5.2*  --  4.2  --   --    < > = values in this interval not displayed.   GFR Estimated Creatinine Clearance: 38.4 mL/min (A) (by C-G formula based on SCr of 2.48 mg/dL (H)). Liver Function Tests: Recent Labs  Lab 12/12/20 0609  AST 15  ALT 10  ALKPHOS 55  BILITOT 0.7  PROT 6.2*  ALBUMIN 2.4*   No results for input(s): LIPASE, AMYLASE in the last 168 hours. No results for input(s): AMMONIA in the last 168 hours. Coagulation profile Recent Labs  Lab 12/10/20 0427 12/11/20 0636 12/12/20 0609 12/13/20 0554 12/14/20 0436  INR 3.6* 4.8* 3.9* 2.4* 2.2*    CBC: Recent Labs  Lab 12/09/20 1445 12/10/20 2009 12/11/20 0636 12/12/20 0609 12/13/20 0554  WBC 4.7 10.1 10.8* 7.5 7.3  NEUTROABS  --   --   --   --  6.9  HGB 10.0* 9.9* 10.1* 9.3* 9.7*  HCT 32.6* 34.4* 33.9* 30.5* 30.9*  MCV 90.3 95.6 93.4 89.7 89.3  PLT 122* 104* 108* 109* 108*   Cardiac Enzymes: No results for input(s): CKTOTAL, CKMB, CKMBINDEX, TROPONINI in the last 168 hours. BNP (last 3 results) No results for input(s): PROBNP in the last 8760 hours. CBG: Recent Labs  Lab 12/13/20 0724 12/13/20 1124 12/13/20 1615 12/13/20 2147 12/14/20 0743  GLUCAP 197* 293* 164* 264* 267*   D-Dimer: No results for input(s): DDIMER in the last 72 hours. Hgb A1c: No results for input(s): HGBA1C in the last 72 hours. Lipid Profile: No results for input(s): CHOL, HDL, LDLCALC, TRIG, CHOLHDL, LDLDIRECT in the last 72 hours. Thyroid function studies: No results for  input(s): TSH, T4TOTAL, T3FREE, THYROIDAB in the last 72 hours.  Invalid input(s): FREET3 Anemia work up: No results for input(s): VITAMINB12, FOLATE, FERRITIN, TIBC, IRON, RETICCTPCT in the last 72 hours. Sepsis Labs: Recent Labs  Lab 12/10/20 0427 12/10/20 2009 12/10/20 2258 12/11/20 0636 12/12/20 0609 12/13/20 0554  PROCALCITON 5.34  --   --   --   --   --   WBC  --  10.1  --  10.8* 7.5 7.3  LATICACIDVEN  --  1.7 1.5  --   --   --     Microbiology Recent Results (from the past 240 hour(s))  Resp Panel by RT-PCR (Flu A&B, Covid) Nasopharyngeal Swab     Status: None   Collection Time: 12/09/20  5:11 PM   Specimen: Nasopharyngeal Swab; Nasopharyngeal(NP) swabs in vial transport medium  Result Value Ref Range Status   SARS Coronavirus 2 by RT PCR NEGATIVE NEGATIVE Final    Comment: (NOTE) SARS-CoV-2 target nucleic acids are NOT DETECTED.  The SARS-CoV-2 RNA is generally detectable in upper respiratory specimens during the acute phase of infection. The lowest concentration of SARS-CoV-2 viral copies this assay can detect is 138 copies/mL. A negative result does not preclude SARS-Cov-2 infection and should not be used as the sole basis for treatment or other patient management decisions. A negative result may  occur with  improper specimen collection/handling, submission of specimen other than nasopharyngeal swab, presence of viral mutation(s) within the areas targeted by this assay, and inadequate number of viral copies(<138 copies/mL). A negative result must be combined with clinical observations, patient history, and epidemiological information. The expected result is Negative.  Fact Sheet for Patients:  EntrepreneurPulse.com.au  Fact Sheet for Healthcare Providers:  IncredibleEmployment.be  This test is no t yet approved or cleared by the [place] FDA and  has been authorized for detection and/or diagnosis of SARS-CoV-2 by FDA  under an Emergency Use Authorization (EUA). This EUA will remain  in effect (meaning this test can be used) for the duration of the COVID-19 declaration under Section 564(b)(1) of the Act, 21 U.S.C.section 360bbb-3(b)(1), unless the authorization is terminated  or revoked sooner.       Influenza A by PCR NEGATIVE NEGATIVE Final   Influenza B by PCR NEGATIVE NEGATIVE Final    Comment: (NOTE) The Xpert Xpress SARS-CoV-2/FLU/RSV plus assay is intended as an aid in the diagnosis of influenza from Nasopharyngeal swab specimens and should not be used as a sole basis for treatment. Nasal washings and aspirates are unacceptable for Xpert Xpress SARS-CoV-2/FLU/RSV testing.  Fact Sheet for Patients: EntrepreneurPulse.com.au  Fact Sheet for Healthcare Providers: IncredibleEmployment.be  This test is not yet approved or cleared by the [place] FDA and has been authorized for detection and/or diagnosis of SARS-CoV-2 by FDA under an Emergency Use Authorization (EUA). This EUA will remain in effect (meaning this test can be used) for the duration of the COVID-19 declaration under Section 564(b)(1) of the Act, 21 U.S.C. section 360bbb-3(b)(1), unless the authorization is terminated or revoked.  Performed at [facility], [address] [place], [place] [address]   Urine Culture     Status: None   Collection Time: 12/10/20  6:14 PM   Specimen: Urine, Random  Result Value Ref Range Status   Specimen Description   Final    URINE, RANDOM Performed at [facility], [address] [place], [place] [address]    Special Requests   Final    NONE Performed at [facility], [address], [place], [place] [address]    Culture   Final    NO GROWTH Performed at [facility], [place] [address] [place], [place] [address]    Report Status 12/12/2020 FINAL  Final  MRSA Next Gen by PCR, Nasal     Status: None   Collection  Time: 12/10/20  6:22 PM   Specimen: Nasal Mucosa; Nasal Swab  Result Value Ref Range Status   MRSA by PCR Next Gen NOT DETECTED NOT DETECTED Final    Comment: (NOTE) The GeneXpert MRSA Assay (FDA approved for NASAL specimens only), is one component of a comprehensive MRSA colonization surveillance program. It is not intended to diagnose MRSA infection nor to guide or monitor treatment for MRSA infections. Test performance is not FDA approved in patients less than 58 years old. Performed at [facility], [address] [place], [place] [address]     Procedures and diagnostic studies:  No results found.             LOS: 5 days   Docie Abramovich  [facility]   Pager on [email or web address]. If 7PM-7AM, please contact night-coverage at [email or web address]     12/14/2020, 10:47 AM

## 2020-12-14 NOTE — Consult Note (Signed)
Pulmonary Medicine          Date: 12/14/2020,   MRN# [id number] Timothy Chavez 17-Sep-1950     AdmissionWeight: 127 kg                 CurrentWeight: 121.6 kg   Referring physician: Dr Mal Misty   CHIEF COMPLAINT:   Acute hypoxemic respiratory failure   HISTORY OF PRESENT ILLNESS   This is a 70 year old male with a history of significant comorbid history as below including atrial fibrillation congestive heart failure coronary disease chronic renal failure and essential hypertension came in with acute hypoxemic respiratory failure.  He was placed on supplemental oxygen however continued to be hypoxemic.  Blood work on initial admission showed elevation of BNP with mild anemia with a hemoglobin of 10 and elevated INR at 3.6.  He was negative for COVID and influenza.  He had chest x-ray which showed interstitial edema and moderate right pleural effusion.  He was subsequently noted to be lethargic and hypotensive and was transferred to stepdown unit transiently required Levophed drip as well as BiPAP.  ABG was done which showed hypercapnia with hypoxemia.  He shares BIPAP is disruptive to his sleep and is keeping him awake.   PAST MEDICAL HISTORY   Past Medical History:  Diagnosis Date   A-fib [facility])    CHF (congestive heart failure) ([place])    Coronary artery disease    Hypertension    Renal disorder      SURGICAL HISTORY   History reviewed. No pertinent surgical history.   FAMILY HISTORY   History reviewed. No pertinent family history.   SOCIAL HISTORY   Social History   Tobacco Use   Smoking status: Never    Passive exposure: Never   Smokeless tobacco: Never  Substance Use Topics   Alcohol use: Not Currently   Drug use: Not Currently     MEDICATIONS    Home Medication:    Current Medication:  Current Facility-Administered Medications:    0.9 %  sodium chloride infusion, 250 mL, Intravenous, PRN, Jonelle Sidle, Mohammad L, MD   0.9 %  sodium chloride  infusion, 250 mL, Intravenous, Continuous, Dorothe Pea, RPH, Stopped at 12/11/20 1100   acetaminophen (TYLENOL) tablet 650 mg, 650 mg, Oral, Q4H PRN, Jonelle Sidle, Mohammad L, MD   budesonide (PULMICORT) nebulizer solution 0.5 mg, 0.5 mg, Nebulization, BID, Graves, Dana E, NP, 0.5 mg at 12/14/20 [id number]   calcitRIOL (ROCALTROL) capsule 0.25 mcg, 0.25 mcg, Oral, Once per day on Mon Wed Fri, Garba, Mohammad L, MD, 0.25 mcg at 12/14/20 1126   carvedilol (COREG) tablet 12.5 mg, 12.5 mg, Oral, BID WC, Jennye Boroughs, MD   Chlorhexidine Gluconate Cloth 2 % PADS 6 each, 6 each, Topical, Daily, Fritzi Mandes, MD, 6 each at 12/14/20 0830   furosemide (LASIX) 200 mg in dextrose 5 % 100 mL (2 mg/mL) infusion, 5 mg/hr, Intravenous, Continuous, Ouma, Bing Neighbors, NP, Last Rate: 2.5 mL/hr at 12/14/20 0919, 5 mg/hr at 12/14/20 0919   insulin aspart (novoLOG) injection 0-15 Units, 0-15 Units, Subcutaneous, TID WC, Garba, Mohammad L, MD, 11 Units at 12/14/20 1126   insulin aspart (novoLOG) injection 0-5 Units, 0-5 Units, Subcutaneous, QHS, Garba, Mohammad L, MD, 3 Units at 12/13/20 2242   insulin glargine-yfgn (SEMGLEE) injection 5 Units, 5 Units, Subcutaneous, QHS, Jennye Boroughs, MD   levalbuterol (XOPENEX) nebulizer solution 1.25 mg, 1.25 mg, Nebulization, Q6H PRN, Fritzi Mandes, MD   midodrine (PROAMATINE) tablet 5 mg, 5 mg, Oral,  TID WC, Murlean Iba, MD, 5 mg at 12/14/20 1126   ondansetron (ZOFRAN) injection 4 mg, 4 mg, Intravenous, Q6H PRN, Jonelle Sidle, Mohammad L, MD   pravastatin (PRAVACHOL) tablet 20 mg, 20 mg, Oral, q1800, Jonelle Sidle, Mohammad L, MD, 20 mg at 12/13/20 1747   [START ON 12/15/2020] predniSONE (DELTASONE) tablet 40 mg, 40 mg, Oral, Q breakfast, Jennye Boroughs, MD   sodium chloride flush (NS) 0.9 % injection 3 mL, 3 mL, Intravenous, Q12H, Jonelle Sidle, Mohammad L, MD, 3 mL at 12/14/20 0831   sodium chloride flush (NS) 0.9 % injection 3 mL, 3 mL, Intravenous, PRN, Elwyn Reach, MD   traZODone (DESYREL) tablet  50 mg, 50 mg, Oral, QHS, Sharion Settler, NP, 50 mg at 12/13/20 2241   warfarin (COUMADIN) tablet 3 mg, 3 mg, Oral, ONCE-1600, Shanlever, Pierce Crane, RPH   Warfarin - Pharmacist Dosing Inpatient, , Does not apply, q1600, Dorothe Pea, RPH    ALLERGIES   Penicillin v and Penicillins     REVIEW OF SYSTEMS    Review of Systems:  Gen:  Denies  fever, sweats, chills weigh loss  HEENT: Denies blurred vision, double vision, ear pain, eye pain, hearing loss, nose bleeds, sore throat Cardiac:  No dizziness, chest pain or heaviness, chest tightness,edema Resp:   Denies cough or sputum porduction, shortness of breath,wheezing, hemoptysis,  Gi: Denies swallowing difficulty, stomach pain, nausea or vomiting, diarrhea, constipation, bowel incontinence Gu:  Denies bladder incontinence, burning urine Ext:   Denies Joint pain, stiffness or swelling Skin: Denies  skin rash, easy bruising or bleeding or hives Endoc:  Denies polyuria, polydipsia , polyphagia or weight change Psych:   Denies depression, insomnia or hallucinations   Other:  All other systems negative   VS: BP 122/76 (BP Location: Left Arm)   Pulse (!) 104   Temp 98.1 F (36.7 C)   Resp 18   Ht 6' (1.829 m)   Wt 121.6 kg   SpO2 94%   BMI 36.35 kg/m      PHYSICAL EXAM    GENERAL:NAD, no fevers, chills, no weakness no fatigue HEAD: Normocephalic, atraumatic.  EYES: Pupils equal, round, reactive to light. Extraocular muscles intact. No scleral icterus.  MOUTH: Moist mucosal membrane. Dentition intact. No abscess noted.  EAR, NOSE, THROAT: Clear without exudates. No external lesions.  NECK: Supple. No thyromegaly. No nodules. No JVD.  PULMONARY: Diffuse coarse rhonchi right sided +wheezes CARDIOVASCULAR: S1 and S2. Regular rate and rhythm. No murmurs, rubs, or gallops. No edema. Pedal pulses 2+ bilaterally.  GASTROINTESTINAL: Soft, nontender, nondistended. No masses. Positive bowel sounds. No hepatosplenomegaly.   MUSCULOSKELETAL: No swelling, clubbing, or edema. Range of motion full in all extremities.  NEUROLOGIC: Cranial nerves II through XII are intact. No gross focal neurological deficits. Sensation intact. Reflexes intact.  SKIN: No ulceration, lesions, rashes, or cyanosis. Skin warm and dry. Turgor intact.  PSYCHIATRIC: Mood, affect within normal limits. The patient is awake, alert and oriented x 3. Insight, judgment intact.       IMAGING    DG Chest 2 View  Result Date: 12/09/2020 CLINICAL DATA:  Shortness of breath, lower extremity swelling. Chronic cough. EXAM: CHEST - 2 VIEW COMPARISON:  Chest radiograph 04/27/2008 FINDINGS: Small to moderate right pleural effusion with right lower lobe atelectasis. Diffuse hazy interstitial thickening, consistent with pulmonary edema. No pneumothorax. Heart size is difficult to evaluate due to adjacent consolidation but appears enlarged, stable. Mildly tortuous thoracic aorta with calcific atherosclerosis. No acute osseous abnormality. IMPRESSION: Pulmonary edema  with small to moderate right pleural effusion. Stable cardiomegaly. Electronically Signed   By: Ileana Roup M.D.   On: 12/09/2020 15:27   CT HEAD WO CONTRAST (5MM)  Result Date: 12/10/2020 CLINICAL DATA:  Delirium.  Acute mental status change. EXAM: CT HEAD WITHOUT CONTRAST TECHNIQUE: Contiguous axial images were obtained from the base of the skull through the vertex without intravenous contrast. COMPARISON:  None. FINDINGS: The study is mildly motion degraded. Brain: There is no evidence of an acute cortically based infarct, intracranial hemorrhage, mass, midline shift, or extra-axial fluid collection. Hypodensities in the cerebral white matter bilaterally are nonspecific but compatible with mild-to-moderate chronic small vessel ischemic disease. Lacunar infarcts in the left greater than right basal ganglia are chronic in appearance. There are age indeterminate lacunar infarcts in the left corona  radiata and left thalamus. The ventricles and sulci are within normal limits for age. Vascular: Calcified atherosclerosis at the skull base. No hyperdense vessel. Skull: No fracture or suspicious osseous lesion. Sinuses/Orbits: Large mucous retention cyst in the right sphenoid sinus. Clear mastoid air cells. Unremarkable orbits. Other: None. IMPRESSION: 1. No evidence of an acute cortically based infarct or intracranial hemorrhage. 2. Age indeterminate lacunar infarcts in the left corona radiata and left thalamus. 3. Chronic small vessel ischemia with chronic lacunar infarcts in the basal ganglia. Electronically Signed   By: Logan Bores M.D.   On: 12/10/2020 13:26   DG Chest Port 1 View  Result Date: 12/11/2020 CLINICAL DATA:  Worsening leg edema, shortness of breath. History of congestive heart failure. EXAM: PORTABLE CHEST 1 VIEW COMPARISON:  Chest x-rays dated 12/10/2020, 12/09/2020 and 04/27/2008 FINDINGS: Stable cardiomegaly. Prominent interstitial markings are again seen bilaterally. Hazy opacity persists at the RIGHT lung base, likely indicating small RIGHT pleural effusion. No new lung findings. No pneumothorax is seen. IMPRESSION: 1. Stable chest x-ray. Probable small RIGHT pleural effusion. Underlying pneumonia not excluded. 2. Stable cardiomegaly and mild interstitial prominence, likely a chronic mild CHF. Electronically Signed   By: Franki Cabot M.D.   On: 12/11/2020 12:00   DG Chest Port 1 View  Result Date: 12/10/2020 CLINICAL DATA:  The patient is unresponsive. EXAM: PORTABLE CHEST 1 VIEW COMPARISON:  December 09, 2020 FINDINGS: Cardiomegaly is stable. The hila and mediastinum are unremarkable. Layering effusion and underlying opacity in the right base. Increased interstitial markings bilaterally. No other acute abnormalities. IMPRESSION: 1. Right-sided layering pleural effusion with underlying opacity. 2. Cardiomegaly and mild edema. Electronically Signed   By: Dorise Bullion III M.D.   On:  12/10/2020 18:59   ECHOCARDIOGRAM COMPLETE  Result Date: 12/10/2020    ECHOCARDIOGRAM REPORT   Patient Name:   KEAN QUAIN Date of Exam: 12/10/2020 Medical Rec #:  [id number]       Height:       72.0 in Accession #:    [id number]      Weight:       279.9 lb Date of Birth:  [birth year]        BSA:          2.458 m Patient Age:    41 years        BP:           108/81 mmHg Patient Gender: M               HR:           98 bpm. Exam Location:  [facility] Procedure: 2D Echo and Intracardiac Opacification Agent Indications:  CHF I50.21  History:         Patient has no prior history of Echocardiogram examinations.  Sonographer:     Kathlen Brunswick RDCS Referring Phys:  [place] Diagnosing Phys: Donnelly Angelica  Sonographer Comments: Technically difficult study due to poor echo windows, patient is morbidly obese and suboptimal apical window. Image acquisition challenging due to patient body habitus. IMPRESSIONS  1. Left ventricular ejection fraction, by estimation, is 50 to 55%. The left ventricle has low normal function. The left ventricle has no regional wall motion abnormalities. There is mild left ventricular hypertrophy. Left ventricular diastolic parameters are indeterminate.  2. Right ventricular systolic function is low normal. The right ventricular size is mildly enlarged.  3. Right atrial size was moderately dilated.  4. The mitral valve is normal in structure. Trivial mitral valve regurgitation. No evidence of mitral stenosis.  5. The aortic valve is normal in structure. Aortic valve regurgitation is not visualized. Mild aortic valve sclerosis is present, with no evidence of aortic valve stenosis.  6. The inferior vena cava is dilated in size with <50% respiratory variability, suggesting right atrial pressure of 15 mmHg. FINDINGS  Left Ventricle: Left ventricular ejection fraction, by estimation, is 50 to 55%. The left ventricle has low normal function. The left ventricle has no regional wall motion  abnormalities. Definity contrast agent was given IV to delineate the left ventricular endocardial borders. The left ventricular internal cavity size was normal in size. There is mild left ventricular hypertrophy. Left ventricular diastolic parameters are indeterminate. Right Ventricle: The right ventricular size is mildly enlarged. Right vetricular wall thickness was not well visualized. Right ventricular systolic function is low normal. Left Atrium: Left atrial size was normal in size. Right Atrium: Right atrial size was moderately dilated. Pericardium: There is no evidence of pericardial effusion. Mitral Valve: The mitral valve is normal in structure. Trivial mitral valve regurgitation. No evidence of mitral valve stenosis. Tricuspid Valve: The tricuspid valve is normal in structure. Tricuspid valve regurgitation is trivial. Aortic Valve: The aortic valve is normal in structure. Aortic valve regurgitation is not visualized. Mild aortic valve sclerosis is present, with no evidence of aortic valve stenosis. Aortic valve peak gradient measures 5.9 mmHg. Pulmonic Valve: The pulmonic valve was not well visualized. Pulmonic valve regurgitation is trivial. No evidence of pulmonic stenosis. Aorta: The aortic root is normal in size and structure. Venous: The inferior vena cava is dilated in size with less than 50% respiratory variability, suggesting right atrial pressure of 15 mmHg. IAS/Shunts: The interatrial septum was not assessed.  LEFT VENTRICLE PLAX 2D LVIDd:         5.20 cm LVIDs:         3.70 cm LV PW:         1.40 cm LV IVS:        1.20 cm LVOT diam:     2.20 cm LV SV:         55 LV SV Index:   22 LVOT Area:     3.80 cm  RIGHT VENTRICLE RV Basal diam:  5.20 cm RV S prime:     13.60 cm/s TAPSE (M-mode): 1.6 cm LEFT ATRIUM             Index       RIGHT ATRIUM           Index LA diam:        3.60 cm 1.46 cm/m  RA Area:     27.80  cm LA Vol (A2C):   66.2 ml 26.94 ml/m RA Volume:   101.00 ml 41.09 ml/m LA Vol  (A4C):   67.8 ml 27.59 ml/m LA Biplane Vol: 67.0 ml 27.26 ml/m  AORTIC VALVE                PULMONIC VALVE AV Area (Vmax): 1.53 cm    PV Vmax:       1.02 m/s AV Vmax:        121.00 cm/s PV Peak grad:  4.2 mmHg AV Peak Grad:   5.9 mmHg LVOT Vmax:      48.60 cm/s LVOT Vmean:     51.400 cm/s LVOT VTI:       0.144 m  AORTA Ao Root diam: 3.30 cm Ao Asc diam:  3.50 cm MITRAL VALVE MV Area (PHT): 4.93 cm     SHUNTS MV Decel Time: 154 msec     Systemic VTI:  0.14 m MV E velocity: 143.00 cm/s  Systemic Diam: 2.20 cm Donnelly Angelica Electronically signed by Donnelly Angelica Signature Date/Time: 12/10/2020/3:19:42 PM    Final          ASSESSMENT/PLAN   Acute on chronic hypoxemic hypercapnic respiratory faiure -Patient reports improvement clinically  -He had pleural effusion which is improved on most recent CXR -he has CHF with CKD and chronic anasarca -he reports intolerance to BIPAP and this may be hindering his progress with pulmonary edema - He should work with RT to improve BIPAP interface leakage and compliance.  I have reviewed issues with BIPAP and discussed with RT and they will assist with compliance and interface refitting tonight  -patient Should have BIPAP nightly while on lasix gtt      Thank you for allowing me to participate in the care of this patient.  Total face to face encounter time for this patient visit was 45 min. >50% of the time was  spent in counseling and coordination of care.   Patient/Family are satisfied with care plan and all questions have been answered.  This document was prepared using Dragon voice recognition software and may include unintentional dictation errors.     Ottie Glazier, M.D.  Division of [place]

## 2020-12-14 NOTE — Consult Note (Signed)
[place] for warfarin Indication: atrial fibrillation   Patient Measurements: Height: 6' (182.9 cm) Weight: 121.6 kg (268 lb) IBW/kg (Calculated) : 77.6   Vital Signs: Temp: 98.8 F (37.1 C) (08/31 0743) Temp Source: Oral (08/31 0743) BP: 134/73 (08/31 0743) Pulse Rate: 110 (08/31 0743)  Labs: Recent Labs    12/12/20 0609 12/13/20 0554 12/14/20 0436  HGB 9.3* 9.7*  --   HCT 30.5* 30.9*  --   PLT 109* 108*  --   LABPROT 38.4* 26.4* 24.2*  INR 3.9* 2.4* 2.2*  CREATININE 2.97* 2.48*  --      Estimated Creatinine Clearance: 38.4 mL/min (A) (by C-G formula based on SCr of 2.48 mg/dL (H)).   Medical History: Past Medical History:  Diagnosis Date   A-fib (HCC)    CHF (congestive heart failure) (HCC)    Coronary artery disease    Hypertension    Renal disorder     Medications:  Chronic Warfarin regimen: 4 mg daily (TWD = 28 mg)  Assessment: 70 y.o. male with medical history paroxysmal atrial fibrillation stable on chronic warfarin regimen as above. Presented to the ER with progressive shortness of breath lower extremity swelling, orthopnea and PND over the last week.    INR supratherapeutic (3.6) on admission. No new outpatient medication identified (patient not taking prednisone -- verified with patient/wife  Date   INR   Warfarin Dose  8/25   --   4 mg (PTA) 8/26   3.6   Hold  8/27   3.6   Hold 8/28    4.8   Hold 8/29   3.9   HOLD 8/30   2.4   2 mg 8/31   2.2   3 mg  HGB stable since admission, Scr worsening 1.45--->2.97, slightly improved overnight  DDIs: levofloxacin  Goal of Therapy:  INR 2-3 Monitor platelets by anticoagulation protocol: Yes   Plan:  INR is now therapeutic x 2 Continue warfarin at 3/4 home dose due to DDI (waning) INR daily   Lu Duffel, PharmD, BCPS Clinical Pharmacist   12/14/2020,8:35 AM

## 2020-12-15 DIAGNOSIS — I480 Paroxysmal atrial fibrillation: Secondary | ICD-10-CM

## 2020-12-15 LAB — BASIC METABOLIC PANEL
Anion gap: 6 (ref 5–15)
BUN: 71 mg/dL — ABNORMAL HIGH (ref 8–23)
CO2: 37 mmol/L — ABNORMAL HIGH (ref 22–32)
Calcium: 9 mg/dL (ref 8.9–10.3)
Chloride: 97 mmol/L — ABNORMAL LOW (ref 98–111)
Creatinine, Ser: 1.84 mg/dL — ABNORMAL HIGH (ref 0.61–1.24)
GFR, Estimated: 39 mL/min — ABNORMAL LOW (ref >60)
Glucose, Bld: 217 mg/dL — ABNORMAL HIGH (ref 70–99)
Potassium: 3.4 mmol/L — ABNORMAL LOW (ref 3.5–5.1)
Sodium: 140 mmol/L (ref 135–145)

## 2020-12-15 LAB — MAGNESIUM: Magnesium: 2.3 mg/dL (ref 1.7–2.4)

## 2020-12-15 LAB — GLUCOSE, CAPILLARY
Glucose-Capillary: 216 mg/dL — ABNORMAL HIGH (ref 70–99)
Glucose-Capillary: 225 mg/dL — ABNORMAL HIGH (ref 70–99)
Glucose-Capillary: 279 mg/dL — ABNORMAL HIGH (ref 70–99)
Glucose-Capillary: 211 mg/dL — ABNORMAL HIGH (ref 70–99)
Glucose-Capillary: 237 mg/dL — ABNORMAL HIGH (ref 70–99)

## 2020-12-15 LAB — PROTIME-INR
INR: 2.5 — ABNORMAL HIGH (ref 0.8–1.2)
Prothrombin Time: 26.8 seconds — ABNORMAL HIGH (ref 11.4–15.2)

## 2020-12-15 MED ORDER — WARFARIN SODIUM 3 MG PO TABS
3.0000 mg | ORAL_TABLET | Freq: Once | ORAL | Status: AC
  Administered 2020-12-15: 3 mg via ORAL
  Filled 2020-12-15: qty 1

## 2020-12-15 MED ORDER — POTASSIUM CHLORIDE CRYS ER 20 MEQ PO TBCR
40.0000 meq | EXTENDED_RELEASE_TABLET | Freq: Once | ORAL | Status: AC
  Administered 2020-12-15: 40 meq via ORAL
  Filled 2020-12-15: qty 2

## 2020-12-15 MED ORDER — CARVEDILOL 12.5 MG PO TABS
12.5000 mg | ORAL_TABLET | Freq: Once | ORAL | Status: AC
  Administered 2020-12-15: 12.5 mg via ORAL
  Filled 2020-12-15: qty 1

## 2020-12-15 MED ORDER — CARVEDILOL 25 MG PO TABS
25.0000 mg | ORAL_TABLET | Freq: Two times a day (BID) | ORAL | Status: DC
  Administered 2020-12-15 – 2020-12-16 (×2): 25 mg via ORAL
  Filled 2020-12-15 (×2): qty 1

## 2020-12-15 NOTE — Consult Note (Addendum)
ANTICOAGULATION CONSULT NOTE  Pharmacy Consult for warfarin Indication: atrial fibrillation   Patient Measurements: Height: 6' (182.9 cm) Weight: 122.2 kg (269 lb 6.4 oz) IBW/kg (Calculated) : 77.6   Vital Signs: Temp: 98.6 F (37 C) (09/01 0730) Temp Source: Oral (09/01 0730) BP: 130/74 (09/01 0730) Pulse Rate: 86 (09/01 0730)  Labs: Recent Labs    12/13/20 0554 12/14/20 0436 12/15/20 0448  HGB 9.7*  --   --   HCT 30.9*  --   --   PLT 108*  --   --   LABPROT 26.4* 24.2* 26.8*  INR 2.4* 2.2* 2.5*  CREATININE 2.48*  --  1.84*     Estimated Creatinine Clearance: 51.8 mL/min (A) (by C-G formula based on SCr of 1.84 mg/dL (H)).   Medical History: Past Medical History:  Diagnosis Date   A-fib (HCC)    CHF (congestive heart failure) (HCC)    Coronary artery disease    Hypertension    Renal disorder     Medications:  Chronic Warfarin regimen: 4 mg daily (TWD = 28 mg)  Assessment: 70 y.o. male with medical history paroxysmal atrial fibrillation stable on chronic warfarin regimen as above. Presented to the ER with progressive shortness of breath lower extremity swelling, orthopnea and PND over the last week.    INR supratherapeutic (3.6) on admission. No new outpatient medication identified (patient not taking prednisone -- verified with patient/wife  Date   INR   Warfarin Dose  8/25   --   4 mg (PTA) 8/26   3.6   Hold  8/27   3.6   Hold 8/28    4.8   Hold 8/29   3.9   HOLD 8/30   2.4   2 mg 8/31   2.2   3 mg 9/01   2.5   3 mg  HGB stable since admission, Scr improving 2.97>>>1.84 (baseline ~ 1.4/1.5) DDIs: levofloxacin  Goal of Therapy:  INR 2-3 Monitor platelets by anticoagulation protocol: Yes   Plan:  INR is now therapeutic x 3 Continue warfarin at 3/4 home dose due to DDI (waning) INR daily  CBC with am labs As patient approaches discharge consider lowering home dose   Lu Duffel, PharmD, BCPS Clinical Pharmacist   12/15/2020,7:43  AM

## 2020-12-15 NOTE — Progress Notes (Signed)
Inpatient Diabetes Program Recommendations  AACE/ADA: New Consensus Statement on Inpatient Glycemic Control   Target Ranges:  Prepandial:   less than 140 mg/dL      Peak postprandial:   less than 180 mg/dL (1-2 hours)      Critically ill patients:  140 - 180 mg/dL   Results for CAYNE, KLESS (MRN [id number]) as of 12/15/2020 08:48  Ref. Range 12/14/2020 07:43 12/14/2020 11:19 12/14/2020 16:09 12/14/2020 21:28 12/15/2020 07:32  Glucose-Capillary Latest Ref Range: 70 - 99 mg/dL 267 (H) 315 (H) 204 (H) 371 (H) 216 (H)    Review of Glycemic Control  Diabetes history: DM2 Outpatient Diabetes medications: None Current orders for Inpatient glycemic control: Novolog 0-15 units TID with meals, Novolog 0-5 units QHS; Prednisone 40 mg QAM   Inpatient Diabetes Program Recommendations:     Insulin: Noted steroids decreased from Solumedrol to Prednisone.  If steroids are continued as currently ordered, please consider ordering Novolog 3 units TID with meals for meal coverage if patient eats at least 50% of meals.   Thanks, Barnie Alderman, RN, MSN, CDE Diabetes Coordinator Inpatient Diabetes Program [phone] (Team Pager from 8am to 5pm)

## 2020-12-15 NOTE — Progress Notes (Signed)
Physical Therapy Treatment Patient Details Name: Timothy Chavez MRN: [id number] DOB: 1950/06/19 Today's Date: 12/15/2020    History of Present Illness Patient is a 70 y.o male admitted with 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 failure secondary to acute systolic CHF exacerbation, right sided pleural effusion, and possible pneumonia. Past medical history of CHF, CAD, CKD, HTN, GERD, diabetes.    PT Comments    Pt received seated in recliner upon arrival to room.  Pt agreeable to therapy.  Pt performed well with mobility and requested to walk several laps around the nursing station, however noted MD wanted to monitor O2 saturations without O2.  Pt was able to ambulate around the nursing station on room air with 90% O2, on the first lap, but fell to 88% on the second.  Pt unable to recover with pursed lip technique and recommended to utilize 1L of O2 and pt's O2 saturations remained in the mid 90's.  Pt then returned to the chair and was left with all needs met.  Current discharge plans to home with HHPT remain appropriate at this time.  Pt will continue to benefit from skilled therapy in order to address deficits listed below.    Follow Up Recommendations  Home health PT;Supervision - Intermittent     Equipment Recommendations  None recommended by PT (patient has RW, shower chair, and ramp in place at home)    Recommendations for Other Services       Precautions / Restrictions Precautions Precautions: Fall Restrictions Weight Bearing Restrictions: No    Mobility  Bed Mobility Overal bed mobility: Modified Independent Bed Mobility: Supine to Sit     Supine to sit: Min assist Sit to supine: Supervision   General bed mobility comments: HOB elevated, increased time and effort    Transfers Overall transfer level: Needs assistance Equipment used: Rolling walker (2 wheeled) Transfers: Sit to/from Stand Sit to Stand: Supervision         General transfer comment: SUP  for safety, no steadying required  Ambulation/Gait Ambulation/Gait assistance: Min guard;Supervision Gait Distance (Feet): 960 Feet Assistive device: Rolling walker (2 wheeled) Gait Pattern/deviations: Step-through pattern;Wide base of support     General Gait Details: Pt utilized RW for entirety of session today, with O2 saturations monitored at every lap.   Stairs             Wheelchair Mobility    Modified Rankin (Stroke Patients Only)       Balance Overall balance assessment: Needs assistance Sitting-balance support: No upper extremity supported;Feet supported Sitting balance-Leahy Scale: Good Sitting balance - Comments: no LOB sitting EOB   Standing balance support: During functional activity;Bilateral upper extremity supported Standing balance-Leahy Scale: Fair Standing balance comment: able to stand and ambulate without AD, CGA for safety                            Cognition Arousal/Alertness: Awake/alert Behavior During Therapy: [place] for tasks assessed/performed                                   General Comments: patient able to follow all commands. motivated      Exercises      General Comments General comments (skin integrity, edema, etc.): SpO2 on Room Air: 88%, 1L of O2: 95%      Pertinent Vitals/Pain Pain Assessment: No/denies pain    Home Living  Prior Function            PT Goals (current goals can now be found in the care plan section) Acute Rehab PT Goals Patient Stated Goal: to go home PT Goal Formulation: With patient Time For Goal Achievement: 12/26/20 Potential to Achieve Goals: Good Progress towards PT goals: Progressing toward goals    Frequency    Min 2X/week      PT Plan      Co-evaluation              AM-PAC PT "6 Clicks" Mobility   Outcome Measure  Help needed turning from your back to your side while in a flat bed without using bedrails?: A  Little Help needed moving from lying on your back to sitting on the side of a flat bed without using bedrails?: A Little Help needed moving to and from a bed to a chair (including a wheelchair)?: A Little Help needed standing up from a chair using your arms (e.g., wheelchair or bedside chair)?: A Little Help needed to walk in hospital room?: A Little Help needed climbing 3-5 steps with a railing? : A Lot 6 Click Score: 17    End of Session Equipment Utilized During Treatment: Gait belt Activity Tolerance: Patient tolerated treatment well Patient left: in chair;with call bell/phone within reach;with chair alarm set Nurse Communication: Mobility status PT Visit Diagnosis: Unsteadiness on feet (R26.81);Muscle weakness (generalized) (M62.81)     Time: 8241-7530 PT Time Calculation (min) (ACUTE ONLY): 46 min  Charges:  $Gait Training: 38-52 mins                     Gwenlyn Saran, PT, DPT 12/15/20, 4:53 PM    Christie Nottingham 12/15/2020, 4:51 PM

## 2020-12-15 NOTE — Progress Notes (Signed)
SATURATION QUALIFICATIONS: (This note is used to comply with regulatory documentation for home oxygen)  Patient Saturations on Room Air at Rest = 89%  Patient Saturations on Room Air while Ambulating = 82%  Patient Saturations on 2 Liters of oxygen while Ambulating = 91%  Please briefly explain why patient needs home oxygen:

## 2020-12-15 NOTE — Clinical Social Work Note (Signed)
Timothy Chavez presents with acute on chronic hypoxemic hypercapnic respiratory failure secondary to COPD.  The use of the NIV will treat patients high PC02 levels (59 on 12/14/20 with elevated bicarbonate of 38.3) and can reduce risk of exacerbations and future hospitalizations when used at night and during the day.  All alternate devices [phone] and [id number]) have been proven ineffective to provide essential volume control necessary to maintain acceptable CO2 levels. An NIV with AVAPS AE is necessary to prevent patient harm.  Interruption or failure to provide NIV would quickly lead to exacerbation of the patients condition, hospital admission, and likely harm to the patient. Continued use is preferred.  Patient is able to maintain airway and clear secretions.

## 2020-12-15 NOTE — Progress Notes (Addendum)
Progress Note    Timothy Chavez  [id number] DOB: 12/04/1950  DOA: 12/09/2020 PCP: Idelle Crouch, MD      Brief Narrative:    Medical records reviewed and are as summarized below:  Timothy Chavez is a 70 y.o. male with medical history significant of systolic dysfunction CHF with EF of 45% from echocardiogram on May 10, 2020, paroxysmal atrial fibrillation, coronary artery disease, chronic kidney disease stage III, essential hypertension, GERD, and diabetes type 2 who presented to the ER with progressive shortness of breath lower extremity swelling, orthopnea and PND.     Assessment/Plan:   Principal Problem:   Acute on chronic combined systolic and diastolic CHF, NYHA class 3 (HCC) Active Problems:   Benign essential hypertension   CAD (coronary artery disease), native coronary artery   Chronic kidney disease, stage III (moderate) (HCC)   Chronic systolic heart failure (HCC)   Current use of long term anticoagulation   GERD (gastroesophageal reflux disease)   Hyperlipidemia   Hypertension   PAF (paroxysmal atrial fibrillation) (HCC)   Secondary hyperparathyroidism of renal origin ([place])   Type II diabetes mellitus with complication (HCC)   Acute on chronic congestive heart failure (HCC)   Acute on chronic respiratory failure with hypoxia (HCC)     Body mass index is 36.54 kg/m.  (Morbid obesity)   Acute on chronic diastolic and systolic, cardiogenic shock: Continue IV Lasix infusion.  S/p treatment with IV Levophed infusion.  Monitor BMP, daily weight and urine output.  2D echo showed EF estimated at 50 to 55% (improved from 40 to 45%) enalapril has been held because of hypotension.   AKI on CKD stage IIIa: Creatinine is improving.  Continue to monitor BMP.  Follow-up with nephrologist.  Hyperkalemia: Improved Hypokalemia: Replete potassium  COPD exacerbation: Discontinue prednisone (he has had treatment with 6 days of steroids).  Continue  bronchodilators  Acute on chronic hypoxic and hypercapnic respiratory failure:  Oxygen saturation on room air at rest was 89% and 82% on room air while ambulating.  Oxygen saturation was 91% on 2 L/min oxygen.  He will need home oxygen.  He will likely benefit from BiPAP (trilogy machine) at home.  Follow-up with case manager and pulmonologist to assist with securing trilogy machine for home use.  ABG showed pH 7.42, PCO2 59, PO2 68 bicarb 38.3 on 2 L/min oxygen.    Paroxysmal atrial fibrillation with RVR: Heart rate was between 120s and 150s overnight.  Continue carvedilol but increase dose to 25 mg twice daily (home dose).  Type 2 diabetes mellitus with hyperglycemia: Discontinue insulin glargine after tonight's dose since he will be off of steroids.  Continue NovoLog as needed for hyperglycemia.  Hemoglobin A1c is 6.8.  Other comorbidities include CAD, hypertension, paroxysmal atrial fibrillation on Coumadin     Diet Order             Diet heart healthy/carb modified Room service appropriate? Yes; Fluid consistency: Thin; Fluid restriction: Other (see comments)  Diet effective now                      Consultants: Nephrologist  Procedures: None    Medications:    budesonide (PULMICORT) nebulizer solution  0.5 mg Nebulization BID   calcitRIOL  0.25 mcg Oral Once per day on Mon Wed Fri   carvedilol  25 mg Oral BID WC   Chlorhexidine Gluconate Cloth  6 each Topical Daily   insulin aspart  0-15 Units Subcutaneous TID WC   insulin aspart  0-5 Units Subcutaneous QHS   insulin glargine-yfgn  5 Units Subcutaneous QHS   midodrine  5 mg Oral TID WC   pravastatin  20 mg Oral q1800   sodium chloride flush  3 mL Intravenous Q12H   traZODone  50 mg Oral QHS   warfarin  3 mg Oral ONCE-1600   Warfarin - Pharmacist Dosing Inpatient   Does not apply q1600   Continuous Infusions:  sodium chloride     sodium chloride Stopped (12/11/20 1100)   furosemide (LASIX) 200 mg in  dextrose 5% 100 mL ('2mg'$ /mL) infusion 5 mg/hr (12/15/20 0429)     Anti-infectives (From admission, onward)    Start     Dose/Rate Route Frequency Ordered Stop   12/12/20 2000  levofloxacin (LEVAQUIN) IVPB 750 mg  Status:  Discontinued        750 mg 100 mL/hr over 90 Minutes Intravenous Every 48 hours 12/10/20 2130 12/12/20 0903   12/12/20 1000  levofloxacin (LEVAQUIN) tablet 750 mg        750 mg Oral Every 48 hours 12/12/20 0903 12/14/20 0829   12/10/20 1930  levofloxacin (LEVAQUIN) IVPB 750 mg  Status:  Discontinued        750 mg 100 mL/hr over 90 Minutes Intravenous Every 24 hours 12/10/20 1821 12/10/20 2130              Family Communication/Anticipated D/C date and plan/Code Status   DVT prophylaxis:  warfarin (COUMADIN) tablet 3 mg     Code Status: DNR  Family Communication: None Disposition Plan:    Status is: Inpatient  Remains inpatient appropriate because:IV treatments appropriate due to intensity of illness or inability to take PO  Dispo: The patient is from: Home              Anticipated d/c is to: Home              Patient currently is not medically stable to d/c.   Difficult to place patient No           Subjective:   Interval events noted.  He still has swelling in bilateral legs.  Objective:    Vitals:   12/15/20 1129 12/15/20 1129 12/15/20 1130 12/15/20 1132  BP:  132/78    Pulse:  (!) 110    Resp:  18    Temp:  98.5 F (36.9 C)    TempSrc:      SpO2: (!) 89% 93% (!) 82% 91%  Weight:      Height:       No data found.   Intake/Output Summary (Last 24 hours) at 12/15/2020 1248 Last data filed at 12/15/2020 1104 Gross per 24 hour  Intake 525.35 ml  Output 5650 ml  Net -5124.65 ml   Filed Weights   12/13/20 0500 12/13/20 2214 12/15/20 0444  Weight: 125.5 kg 121.6 kg 122.2 kg    Exam:  GEN: NAD SKIN: Warm and dry EYES: No pallor or icterus ENT: MMM CV: Irregular rate and rhythm, tachycardic PULM: CTA B ABD: soft,  ND, NT, +BS CNS: AAO x 3, non focal EXT: Bilateral leg edema (3+), no erythema or tenderness      Data Reviewed:   I have personally reviewed following labs and imaging studies:  Labs: Labs show the following:   Basic Metabolic Panel: Recent Labs  Lab 12/10/20 2009 12/10/20 2258 12/11/20 0636 12/11/20 2005 12/12/20 [id number] 12/13/20 [id number] 12/14/20 0436 12/15/20 0448  NA 139  --  137  --  134* 138  --  140  K 5.5*   < > 5.3*   < > 4.9 4.2  --  3.4*  CL 100  --  100  --  98 97*  --  97*  CO2 32  --  31  --  32 33*  --  37*  GLUCOSE 122*  --  214*  --  219* 208*  --  217*  BUN 30*  --  42*  --  64* 71*  --  71*  CREATININE 2.53*  --  2.74*  --  2.97* 2.48*  --  1.84*  CALCIUM 8.3*  --  8.5*  --  8.8* 9.0  --  9.0  MG  --   --  2.0  --  2.3 2.1 2.1 2.3  PHOS  --   --  5.2*  --  4.2  --   --   --    < > = values in this interval not displayed.   GFR Estimated Creatinine Clearance: 51.8 mL/min (A) (by C-G formula based on SCr of 1.84 mg/dL (H)). Liver Function Tests: Recent Labs  Lab 12/12/20 0609  AST 15  ALT 10  ALKPHOS 55  BILITOT 0.7  PROT 6.2*  ALBUMIN 2.4*   No results for input(s): LIPASE, AMYLASE in the last 168 hours. No results for input(s): AMMONIA in the last 168 hours. Coagulation profile Recent Labs  Lab 12/11/20 0636 12/12/20 0609 12/13/20 0554 12/14/20 0436 12/15/20 0448  INR 4.8* 3.9* 2.4* 2.2* 2.5*    CBC: Recent Labs  Lab 12/09/20 1445 12/10/20 2009 12/11/20 0636 12/12/20 0609 12/13/20 0554  WBC 4.7 10.1 10.8* 7.5 7.3  NEUTROABS  --   --   --   --  6.9  HGB 10.0* 9.9* 10.1* 9.3* 9.7*  HCT 32.6* 34.4* 33.9* 30.5* 30.9*  MCV 90.3 95.6 93.4 89.7 89.3  PLT 122* 104* 108* 109* 108*   Cardiac Enzymes: No results for input(s): CKTOTAL, CKMB, CKMBINDEX, TROPONINI in the last 168 hours. BNP (last 3 results) No results for input(s): PROBNP in the last 8760 hours. CBG: Recent Labs  Lab 12/14/20 1119 12/14/20 1609 12/14/20 2128  12/15/20 0732 12/15/20 1128  GLUCAP 315* 204* 371* 216* 225*   D-Dimer: No results for input(s): DDIMER in the last 72 hours. Hgb A1c: No results for input(s): HGBA1C in the last 72 hours. Lipid Profile: No results for input(s): CHOL, HDL, LDLCALC, TRIG, CHOLHDL, LDLDIRECT in the last 72 hours. Thyroid function studies: No results for input(s): TSH, T4TOTAL, T3FREE, THYROIDAB in the last 72 hours.  Invalid input(s): FREET3 Anemia work up: No results for input(s): VITAMINB12, FOLATE, FERRITIN, TIBC, IRON, RETICCTPCT in the last 72 hours. Sepsis Labs: Recent Labs  Lab 12/10/20 0427 12/10/20 2009 12/10/20 2258 12/11/20 0636 12/12/20 0609 12/13/20 0554  PROCALCITON 5.34  --   --   --   --   --   WBC  --  10.1  --  10.8* 7.5 7.3  LATICACIDVEN  --  1.7 1.5  --   --   --     Microbiology Recent Results (from the past 240 hour(s))  Resp Panel by RT-PCR (Flu A&B, Covid) Nasopharyngeal Swab     Status: None   Collection Time: 12/09/20  5:11 PM   Specimen: Nasopharyngeal Swab; Nasopharyngeal(NP) swabs in vial transport medium  Result Value Ref Range Status   SARS Coronavirus 2 by RT PCR NEGATIVE NEGATIVE Final  Comment: (NOTE) SARS-CoV-2 target nucleic acids are NOT DETECTED.  The SARS-CoV-2 RNA is generally detectable in upper respiratory specimens during the acute phase of infection. The lowest concentration of SARS-CoV-2 viral copies this assay can detect is 138 copies/mL. A negative result does not preclude SARS-Cov-2 infection and should not be used as the sole basis for treatment or other patient management decisions. A negative result may occur with  improper specimen collection/handling, submission of specimen other than nasopharyngeal swab, presence of viral mutation(s) within the areas targeted by this assay, and inadequate number of viral copies(<138 copies/mL). A negative result must be combined with clinical observations, patient history, and  epidemiological information. The expected result is Negative.  Fact Sheet for Patients:  EntrepreneurPulse.com.au  Fact Sheet for Healthcare Providers:  IncredibleEmployment.be  This test is no t yet approved or cleared by the [place] FDA and  has been authorized for detection and/or diagnosis of SARS-CoV-2 by FDA under an Emergency Use Authorization (EUA). This EUA will remain  in effect (meaning this test can be used) for the duration of the COVID-19 declaration under Section 564(b)(1) of the Act, 21 U.S.C.section 360bbb-3(b)(1), unless the authorization is terminated  or revoked sooner.       Influenza A by PCR NEGATIVE NEGATIVE Final   Influenza B by PCR NEGATIVE NEGATIVE Final    Comment: (NOTE) The Xpert Xpress SARS-CoV-2/FLU/RSV plus assay is intended as an aid in the diagnosis of influenza from Nasopharyngeal swab specimens and should not be used as a sole basis for treatment. Nasal washings and aspirates are unacceptable for Xpert Xpress SARS-CoV-2/FLU/RSV testing.  Fact Sheet for Patients: EntrepreneurPulse.com.au  Fact Sheet for Healthcare Providers: IncredibleEmployment.be  This test is not yet approved or cleared by the [place] FDA and has been authorized for detection and/or diagnosis of SARS-CoV-2 by FDA under an Emergency Use Authorization (EUA). This EUA will remain in effect (meaning this test can be used) for the duration of the COVID-19 declaration under Section 564(b)(1) of the Act, 21 U.S.C. section 360bbb-3(b)(1), unless the authorization is terminated or revoked.  Performed at [facility], [address], [place], [place] [address]   Urine Culture     Status: None   Collection Time: 12/10/20  6:14 PM   Specimen: Urine, Random  Result Value Ref Range Status   Specimen Description   Final    URINE, RANDOM Performed at [facility], [address], [place], [place] [address]    Special Requests   Final    NONE Performed at [facility], [address], [place], [place] [address]    Culture   Final    NO GROWTH Performed at [facility], [place] [address] [place], [place] [address]    Report Status 12/12/2020 FINAL  Final  MRSA Next Gen by PCR, Nasal     Status: None   Collection Time: 12/10/20  6:22 PM   Specimen: Nasal Mucosa; Nasal Swab  Result Value Ref Range Status   MRSA by PCR Next Gen NOT DETECTED NOT DETECTED Final    Comment: (NOTE) The GeneXpert MRSA Assay (FDA approved for NASAL specimens only), is one component of a comprehensive MRSA colonization surveillance program. It is not intended to diagnose MRSA infection nor to guide or monitor treatment for MRSA infections. Test performance is not FDA approved in patients less than 3 years old. Performed at [facility], [address] [place], [place] [address]     Procedures and  diagnostic studies:  No results found.             LOS: 6 days   Linsey Arteaga  [facility]   Pager on [email or web address]. If 7PM-7AM, please contact night-coverage at [email or web address]     12/15/2020, 12:48 PM

## 2020-12-15 NOTE — Progress Notes (Signed)
Pulmonary Medicine          Date: 12/15/2020,   MRN# [id number] Timothy Chavez 12/22/1950     AdmissionWeight: 127 kg                 CurrentWeight: 122.2 kg   Referring physician: Dr Mal Misty   CHIEF COMPLAINT:   Acute hypoxemic respiratory failure   HISTORY OF PRESENT ILLNESS   This is a 70 year old male with a history of significant comorbid history as below including atrial fibrillation congestive heart failure coronary disease chronic renal failure and essential hypertension came in with acute hypoxemic respiratory failure.  He was placed on supplemental oxygen however continued to be hypoxemic.  Blood work on initial admission showed elevation of BNP with mild anemia with a hemoglobin of 10 and elevated INR at 3.6.  He was negative for COVID and influenza.  He had chest x-ray which showed interstitial edema and moderate right pleural effusion.  He was subsequently noted to be lethargic and hypotensive and was transferred to stepdown unit transiently required Levophed drip as well as BiPAP.  ABG was done which showed hypercapnia with hypoxemia.  He shares BIPAP is disruptive to his sleep and is keeping him awake.   12/15/20- patient with substantial improvement clinically.  I anticipate need for positive pressure ventilation long term. We discussed home NIV use and I placed order for device qualification.  [facility] staff have been informed and will be working on this.  He is optimized from pulmonary perspective for dc home and should be seen on outpatient in clinic with [facility] pulmonolgy within 1 wk post dc.   PAST MEDICAL HISTORY   Past Medical History:  Diagnosis Date   A-fib [facility])    CHF (congestive heart failure) ([place])    Coronary artery disease    Hypertension    Renal disorder      SURGICAL HISTORY   History reviewed. No pertinent surgical history.   FAMILY HISTORY   History reviewed. No pertinent family history.   SOCIAL HISTORY   Social History    Tobacco Use   Smoking status: Never    Passive exposure: Never   Smokeless tobacco: Never  Substance Use Topics   Alcohol use: Not Currently   Drug use: Not Currently     MEDICATIONS    Home Medication:    Current Medication:  Current Facility-Administered Medications:    0.9 %  sodium chloride infusion, 250 mL, Intravenous, PRN, Jonelle Sidle, Mohammad L, MD   0.9 %  sodium chloride infusion, 250 mL, Intravenous, Continuous, Dorothe Pea, RPH, Stopped at 12/11/20 1100   acetaminophen (TYLENOL) tablet 650 mg, 650 mg, Oral, Q4H PRN, Jonelle Sidle, Mohammad L, MD   budesonide (PULMICORT) nebulizer solution 0.5 mg, 0.5 mg, Nebulization, BID, Graves, Dana E, NP, 0.5 mg at 12/15/20 [id number]   calcitRIOL (ROCALTROL) capsule 0.25 mcg, 0.25 mcg, Oral, Once per day on Mon Wed Fri, Garba, Mohammad L, MD, 0.25 mcg at 12/14/20 1126   carvedilol (COREG) tablet 25 mg, 25 mg, Oral, BID WC, Jennye Boroughs, MD   Chlorhexidine Gluconate Cloth 2 % PADS 6 each, 6 each, Topical, Daily, Fritzi Mandes, MD, 6 each at 12/15/20 1032   furosemide (LASIX) 200 mg in dextrose 5 % 100 mL (2 mg/mL) infusion, 5 mg/hr, Intravenous, Continuous, Ouma, Bing Neighbors, NP, Last Rate: 2.5 mL/hr at 12/15/20 0429, 5 mg/hr at 12/15/20 0429   insulin aspart (novoLOG) injection 0-15 Units, 0-15 Units, Subcutaneous, TID WC, Gala Romney  L, MD, 5 Units at 12/15/20 0932   insulin aspart (novoLOG) injection 0-5 Units, 0-5 Units, Subcutaneous, QHS, Gala Romney L, MD, 5 Units at 12/14/20 2149   insulin glargine-yfgn (SEMGLEE) injection 5 Units, 5 Units, Subcutaneous, QHS, Jennye Boroughs, MD, 5 Units at 12/14/20 2148   levalbuterol (XOPENEX) nebulizer solution 1.25 mg, 1.25 mg, Nebulization, Q6H PRN, Fritzi Mandes, MD   midodrine (PROAMATINE) tablet 5 mg, 5 mg, Oral, TID WC, Candiss Norse, Har[place], MD, 5 mg at 12/15/20 0934   ondansetron (ZOFRAN) injection 4 mg, 4 mg, Intravenous, Q6H PRN, Elwyn Reach, MD   pravastatin (PRAVACHOL) tablet 20  mg, 20 mg, Oral, q1800, Gala Romney L, MD, 20 mg at 12/14/20 1628   predniSONE (DELTASONE) tablet 40 mg, 40 mg, Oral, Q breakfast, Jennye Boroughs, MD, 40 mg at 12/15/20 [id number]   sodium chloride flush (NS) 0.9 % injection 3 mL, 3 mL, Intravenous, Q12H, Jonelle Sidle, Mohammad L, MD, 3 mL at 12/15/20 0934   sodium chloride flush (NS) 0.9 % injection 3 mL, 3 mL, Intravenous, PRN, Elwyn Reach, MD   traZODone (DESYREL) tablet 50 mg, 50 mg, Oral, QHS, Sharion Settler, NP, 50 mg at 12/14/20 2150   warfarin (COUMADIN) tablet 3 mg, 3 mg, Oral, ONCE-1600, Shanlever, Pierce Crane, RPH   Warfarin - Pharmacist Dosing Inpatient, , Does not apply, q1600, Dorothe Pea, RPH    ALLERGIES   Penicillin v and Penicillins     REVIEW OF SYSTEMS    Review of Systems:  Gen:  Denies  fever, sweats, chills weigh loss  HEENT: Denies blurred vision, double vision, ear pain, eye pain, hearing loss, nose bleeds, sore throat Cardiac:  No dizziness, chest pain or heaviness, chest tightness,edema Resp:   Denies cough or sputum porduction, shortness of breath,wheezing, hemoptysis,  Gi: Denies swallowing difficulty, stomach pain, nausea or vomiting, diarrhea, constipation, bowel incontinence Gu:  Denies bladder incontinence, burning urine Ext:   Denies Joint pain, stiffness or swelling Skin: Denies  skin rash, easy bruising or bleeding or hives Endoc:  Denies polyuria, polydipsia , polyphagia or weight change Psych:   Denies depression, insomnia or hallucinations   Other:  All other systems negative   VS: BP 132/78 (BP Location: Left Arm)   Pulse (!) 110   Temp 98.5 F (36.9 C)   Resp 18   Ht 6' (1.829 m)   Wt 122.2 kg   SpO2 91%   BMI 36.54 kg/m      PHYSICAL EXAM    GENERAL:NAD, no fevers, chills, no weakness no fatigue HEAD: Normocephalic, atraumatic.  EYES: Pupils equal, round, reactive to light. Extraocular muscles intact. No scleral icterus.  MOUTH: Moist mucosal membrane. Dentition  intact. No abscess noted.  EAR, NOSE, THROAT: Clear without exudates. No external lesions.  NECK: Supple. No thyromegaly. No nodules. No JVD.  PULMONARY: mild crackles bilaterally CARDIOVASCULAR: S1 and S2. Regular rate and rhythm. No murmurs, rubs, or gallops. No edema. Pedal pulses 2+ bilaterally.  GASTROINTESTINAL: Soft, nontender, nondistended. No masses. Positive bowel sounds. No hepatosplenomegaly.  MUSCULOSKELETAL: No swelling, clubbing, or edema. Range of motion full in all extremities.  NEUROLOGIC: Cranial nerves II through XII are intact. No gross focal neurological deficits. Sensation intact. Reflexes intact.  SKIN: No ulceration, lesions, rashes, or cyanosis. Skin warm and dry. Turgor intact.  PSYCHIATRIC: Mood, affect within normal limits. The patient is awake, alert and oriented x 3. Insight, judgment intact.       IMAGING    DG Chest 2 View  Result  Date: 12/09/2020 CLINICAL DATA:  Shortness of breath, lower extremity swelling. Chronic cough. EXAM: CHEST - 2 VIEW COMPARISON:  Chest radiograph 04/27/2008 FINDINGS: Small to moderate right pleural effusion with right lower lobe atelectasis. Diffuse hazy interstitial thickening, consistent with pulmonary edema. No pneumothorax. Heart size is difficult to evaluate due to adjacent consolidation but appears enlarged, stable. Mildly tortuous thoracic aorta with calcific atherosclerosis. No acute osseous abnormality. IMPRESSION: Pulmonary edema with small to moderate right pleural effusion. Stable cardiomegaly. Electronically Signed   By: Ileana Roup M.D.   On: 12/09/2020 15:27   CT HEAD WO CONTRAST (5MM)  Result Date: 12/10/2020 CLINICAL DATA:  Delirium.  Acute mental status change. EXAM: CT HEAD WITHOUT CONTRAST TECHNIQUE: Contiguous axial images were obtained from the base of the skull through the vertex without intravenous contrast. COMPARISON:  None. FINDINGS: The study is mildly motion degraded. Brain: There is no evidence of an  acute cortically based infarct, intracranial hemorrhage, mass, midline shift, or extra-axial fluid collection. Hypodensities in the cerebral white matter bilaterally are nonspecific but compatible with mild-to-moderate chronic small vessel ischemic disease. Lacunar infarcts in the left greater than right basal ganglia are chronic in appearance. There are age indeterminate lacunar infarcts in the left corona radiata and left thalamus. The ventricles and sulci are within normal limits for age. Vascular: Calcified atherosclerosis at the skull base. No hyperdense vessel. Skull: No fracture or suspicious osseous lesion. Sinuses/Orbits: Large mucous retention cyst in the right sphenoid sinus. Clear mastoid air cells. Unremarkable orbits. Other: None. IMPRESSION: 1. No evidence of an acute cortically based infarct or intracranial hemorrhage. 2. Age indeterminate lacunar infarcts in the left corona radiata and left thalamus. 3. Chronic small vessel ischemia with chronic lacunar infarcts in the basal ganglia. Electronically Signed   By: Logan Bores M.D.   On: 12/10/2020 13:26   DG Chest Port 1 View  Result Date: 12/11/2020 CLINICAL DATA:  Worsening leg edema, shortness of breath. History of congestive heart failure. EXAM: PORTABLE CHEST 1 VIEW COMPARISON:  Chest x-rays dated 12/10/2020, 12/09/2020 and 04/27/2008 FINDINGS: Stable cardiomegaly. Prominent interstitial markings are again seen bilaterally. Hazy opacity persists at the RIGHT lung base, likely indicating small RIGHT pleural effusion. No new lung findings. No pneumothorax is seen. IMPRESSION: 1. Stable chest x-ray. Probable small RIGHT pleural effusion. Underlying pneumonia not excluded. 2. Stable cardiomegaly and mild interstitial prominence, likely a chronic mild CHF. Electronically Signed   By: Franki Cabot M.D.   On: 12/11/2020 12:00   DG Chest Port 1 View  Result Date: 12/10/2020 CLINICAL DATA:  The patient is unresponsive. EXAM: PORTABLE CHEST 1 VIEW  COMPARISON:  December 09, 2020 FINDINGS: Cardiomegaly is stable. The hila and mediastinum are unremarkable. Layering effusion and underlying opacity in the right base. Increased interstitial markings bilaterally. No other acute abnormalities. IMPRESSION: 1. Right-sided layering pleural effusion with underlying opacity. 2. Cardiomegaly and mild edema. Electronically Signed   By: Dorise Bullion III M.D.   On: 12/10/2020 18:59   ECHOCARDIOGRAM COMPLETE  Result Date: 12/10/2020    ECHOCARDIOGRAM REPORT   Patient Name:   RIGLEY FALGOUT Date of Exam: 12/10/2020 Medical Rec #:  [id number]       Height:       72.0 in Accession #:    [id number]      Weight:       279.9 lb Date of Birth:  07-07-1950        BSA:          2.458 m  Patient Age:    34 years        BP:           108/81 mmHg Patient Gender: M               HR:           98 bpm. Exam Location:  [facility] Procedure: 2D Echo and Intracardiac Opacification Agent Indications:     CHF I50.21  History:         Patient has no prior history of Echocardiogram examinations.  Sonographer:     Kathlen Brunswick RDCS Referring Phys:  [place] Diagnosing Phys: Donnelly Angelica  Sonographer Comments: Technically difficult study due to poor echo windows, patient is morbidly obese and suboptimal apical window. Image acquisition challenging due to patient body habitus. IMPRESSIONS  1. Left ventricular ejection fraction, by estimation, is 50 to 55%. The left ventricle has low normal function. The left ventricle has no regional wall motion abnormalities. There is mild left ventricular hypertrophy. Left ventricular diastolic parameters are indeterminate.  2. Right ventricular systolic function is low normal. The right ventricular size is mildly enlarged.  3. Right atrial size was moderately dilated.  4. The mitral valve is normal in structure. Trivial mitral valve regurgitation. No evidence of mitral stenosis.  5. The aortic valve is normal in structure. Aortic valve regurgitation is  not visualized. Mild aortic valve sclerosis is present, with no evidence of aortic valve stenosis.  6. The inferior vena cava is dilated in size with <50% respiratory variability, suggesting right atrial pressure of 15 mmHg. FINDINGS  Left Ventricle: Left ventricular ejection fraction, by estimation, is 50 to 55%. The left ventricle has low normal function. The left ventricle has no regional wall motion abnormalities. Definity contrast agent was given IV to delineate the left ventricular endocardial borders. The left ventricular internal cavity size was normal in size. There is mild left ventricular hypertrophy. Left ventricular diastolic parameters are indeterminate. Right Ventricle: The right ventricular size is mildly enlarged. Right vetricular wall thickness was not well visualized. Right ventricular systolic function is low normal. Left Atrium: Left atrial size was normal in size. Right Atrium: Right atrial size was moderately dilated. Pericardium: There is no evidence of pericardial effusion. Mitral Valve: The mitral valve is normal in structure. Trivial mitral valve regurgitation. No evidence of mitral valve stenosis. Tricuspid Valve: The tricuspid valve is normal in structure. Tricuspid valve regurgitation is trivial. Aortic Valve: The aortic valve is normal in structure. Aortic valve regurgitation is not visualized. Mild aortic valve sclerosis is present, with no evidence of aortic valve stenosis. Aortic valve peak gradient measures 5.9 mmHg. Pulmonic Valve: The pulmonic valve was not well visualized. Pulmonic valve regurgitation is trivial. No evidence of pulmonic stenosis. Aorta: The aortic root is normal in size and structure. Venous: The inferior vena cava is dilated in size with less than 50% respiratory variability, suggesting right atrial pressure of 15 mmHg. IAS/Shunts: The interatrial septum was not assessed.  LEFT VENTRICLE PLAX 2D LVIDd:         5.20 cm LVIDs:         3.70 cm LV PW:         1.40  cm LV IVS:        1.20 cm LVOT diam:     2.20 cm LV SV:         55 LV SV Index:   22 LVOT Area:     3.80 cm  RIGHT VENTRICLE  RV Basal diam:  5.20 cm RV S prime:     13.60 cm/s TAPSE (M-mode): 1.6 cm LEFT ATRIUM             Index       RIGHT ATRIUM           Index LA diam:        3.60 cm 1.46 cm/m  RA Area:     27.80 cm LA Vol (A2C):   66.2 ml 26.94 ml/m RA Volume:   101.00 ml 41.09 ml/m LA Vol (A4C):   67.8 ml 27.59 ml/m LA Biplane Vol: 67.0 ml 27.26 ml/m  AORTIC VALVE                PULMONIC VALVE AV Area (Vmax): 1.53 cm    PV Vmax:       1.02 m/s AV Vmax:        121.00 cm/s PV Peak grad:  4.2 mmHg AV Peak Grad:   5.9 mmHg LVOT Vmax:      48.60 cm/s LVOT Vmean:     51.400 cm/s LVOT VTI:       0.144 m  AORTA Ao Root diam: 3.30 cm Ao Asc diam:  3.50 cm MITRAL VALVE MV Area (PHT): 4.93 cm     SHUNTS MV Decel Time: 154 msec     Systemic VTI:  0.14 m MV E velocity: 143.00 cm/s  Systemic Diam: 2.20 cm Donnelly Angelica Electronically signed by Donnelly Angelica Signature Date/Time: 12/10/2020/3:19:42 PM    Final          ASSESSMENT/PLAN   Acute on chronic hypoxemic hypercapnic respiratory faiure -Patient reports improvement clinically  -He had pleural effusion which is improved on most recent CXR -he has CHF with CKD and chronic anasarca -he reports intolerance to BIPAP and this may be hindering his progress with pulmonary edema - He should work with RT to improve BIPAP interface leakage and compliance.  I have reviewed issues with BIPAP and discussed with RT and they will assist with compliance and interface refitting tonight  -patient Should have BIPAP nightly while on lasix gtt  -Home NIV ordered    Thank you for allowing me to participate in the care of this patient.     Patient/Family are satisfied with care plan and all questions have been answered.  This document was prepared using Dragon voice recognition software and may include unintentional dictation errors.     Ottie Glazier, M.D.   Division of [place]

## 2020-12-15 NOTE — Plan of Care (Signed)

## 2020-12-15 NOTE — Progress Notes (Addendum)
[place], [place] 12/15/20  Subjective:   Hospital day # 6 Hospital course: Initially he was admitted for treatment of congestive heart failure.  Patient subsequently became lethargic with worsening shortness of breath and leg edema and is transferred to the intensive care unit  8/29-states he was able to eat a good breakfast.  Denies any nausea or vomiting.  Urine output appears to be increasing.  Serum creatinine is also worsening. Overall patient feels improved although he still has large amount of lower extremity edema all the way up to lower abdomen.  Requiring BiPAP at night.  Levophed drip was discontinued at 5 AM this morning.  8/30-continued on IV furosemide.  Improvement in serum creatinine is noted.  Urine output of over 3600 cc in the last 24 hours Patient is insisting on going home today.  8/31- transferred out of ICU yesterday evening. Remains on Lasix drip. Edema has improved. UOP 4.2L in 24 hours. Would like discharge, but not as insistent as yesterday. Feels better today, tolerating meals  Patient sitting up in bed States he feels much better today Ambulating with therapy Tolerating meals   Renal: 08/31 0701 - 09/01 0700 In: 536 [P.O.:480; I.V.:56] Out: 5200 [Urine:5200] Lab Results  Component Value Date   CREATININE 1.84 (H) 12/15/2020   CREATININE 2.48 (H) 12/13/2020   CREATININE 2.97 (H) 12/12/2020     Objective:  Vital signs in last 24 hours:  Temp:  [97.8 F (36.6 C)-98.8 F (37.1 C)] 98.5 F (36.9 C) (09/01 1129) Pulse Rate:  [86-110] 110 (09/01 1129) Resp:  [16-38] 18 (09/01 1129) BP: (122-134)/(71-79) 132/78 (09/01 1129) SpO2:  [82 %-100 %] 91 % (09/01 1132) Weight:  [122.2 kg] 122.2 kg (09/01 0444)  Weight change: 0.636 kg Filed Weights   12/13/20 0500 12/13/20 2214 12/15/20 0444  Weight: 125.5 kg 121.6 kg 122.2 kg    Intake/Output:    Intake/Output Summary (Last 24 hours) at 12/15/2020 1229 Last data filed  at 12/15/2020 1104 Gross per 24 hour  Intake 525.35 ml  Output 5650 ml  Net -5124.65 ml      Physical Exam: General: No acute distress, laying in the bed  HEENT Anicteric, moist oral mucous membranes  Pulm/lungs Mild bilateral basilar crackles, [place] O2,   CVS/Heart No rub, irregular  Abdomen:  Distended, obese  Extremities: 2 lower extremity edema   Neurologic: Alert, able to follow commands  Skin: Warm, dry          Basic Metabolic Panel:  Recent Labs  Lab 12/10/20 2009 12/10/20 2258 12/11/20 0636 12/11/20 2005 12/12/20 0609 12/13/20 0554 12/14/20 0436 12/15/20 0448  NA 139  --  137  --  134* 138  --  140  K 5.5*   < > 5.3* 5.3* 4.9 4.2  --  3.4*  CL 100  --  100  --  98 97*  --  97*  CO2 32  --  31  --  32 33*  --  37*  GLUCOSE 122*  --  214*  --  219* 208*  --  217*  BUN 30*  --  42*  --  64* 71*  --  71*  CREATININE 2.53*  --  2.74*  --  2.97* 2.48*  --  1.84*  CALCIUM 8.3*  --  8.5*  --  8.8* 9.0  --  9.0  MG  --   --  2.0  --  2.3 2.1 2.1 2.3  PHOS  --   --  5.2*  --  4.2  --   --   --    < > = values in this interval not displayed.      CBC: Recent Labs  Lab 12/09/20 1445 12/10/20 2009 12/11/20 0636 12/12/20 0609 12/13/20 0554  WBC 4.7 10.1 10.8* 7.5 7.3  NEUTROABS  --   --   --   --  6.9  HGB 10.0* 9.9* 10.1* 9.3* 9.7*  HCT 32.6* 34.4* 33.9* 30.5* 30.9*  MCV 90.3 95.6 93.4 89.7 89.3  PLT 122* 104* 108* 109* 108*      No results found for: HEPBSAG, HEPBSAB, HEPBIGM    Microbiology:  Recent Results (from the past 240 hour(s))  Resp Panel by RT-PCR (Flu A&B, Covid) Nasopharyngeal Swab     Status: None   Collection Time: 12/09/20  5:11 PM   Specimen: Nasopharyngeal Swab; Nasopharyngeal(NP) swabs in vial transport medium  Result Value Ref Range Status   SARS Coronavirus 2 by RT PCR NEGATIVE NEGATIVE Final    Comment: (NOTE) SARS-CoV-2 target nucleic acids are NOT DETECTED.  The SARS-CoV-2 RNA is generally detectable in upper  respiratory specimens during the acute phase of infection. The lowest concentration of SARS-CoV-2 viral copies this assay can detect is 138 copies/mL. A negative result does not preclude SARS-Cov-2 infection and should not be used as the sole basis for treatment or other patient management decisions. A negative result may occur with  improper specimen collection/handling, submission of specimen other than nasopharyngeal swab, presence of viral mutation(s) within the areas targeted by this assay, and inadequate number of viral copies(<138 copies/mL). A negative result must be combined with clinical observations, patient history, and epidemiological information. The expected result is Negative.  Fact Sheet for Patients:  EntrepreneurPulse.com.au  Fact Sheet for Healthcare Providers:  IncredibleEmployment.be  This test is no t yet approved or cleared by the [place] FDA and  has been authorized for detection and/or diagnosis of SARS-CoV-2 by FDA under an Emergency Use Authorization (EUA). This EUA will remain  in effect (meaning this test can be used) for the duration of the COVID-19 declaration under Section 564(b)(1) of the Act, 21 U.S.C.section 360bbb-3(b)(1), unless the authorization is terminated  or revoked sooner.       Influenza A by PCR NEGATIVE NEGATIVE Final   Influenza B by PCR NEGATIVE NEGATIVE Final    Comment: (NOTE) The Xpert Xpress SARS-CoV-2/FLU/RSV plus assay is intended as an aid in the diagnosis of influenza from Nasopharyngeal swab specimens and should not be used as a sole basis for treatment. Nasal washings and aspirates are unacceptable for Xpert Xpress SARS-CoV-2/FLU/RSV testing.  Fact Sheet for Patients: EntrepreneurPulse.com.au  Fact Sheet for Healthcare Providers: IncredibleEmployment.be  This test is not yet approved or cleared by the [place] FDA and has been  authorized for detection and/or diagnosis of SARS-CoV-2 by FDA under an Emergency Use Authorization (EUA). This EUA will remain in effect (meaning this test can be used) for the duration of the COVID-19 declaration under Section 564(b)(1) of the Act, 21 U.S.C. section 360bbb-3(b)(1), unless the authorization is terminated or revoked.  Performed at [facility], [address] [place], [place] [address]   Urine Culture     Status: None   Collection Time: 12/10/20  6:14 PM   Specimen: Urine, Random  Result Value Ref Range Status   Specimen Description   Final    URINE, RANDOM Performed at [facility], [address], [place], [place] [address]  Special Requests   Final    NONE Performed at [facility], [address], [place], [place] [address]    Culture   Final    NO GROWTH Performed at [facility], [place] [address], [place], [place] [address]    Report Status 12/12/2020 FINAL  Final  MRSA Next Gen by PCR, Nasal     Status: None   Collection Time: 12/10/20  6:22 PM   Specimen: Nasal Mucosa; Nasal Swab  Result Value Ref Range Status   MRSA by PCR Next Gen NOT DETECTED NOT DETECTED Final    Comment: (NOTE) The GeneXpert MRSA Assay (FDA approved for NASAL specimens only), is one component of a comprehensive MRSA colonization surveillance program. It is not intended to diagnose MRSA infection nor to guide or monitor treatment for MRSA infections. Test performance is not FDA approved in patients less than 48 years old. Performed at [facility], [address] [place], [place] [address]     Coagulation Studies: Recent Labs    12/13/20 0554 12/14/20 0436 12/15/20 0448  LABPROT 26.4* 24.2* 26.8*  INR 2.4* 2.2* 2.5*     Urinalysis: No results for input(s): COLORURINE, LABSPEC, PHURINE, GLUCOSEU, HGBUR, BILIRUBINUR, KETONESUR, PROTEINUR, UROBILINOGEN, NITRITE, LEUKOCYTESUR in the last 72 hours.  Invalid input(s):  APPERANCEUR     Imaging: No results found.   Medications:    sodium chloride     sodium chloride Stopped (12/11/20 1100)   furosemide (LASIX) 200 mg in dextrose 5% 100 mL ('2mg'$ /mL) infusion 5 mg/hr (12/15/20 0429)    budesonide (PULMICORT) nebulizer solution  0.5 mg Nebulization BID   calcitRIOL  0.25 mcg Oral Once per day on Mon Wed Fri   carvedilol  25 mg Oral BID WC   Chlorhexidine Gluconate Cloth  6 each Topical Daily   insulin aspart  0-15 Units Subcutaneous TID WC   insulin aspart  0-5 Units Subcutaneous QHS   insulin glargine-yfgn  5 Units Subcutaneous QHS   midodrine  5 mg Oral TID WC   pravastatin  20 mg Oral q1800   predniSONE  40 mg Oral Q breakfast   sodium chloride flush  3 mL Intravenous Q12H   traZODone  50 mg Oral QHS   warfarin  3 mg Oral ONCE-1600   Warfarin - Pharmacist Dosing Inpatient   Does not apply q1600   sodium chloride, acetaminophen, levalbuterol, ondansetron (ZOFRAN) IV, sodium chloride flush  Assessment/ Plan:  70 y.o. male with   hypertension, coronary artery disease, congestive heart failure, chronic kidney disease  admitted on 12/09/2020 for Acute on chronic combined systolic and diastolic CHF, NYHA class 3 (HCC) [I50.43] Acute on chronic respiratory failure with hypoxia (HCC) [J96.21] Acute on chronic congestive heart failure, unspecified heart failure type ([place]) [I50.9]  Patient with hypertension, coronary artery disease, CHF, CKD, anemia admitted with worsening shortness of breath with exertion, worsening lower extremity edema and orthopnea  #AKI with CKD stage IIIa Baseline creatinine of 1.45/GFR 52  08/31 0701 - 09/01 0700 In: 536 [P.O.:480; I.V.:56] Out: 5200 [Urine:5200]   Lab Results  Component Value Date   CREATININE 1.84 (H) 12/15/2020   CREATININE 2.48 (H) 12/13/2020   CREATININE 2.97 (H) 12/12/2020    AKI likely secondary to abnormal hemodynamics from cardiorenal syndrome.  Urinalysis negative for blood.  Minimal  protein. Serum creatinine improving IV furosemide infusion, UOP 5.2L Recommend not restarting amlodipine.  Hold carvedilol and enalapril. Oral furosemide 40 mg daily and Metolazone 2.'5mg'$  on Monday and Thursday  at discharge. Will need outpatient follow up at discharge.  #Volume overload with lower extremity edema #Acute exacerbation of chronic systolic CHF. Age 27 2022-2D echo-LVEF 13 to 55%, low normal function, mild LVH, indeterminate diastolic parameters, moderately dilated right atrium, right ventricular systolic function low normal, Avoid hypotension.   Can continue midodrine at home    LOS: [place] 9/1/202212:29 PM  [place], [place]

## 2020-12-15 NOTE — TOC Progression Note (Signed)
Transition of Care [facility]) - Progression Note    Patient Details  Name: ALIYAN GRIZZLE MRN: [id number] Date of Birth: [birth year]  Transition of Care [facility]) CM/SW [place], LCSW Phone Number: 12/15/2020, 2:07 PM  Clinical Narrative:   Referral for triology provided to [facility] with [employer]. Referral for 02 provided to [facility] with [employer].    Expected Discharge Plan: [place] Barriers to Discharge: Continued Medical Work up  Expected Discharge Plan and Services Expected Discharge Plan: [place] In-house Referral: Clinical Social Work, Chaplain     Living arrangements for the past 2 months: Single Family Home                                       Social Determinants of Health (SDOH) Interventions    Readmission Risk Interventions No flowsheet data found.

## 2020-12-16 LAB — PROTIME-INR
INR: 2.6 — ABNORMAL HIGH (ref 0.8–1.2)
Prothrombin Time: 28 seconds — ABNORMAL HIGH (ref 11.4–15.2)

## 2020-12-16 LAB — CBC
HCT: 30.2 % — ABNORMAL LOW (ref 39.0–52.0)
Hemoglobin: 9.5 g/dL — ABNORMAL LOW (ref 13.0–17.0)
MCH: 26.9 pg (ref 26.0–34.0)
MCHC: 31.5 g/dL (ref 30.0–36.0)
MCV: 85.6 fL (ref 80.0–100.0)
Platelets: 109 10*3/uL — ABNORMAL LOW (ref 150–400)
RBC: 3.53 MIL/uL — ABNORMAL LOW (ref 4.22–5.81)
RDW: 15.1 % (ref 11.5–15.5)
WBC: 3.4 10*3/uL — ABNORMAL LOW (ref 4.0–10.5)
nRBC: 0 % (ref 0.0–0.2)

## 2020-12-16 LAB — BASIC METABOLIC PANEL
Anion gap: 5 (ref 5–15)
BUN: 65 mg/dL — ABNORMAL HIGH (ref 8–23)
CO2: 40 mmol/L — ABNORMAL HIGH (ref 22–32)
Calcium: 8.7 mg/dL — ABNORMAL LOW (ref 8.9–10.3)
Chloride: 96 mmol/L — ABNORMAL LOW (ref 98–111)
Creatinine, Ser: 1.74 mg/dL — ABNORMAL HIGH (ref 0.61–1.24)
GFR, Estimated: 42 mL/min — ABNORMAL LOW (ref >60)
Glucose, Bld: 189 mg/dL — ABNORMAL HIGH (ref 70–99)
Potassium: 3.2 mmol/L — ABNORMAL LOW (ref 3.5–5.1)
Sodium: 141 mmol/L (ref 135–145)

## 2020-12-16 LAB — GLUCOSE, CAPILLARY
Glucose-Capillary: 176 mg/dL — ABNORMAL HIGH (ref 70–99)
Glucose-Capillary: 164 mg/dL — ABNORMAL HIGH (ref 70–99)

## 2020-12-16 LAB — MAGNESIUM: Magnesium: 2.3 mg/dL (ref 1.7–2.4)

## 2020-12-16 MED ORDER — WARFARIN SODIUM 3 MG PO TABS
3.0000 mg | ORAL_TABLET | Freq: Once | ORAL | Status: DC
  Filled 2020-12-16: qty 1

## 2020-12-16 MED ORDER — MIDODRINE HCL 5 MG PO TABS
5.0000 mg | ORAL_TABLET | Freq: Three times a day (TID) | ORAL | 1 refills | Status: DC
Start: 2020-12-16 — End: 2020-12-21

## 2020-12-16 MED ORDER — METOLAZONE 2.5 MG PO TABS: ORAL_TABLET | ORAL | 1 refills | Status: DC

## 2020-12-16 NOTE — Progress Notes (Signed)
[place], [place] 12/16/20  Subjective:   Hospital day # 7 Hospital course: Initially he was admitted for treatment of congestive heart failure.  Patient subsequently became lethargic with worsening shortness of breath and leg edema and is transferred to the intensive care unit  8/29-states he was able to eat a good breakfast.  Denies any nausea or vomiting.  Urine output appears to be increasing.  Serum creatinine is also worsening. Overall patient feels improved although he still has large amount of lower extremity edema all the way up to lower abdomen.  Requiring BiPAP at night.  Levophed drip was discontinued at 5 AM this morning.  8/30-continued on IV furosemide.  Improvement in serum creatinine is noted.  Urine output of over 3600 cc in the last 24 hours Patient is insisting on going home today.  8/31- transferred out of ICU yesterday evening. Remains on Lasix drip. Edema has improved. UOP 4.2L in 24 hours. Would like discharge, but not as insistent as yesterday. Feels better today, tolerating meals  Patient laying in bed Alert and oriented States he walked with PT yesterday Tolerating meals, adjusting to required diet Denies shortness of breath   Renal: 09/01 0701 - 09/02 0700 In: 994.3 [P.O.:960; I.V.:34.3] Out: 6225 [Urine:6225] Lab Results  Component Value Date   CREATININE 1.74 (H) 12/16/2020   CREATININE 1.84 (H) 12/15/2020   CREATININE 2.48 (H) 12/13/2020     Objective:  Vital signs in last 24 hours:  Temp:  [97.5 F (36.4 C)-98.4 F (36.9 C)] 97.9 F (36.6 C) (09/02 0747) Pulse Rate:  [83-106] 92 (09/02 0747) Resp:  [17-20] 17 (09/02 0747) BP: (121-138)/(76-86) 121/81 (09/02 0747) SpO2:  [83 %-99 %] 99 % (09/02 0747)  Weight change: -14 kg Filed Weights   12/13/20 2214 12/15/20 0444 12/15/20 1130  Weight: 121.6 kg 122.2 kg 108.2 kg    Intake/Output:    Intake/Output Summary (Last 24 hours) at 12/16/2020 1154 Last data  filed at 12/16/2020 1000 Gross per 24 hour  Intake 994.28 ml  Output 4025 ml  Net -3030.72 ml      Physical Exam: General: No acute distress, laying in the bed  HEENT Anicteric, moist oral mucous membranes  Pulm/lungs Mild bilateral basilar crackles, [place] O2,   CVS/Heart No rub, irregular  Abdomen:  Distended, obese  Extremities: 2 lower extremity edema   Neurologic: Alert, able to follow commands  Skin: Warm, dry          Basic Metabolic Panel:  Recent Labs  Lab 12/11/20 0636 12/11/20 2005 12/12/20 0609 12/13/20 0554 12/14/20 0436 12/15/20 0448 12/16/20 0539  NA 137  --  134* 138  --  140 141  K 5.3* 5.3* 4.9 4.2  --  3.4* 3.2*  CL 100  --  98 97*  --  97* 96*  CO2 31  --  32 33*  --  37* 40*  GLUCOSE 214*  --  219* 208*  --  217* 189*  BUN 42*  --  64* 71*  --  71* 65*  CREATININE 2.74*  --  2.97* 2.48*  --  1.84* 1.74*  CALCIUM 8.5*  --  8.8* 9.0  --  9.0 8.7*  MG 2.0  --  2.3 2.1 2.1 2.3 2.3  PHOS 5.2*  --  4.2  --   --   --   --       CBC: Recent Labs  Lab 12/10/20 2009 12/11/20 [id number] 12/12/20 [id number] 12/13/20 0554 12/16/20 0539  WBC 10.1 10.8* 7.5 7.3 3.4*  NEUTROABS  --   --   --  6.9  --   HGB 9.9* 10.1* 9.3* 9.7* 9.5*  HCT 34.4* 33.9* 30.5* 30.9* 30.2*  MCV 95.6 93.4 89.7 89.3 85.6  PLT 104* 108* 109* 108* 109*      No results found for: HEPBSAG, HEPBSAB, HEPBIGM    Microbiology:  Recent Results (from the past 240 hour(s))  Resp Panel by RT-PCR (Flu A&B, Covid) Nasopharyngeal Swab     Status: None   Collection Time: 12/09/20  5:11 PM   Specimen: Nasopharyngeal Swab; Nasopharyngeal(NP) swabs in vial transport medium  Result Value Ref Range Status   SARS Coronavirus 2 by RT PCR NEGATIVE NEGATIVE Final    Comment: (NOTE) SARS-CoV-2 target nucleic acids are NOT DETECTED.  The SARS-CoV-2 RNA is generally detectable in upper respiratory specimens during the acute phase of infection. The lowest concentration of SARS-CoV-2 viral copies this  assay can detect is 138 copies/mL. A negative result does not preclude SARS-Cov-2 infection and should not be used as the sole basis for treatment or other patient management decisions. A negative result may occur with  improper specimen collection/handling, submission of specimen other than nasopharyngeal swab, presence of viral mutation(s) within the areas targeted by this assay, and inadequate number of viral copies(<138 copies/mL). A negative result must be combined with clinical observations, patient history, and epidemiological information. The expected result is Negative.  Fact Sheet for Patients:  EntrepreneurPulse.com.au  Fact Sheet for Healthcare Providers:  IncredibleEmployment.be  This test is no t yet approved or cleared by the [place] FDA and  has been authorized for detection and/or diagnosis of SARS-CoV-2 by FDA under an Emergency Use Authorization (EUA). This EUA will remain  in effect (meaning this test can be used) for the duration of the COVID-19 declaration under Section 564(b)(1) of the Act, 21 U.S.C.section 360bbb-3(b)(1), unless the authorization is terminated  or revoked sooner.       Influenza A by PCR NEGATIVE NEGATIVE Final   Influenza B by PCR NEGATIVE NEGATIVE Final    Comment: (NOTE) The Xpert Xpress SARS-CoV-2/FLU/RSV plus assay is intended as an aid in the diagnosis of influenza from Nasopharyngeal swab specimens and should not be used as a sole basis for treatment. Nasal washings and aspirates are unacceptable for Xpert Xpress SARS-CoV-2/FLU/RSV testing.  Fact Sheet for Patients: EntrepreneurPulse.com.au  Fact Sheet for Healthcare Providers: IncredibleEmployment.be  This test is not yet approved or cleared by the [place] FDA and has been authorized for detection and/or diagnosis of SARS-CoV-2 by FDA under an Emergency Use Authorization (EUA). This EUA will  remain in effect (meaning this test can be used) for the duration of the COVID-19 declaration under Section 564(b)(1) of the Act, 21 U.S.C. section 360bbb-3(b)(1), unless the authorization is terminated or revoked.  Performed at [facility], [address] [place], [place] [address]   Urine Culture     Status: None   Collection Time: 12/10/20  6:14 PM   Specimen: Urine, Random  Result Value Ref Range Status   Specimen Description   Final    URINE, RANDOM Performed at [facility], [address] [place], [place] [address]    Special Requests   Final    NONE Performed at [facility], [address] [place], [place] [address]    Culture   Final    NO GROWTH Performed at [facility], [place] [address] [place], [place] [address]  Report Status 12/12/2020 FINAL  Final  MRSA Next Gen by PCR, Nasal     Status: None   Collection Time: 12/10/20  6:22 PM   Specimen: Nasal Mucosa; Nasal Swab  Result Value Ref Range Status   MRSA by PCR Next Gen NOT DETECTED NOT DETECTED Final    Comment: (NOTE) The GeneXpert MRSA Assay (FDA approved for NASAL specimens only), is one component of a comprehensive MRSA colonization surveillance program. It is not intended to diagnose MRSA infection nor to guide or monitor treatment for MRSA infections. Test performance is not FDA approved in patients less than 60 years old. Performed at [facility], [address] [place], [place] [address]     Coagulation Studies: Recent Labs    12/14/20 0436 12/15/20 0448 12/16/20 0539  LABPROT 24.2* 26.8* 28.0*  INR 2.2* 2.5* 2.6*     Urinalysis: No results for input(s): COLORURINE, LABSPEC, PHURINE, GLUCOSEU, HGBUR, BILIRUBINUR, KETONESUR, PROTEINUR, UROBILINOGEN, NITRITE, LEUKOCYTESUR in the last 72 hours.  Invalid input(s): APPERANCEUR     Imaging: No results found.   Medications:    sodium chloride     sodium chloride Stopped (12/11/20  1100)   furosemide (LASIX) 200 mg in dextrose 5% 100 mL ('2mg'$ /mL) infusion 5 mg/hr (12/15/20 1826)    budesonide (PULMICORT) nebulizer solution  0.5 mg Nebulization BID   calcitRIOL  0.25 mcg Oral Once per day on Mon Wed Fri   carvedilol  25 mg Oral BID WC   Chlorhexidine Gluconate Cloth  6 each Topical Daily   insulin aspart  0-15 Units Subcutaneous TID WC   insulin aspart  0-5 Units Subcutaneous QHS   midodrine  5 mg Oral TID WC   pravastatin  20 mg Oral q1800   sodium chloride flush  3 mL Intravenous Q12H   traZODone  50 mg Oral QHS   warfarin  3 mg Oral ONCE-1600   Warfarin - Pharmacist Dosing Inpatient   Does not apply q1600   sodium chloride, acetaminophen, levalbuterol, ondansetron (ZOFRAN) IV, sodium chloride flush  Assessment/ Plan:  70 y.o. male with   hypertension, coronary artery disease, congestive heart failure, chronic kidney disease  admitted on 12/09/2020 for Acute on chronic combined systolic and diastolic CHF, NYHA class 3 (HCC) [I50.43] Acute on chronic respiratory failure with hypoxia (HCC) [J96.21] Acute on chronic congestive heart failure, unspecified heart failure type ([place]) [I50.9]  Patient with hypertension, coronary artery disease, CHF, CKD, anemia admitted with worsening shortness of breath with exertion, worsening lower extremity edema and orthopnea  #AKI with CKD stage IIIa Baseline creatinine of 1.45/GFR 52  09/01 0701 - 09/02 0700 In: 994.3 [P.O.:960; I.V.:34.3] Out: 6225 [Urine:6225]   Lab Results  Component Value Date   CREATININE 1.74 (H) 12/16/2020   CREATININE 1.84 (H) 12/15/2020   CREATININE 2.48 (H) 12/13/2020    AKI likely secondary to abnormal hemodynamics from cardiorenal syndrome.  Urinalysis negative for blood.  Minimal protein. Serum creatinine improving IV furosemide infusion, UOP 6.2L Recommend not restarting amlodipine. Continue holding enalapril. Oral furosemide 40 mg daily and Metolazone 2.'5mg'$  on Monday and Thursday at  discharge. Nephrology office will contact to schedule hospital follow up at discharge  #Volume overload with lower extremity edema #Acute exacerbation of chronic systolic CHF. Age 89 2022-2D echo-LVEF 9 to 55%, low normal function, mild LVH, indeterminate diastolic parameters, moderately dilated right atrium, right ventricular systolic function low normal, Avoid hypotension.   Midodrine to avoid hypotension, edema improved    LOS: 7 [employer] 9/2/202211:54  [place], [place]

## 2020-12-16 NOTE — Consult Note (Addendum)
[place] for warfarin Indication: atrial fibrillation   Patient Measurements: Height: 6' (182.9 cm) Weight: 108.2 kg (238 lb 8.6 oz) (Doctor requested patient's weight be measured standing. Weight decrese/ change of 11% from last measuremnet) IBW/kg (Calculated) : 77.6   Vital Signs: Temp: 97.9 F (36.6 C) (09/02 0747) Temp Source: Axillary (09/02 0747) BP: 121/81 (09/02 0747) Pulse Rate: 92 (09/02 0747)  Labs: Recent Labs    12/14/20 0436 12/15/20 0448 12/16/20 0539  HGB  --   --  9.5*  HCT  --   --  30.2*  PLT  --   --  109*  LABPROT 24.2* 26.8* 28.0*  INR 2.2* 2.5* 2.6*  CREATININE  --  1.84* 1.74*     Estimated Creatinine Clearance: 51.6 mL/min (A) (by C-G formula based on SCr of 1.74 mg/dL (H)).   Medical History: Past Medical History:  Diagnosis Date   A-fib (HCC)    CHF (congestive heart failure) (HCC)    Coronary artery disease    Hypertension    Renal disorder     Medications:  Chronic Warfarin regimen: 4 mg daily (TWD = 28 mg)  Assessment: 70 y.o. male with medical history paroxysmal atrial fibrillation stable on chronic warfarin regimen as above. Presented to the ER with progressive shortness of breath lower extremity swelling, orthopnea and PND over the last week.    INR supratherapeutic (3.6) on admission. No new outpatient medication identified (patient not taking prednisone -- verified with patient/wife  Date   INR   Warfarin Dose  8/25   --   4 mg (PTA) 8/26   3.6   Hold  8/27   3.6   Hold 8/28    4.8   Hold 8/29   3.9   HOLD 8/30   2.4   2 mg 8/31   2.2   3 mg 9/01   2.5   3 mg 9/02   2.6   3 mg  HGB stable since admission, Scr improving 2.97>>>1.84>1.74 (baseline ~ 1.4/1.5) DDIs: levofloxacin  Goal of Therapy:  INR 2-3 Monitor platelets by anticoagulation protocol: Yes   Plan:  INR is now therapeutic x 4 Continue warfarin at 3/4 home dose  INR daily  As patient approaches discharge  consider lowering home dose to '3mg'$    Lu Duffel, PharmD, BCPS Clinical Pharmacist   12/16/2020,8:55 AM

## 2020-12-16 NOTE — Progress Notes (Signed)
Occupational Therapy Treatment Patient Details Name: Timothy Chavez MRN: [id number] DOB: May 20, 1950 Today's Date: 12/16/2020    History of present illness Patient is a 70 y.o male admitted with 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 failure secondary to acute systolic CHF exacerbation, right sided pleural effusion, and possible pneumonia. Past medical history of CHF, CAD, CKD, HTN, GERD, diabetes.   OT comments  Mr Cifarelli was seen for OT treatment on this date. Upon arrival to room pt seated in chair eager to leave. Pt requires SUPERVISION + Increased time don underwear/pants seated EOC and don shirt in standing. Pt ID x2 falls hazards and required MIN cues to ID ECS. Pt making good progress toward goals. Pt continues to benefit from skilled OT services to maximize return to PLOF and minimize risk of future falls, injury, caregiver burden, and readmission. Will continue to follow POC. Discharge recommendation remains appropriate.    Follow Up Recommendations  No OT follow up;Supervision - Intermittent    Equipment Recommendations  3 in 1 bedside commode    Recommendations for Other Services      Precautions / Restrictions Precautions Precautions: Fall Restrictions Weight Bearing Restrictions: No       Mobility Bed Mobility               General bed mobility comments: found in chair    Transfers Overall transfer level: Needs assistance   Transfers: Sit to/from Stand Sit to Stand: Supervision              Balance Overall balance assessment: Modified Independent                                         ADL either performed or assessed with clinical judgement   ADL Overall ADL's : Needs assistance/impaired                                       General ADL Comments: SUPERVISION + Increased time don underwear/pants seated EOC and don shirt in standing. Pt ID x2 falls hazards and required MIN cues to ID ECS       Cognition Arousal/Alertness: Awake/alert Behavior During Therapy: WFL for tasks assessed/performed Overall Cognitive Status: Within Functional Limits for tasks assessed                                          Exercises Exercises: Other exercises Other Exercises Other Exercises: Pt educated re: DME recs, d/c recs, falls prevention, ECS Other Exercises: LBD, UBD, sit<>stand, sitting/standing balance/tolerance      General Comments SpO2 92% on RA    Pertinent Vitals/ Pain       Pain Assessment: No/denies pain         Frequency  Min 2X/week        Progress Toward Goals  OT Goals(current goals can now be found in the care plan section)  Progress towards OT goals: Progressing toward goals  Acute Rehab OT Goals Patient Stated Goal: to go home OT Goal Formulation: With patient Time For Goal Achievement: 12/26/20 Potential to Achieve Goals: Good ADL Goals Pt Will Perform Grooming: Independently;standing Pt Will Perform Lower Body Dressing: with modified independence;sit to/from stand Pt Will Transfer to Toilet:  Independently;ambulating;regular height toilet Additional ADL Goal #1: Pt will Independently verbalize plan to implement x3 ECS  Plan Discharge plan remains appropriate;Frequency remains appropriate    Co-evaluation                 AM-PAC OT "6 Clicks" Daily Activity     Outcome Measure   Help from another person eating meals?: None Help from another person taking care of personal grooming?: None Help from another person toileting, which includes using toliet, bedpan, or urinal?: None Help from another person bathing (including washing, rinsing, drying)?: A Little Help from another person to put on and taking off regular upper body clothing?: None Help from another person to put on and taking off regular lower body clothing?: A Little 6 Click Score: 22    End of Session    OT Visit Diagnosis: Muscle weakness (generalized)  (M62.81)   Activity Tolerance Patient tolerated treatment well   Patient Left in chair;with call bell/phone within reach;with family/visitor present   Nurse Communication Mobility status        Time: [id number] OT Time Calculation (min): 16 min  Charges: OT General Charges $OT Visit: 1 Visit OT Treatments $Self Care/Home Management : 8-22 mins  Dessie Coma, M.S. OTR/L  12/16/20, 3:55 PM  ascom [phone]

## 2020-12-16 NOTE — Progress Notes (Signed)
Pulmonary Medicine          Date: 12/16/2020,   MRN# [id number] Timothy Chavez Aug 14, 1950     AdmissionWeight: 127 kg                 CurrentWeight: 108.2 kg (Doctor requested patient's weight be measured standing. Weight decrese/ change of 11% from last measuremnet)   Referring physician: Dr Mal Misty   CHIEF COMPLAINT:   Acute hypoxemic respiratory failure   HISTORY OF PRESENT ILLNESS   This is a 70 year old male with a history of significant comorbid history as below including atrial fibrillation congestive heart failure coronary disease chronic renal failure and essential hypertension came in with acute hypoxemic respiratory failure.  He was placed on supplemental oxygen however continued to be hypoxemic.  Blood work on initial admission showed elevation of BNP with mild anemia with a hemoglobin of 10 and elevated INR at 3.6.  He was negative for COVID and influenza.  He had chest x-ray which showed interstitial edema and moderate right pleural effusion.  He was subsequently noted to be lethargic and hypotensive and was transferred to stepdown unit transiently required Levophed drip as well as BiPAP.  ABG was done which showed hypercapnia with hypoxemia.  He shares BIPAP is disruptive to his sleep and is keeping him awake.   12/15/20- patient with substantial improvement clinically.  I anticipate need for positive pressure ventilation long term. We discussed home NIV use and I placed order for device qualification.  [facility] staff have been informed and will be working on this.  He is optimized from pulmonary perspective for dc home and should be seen on outpatient in clinic with [facility] pulmonolgy within 1 wk post dc.   12/16/20- patient is cleared for dc home.  I ordered BIPAP for home use and reviewed qualifcation with [facility].  He should have device within week or so.     PAST MEDICAL HISTORY   Past Medical History:  Diagnosis Date   A-fib [facility])    CHF (congestive  heart failure) ([place])    Coronary artery disease    Hypertension    Renal disorder      SURGICAL HISTORY   History reviewed. No pertinent surgical history.   FAMILY HISTORY   History reviewed. No pertinent family history.   SOCIAL HISTORY   Social History   Tobacco Use   Smoking status: Never    Passive exposure: Never   Smokeless tobacco: Never  Substance Use Topics   Alcohol use: Not Currently   Drug use: Not Currently     MEDICATIONS    Home Medication:    Current Medication:  Current Facility-Administered Medications:    0.9 %  sodium chloride infusion, 250 mL, Intravenous, PRN, Jonelle Sidle, Mohammad L, MD   0.9 %  sodium chloride infusion, 250 mL, Intravenous, Continuous, Dorothe Pea, RPH, Stopped at 12/11/20 1100   acetaminophen (TYLENOL) tablet 650 mg, 650 mg, Oral, Q4H PRN, Jonelle Sidle, Mohammad L, MD   budesonide (PULMICORT) nebulizer solution 0.5 mg, 0.5 mg, Nebulization, BID, Graves, Dana E, NP, 0.5 mg at 12/16/20 0743   calcitRIOL (ROCALTROL) capsule 0.25 mcg, 0.25 mcg, Oral, Once per day on Mon Wed Fri, Garba, Mohammad L, MD, 0.25 mcg at 12/16/20 0814   carvedilol (COREG) tablet 25 mg, 25 mg, Oral, BID WC, Jennye Boroughs, MD, 25 mg at 12/16/20 [id number]   Chlorhexidine Gluconate Cloth 2 % PADS 6 each, 6 each, Topical, Daily, Fritzi Mandes, MD, 6 each  at 12/16/20 0811   furosemide (LASIX) 200 mg in dextrose 5 % 100 mL (2 mg/mL) infusion, 5 mg/hr, Intravenous, Continuous, Ouma, Bing Neighbors, NP, Last Rate: 2.5 mL/hr at 12/15/20 1826, 5 mg/hr at 12/15/20 1826   insulin aspart (novoLOG) injection 0-15 Units, 0-15 Units, Subcutaneous, TID WC, Elwyn Reach, MD, 3 Units at 12/16/20 0811   insulin aspart (novoLOG) injection 0-5 Units, 0-5 Units, Subcutaneous, QHS, Gala Romney L, MD, 2 Units at 12/15/20 2120   levalbuterol (XOPENEX) nebulizer solution 1.25 mg, 1.25 mg, Nebulization, Q6H PRN, Fritzi Mandes, MD   midodrine (PROAMATINE) tablet 5 mg, 5 mg, Oral, TID WC,  Candiss Norse, Harmeet, MD, 5 mg at 12/16/20 0811   ondansetron (ZOFRAN) injection 4 mg, 4 mg, Intravenous, Q6H PRN, Jonelle Sidle, Mohammad L, MD   pravastatin (PRAVACHOL) tablet 20 mg, 20 mg, Oral, q1800, Jonelle Sidle, Mohammad L, MD, 20 mg at 12/15/20 1635   sodium chloride flush (NS) 0.9 % injection 3 mL, 3 mL, Intravenous, Q12H, Jonelle Sidle, Mohammad L, MD, 3 mL at 12/16/20 0814   sodium chloride flush (NS) 0.9 % injection 3 mL, 3 mL, Intravenous, PRN, Elwyn Reach, MD   traZODone (DESYREL) tablet 50 mg, 50 mg, Oral, QHS, Sharion Settler, NP, 50 mg at 12/15/20 2119   warfarin (COUMADIN) tablet 3 mg, 3 mg, Oral, ONCE-1600, Shanlever, Pierce Crane, [facility]   Warfarin - Pharmacist Dosing Inpatient, , Does not apply, q1600, Dorothe Pea, [facility], Given at 12/15/20 1651    ALLERGIES   Penicillin v and Penicillins     REVIEW OF SYSTEMS    Review of Systems:  Gen:  Denies  fever, sweats, chills weigh loss  HEENT: Denies blurred vision, double vision, ear pain, eye pain, hearing loss, nose bleeds, sore throat Cardiac:  No dizziness, chest pain or heaviness, chest tightness,edema Resp:   Denies cough or sputum porduction, shortness of breath,wheezing, hemoptysis,  Gi: Denies swallowing difficulty, stomach pain, nausea or vomiting, diarrhea, constipation, bowel incontinence Gu:  Denies bladder incontinence, burning urine Ext:   Denies Joint pain, stiffness or swelling Skin: Denies  skin rash, easy bruising or bleeding or hives Endoc:  Denies polyuria, polydipsia , polyphagia or weight change Psych:   Denies depression, insomnia or hallucinations   Other:  All other systems negative   VS: BP 121/81 (BP Location: Right Arm)   Pulse 92   Temp 97.9 F (36.6 C) (Axillary)   Resp 17   Ht 6' (1.829 m)   Wt 108.2 kg Comment: Doctor requested patient's weight be measured standing. Weight decrese/ change of 11% from last measuremnet  SpO2 99%   BMI 32.35 kg/m      PHYSICAL EXAM    GENERAL:NAD, no fevers,  chills, no weakness no fatigue HEAD: Normocephalic, atraumatic.  EYES: Pupils equal, round, reactive to light. Extraocular muscles intact. No scleral icterus.  MOUTH: Moist mucosal membrane. Dentition intact. No abscess noted.  EAR, NOSE, THROAT: Clear without exudates. No external lesions.  NECK: Supple. No thyromegaly. No nodules. No JVD.  PULMONARY: mild crackles bilaterally CARDIOVASCULAR: S1 and S2. Regular rate and rhythm. No murmurs, rubs, or gallops. No edema. Pedal pulses 2+ bilaterally.  GASTROINTESTINAL: Soft, nontender, nondistended. No masses. Positive bowel sounds. No hepatosplenomegaly.  MUSCULOSKELETAL: No swelling, clubbing, or edema. Range of motion full in all extremities.  NEUROLOGIC: Cranial nerves II through XII are intact. No gross focal neurological deficits. Sensation intact. Reflexes intact.  SKIN: No ulceration, lesions, rashes, or cyanosis. Skin warm and dry. Turgor intact.  PSYCHIATRIC: Mood,  affect within normal limits. The patient is awake, alert and oriented x 3. Insight, judgment intact.       IMAGING    DG Chest 2 View  Result Date: 12/09/2020 CLINICAL DATA:  Shortness of breath, lower extremity swelling. Chronic cough. EXAM: CHEST - 2 VIEW COMPARISON:  Chest radiograph 04/27/2008 FINDINGS: Small to moderate right pleural effusion with right lower lobe atelectasis. Diffuse hazy interstitial thickening, consistent with pulmonary edema. No pneumothorax. Heart size is difficult to evaluate due to adjacent consolidation but appears enlarged, stable. Mildly tortuous thoracic aorta with calcific atherosclerosis. No acute osseous abnormality. IMPRESSION: Pulmonary edema with small to moderate right pleural effusion. Stable cardiomegaly. Electronically Signed   By: Ileana Roup M.D.   On: 12/09/2020 15:27   CT HEAD WO CONTRAST (5MM)  Result Date: 12/10/2020 CLINICAL DATA:  Delirium.  Acute mental status change. EXAM: CT HEAD WITHOUT CONTRAST TECHNIQUE: Contiguous  axial images were obtained from the base of the skull through the vertex without intravenous contrast. COMPARISON:  None. FINDINGS: The study is mildly motion degraded. Brain: There is no evidence of an acute cortically based infarct, intracranial hemorrhage, mass, midline shift, or extra-axial fluid collection. Hypodensities in the cerebral white matter bilaterally are nonspecific but compatible with mild-to-moderate chronic small vessel ischemic disease. Lacunar infarcts in the left greater than right basal ganglia are chronic in appearance. There are age indeterminate lacunar infarcts in the left corona radiata and left thalamus. The ventricles and sulci are within normal limits for age. Vascular: Calcified atherosclerosis at the skull base. No hyperdense vessel. Skull: No fracture or suspicious osseous lesion. Sinuses/Orbits: Large mucous retention cyst in the right sphenoid sinus. Clear mastoid air cells. Unremarkable orbits. Other: None. IMPRESSION: 1. No evidence of an acute cortically based infarct or intracranial hemorrhage. 2. Age indeterminate lacunar infarcts in the left corona radiata and left thalamus. 3. Chronic small vessel ischemia with chronic lacunar infarcts in the basal ganglia. Electronically Signed   By: Logan Bores M.D.   On: 12/10/2020 13:26   DG Chest Port 1 View  Result Date: 12/11/2020 CLINICAL DATA:  Worsening leg edema, shortness of breath. History of congestive heart failure. EXAM: PORTABLE CHEST 1 VIEW COMPARISON:  Chest x-rays dated 12/10/2020, 12/09/2020 and 04/27/2008 FINDINGS: Stable cardiomegaly. Prominent interstitial markings are again seen bilaterally. Hazy opacity persists at the RIGHT lung base, likely indicating small RIGHT pleural effusion. No new lung findings. No pneumothorax is seen. IMPRESSION: 1. Stable chest x-ray. Probable small RIGHT pleural effusion. Underlying pneumonia not excluded. 2. Stable cardiomegaly and mild interstitial prominence, likely a chronic  mild CHF. Electronically Signed   By: Franki Cabot M.D.   On: 12/11/2020 12:00   DG Chest Port 1 View  Result Date: 12/10/2020 CLINICAL DATA:  The patient is unresponsive. EXAM: PORTABLE CHEST 1 VIEW COMPARISON:  December 09, 2020 FINDINGS: Cardiomegaly is stable. The hila and mediastinum are unremarkable. Layering effusion and underlying opacity in the right base. Increased interstitial markings bilaterally. No other acute abnormalities. IMPRESSION: 1. Right-sided layering pleural effusion with underlying opacity. 2. Cardiomegaly and mild edema. Electronically Signed   By: Dorise Bullion III M.D.   On: 12/10/2020 18:59   ECHOCARDIOGRAM COMPLETE  Result Date: 12/10/2020    ECHOCARDIOGRAM REPORT   Patient Name:   JERAMIH BATTANI Date of Exam: 12/10/2020 Medical Rec #:  [id number]       Height:       72.0 in Accession #:    [id number]      Weight:  279.9 lb Date of Birth:  1950/11/26        BSA:          2.458 m Patient Age:    82 years        BP:           108/81 mmHg Patient Gender: M               HR:           98 bpm. Exam Location:  [facility] Procedure: 2D Echo and Intracardiac Opacification Agent Indications:     CHF I50.21  History:         Patient has no prior history of Echocardiogram examinations.  Sonographer:     Kathlen Brunswick RDCS Referring Phys:  [place] Diagnosing Phys: Donnelly Angelica  Sonographer Comments: Technically difficult study due to poor echo windows, patient is morbidly obese and suboptimal apical window. Image acquisition challenging due to patient body habitus. IMPRESSIONS  1. Left ventricular ejection fraction, by estimation, is 50 to 55%. The left ventricle has low normal function. The left ventricle has no regional wall motion abnormalities. There is mild left ventricular hypertrophy. Left ventricular diastolic parameters are indeterminate.  2. Right ventricular systolic function is low normal. The right ventricular size is mildly enlarged.  3. Right atrial size was  moderately dilated.  4. The mitral valve is normal in structure. Trivial mitral valve regurgitation. No evidence of mitral stenosis.  5. The aortic valve is normal in structure. Aortic valve regurgitation is not visualized. Mild aortic valve sclerosis is present, with no evidence of aortic valve stenosis.  6. The inferior vena cava is dilated in size with <50% respiratory variability, suggesting right atrial pressure of 15 mmHg. FINDINGS  Left Ventricle: Left ventricular ejection fraction, by estimation, is 50 to 55%. The left ventricle has low normal function. The left ventricle has no regional wall motion abnormalities. Definity contrast agent was given IV to delineate the left ventricular endocardial borders. The left ventricular internal cavity size was normal in size. There is mild left ventricular hypertrophy. Left ventricular diastolic parameters are indeterminate. Right Ventricle: The right ventricular size is mildly enlarged. Right vetricular wall thickness was not well visualized. Right ventricular systolic function is low normal. Left Atrium: Left atrial size was normal in size. Right Atrium: Right atrial size was moderately dilated. Pericardium: There is no evidence of pericardial effusion. Mitral Valve: The mitral valve is normal in structure. Trivial mitral valve regurgitation. No evidence of mitral valve stenosis. Tricuspid Valve: The tricuspid valve is normal in structure. Tricuspid valve regurgitation is trivial. Aortic Valve: The aortic valve is normal in structure. Aortic valve regurgitation is not visualized. Mild aortic valve sclerosis is present, with no evidence of aortic valve stenosis. Aortic valve peak gradient measures 5.9 mmHg. Pulmonic Valve: The pulmonic valve was not well visualized. Pulmonic valve regurgitation is trivial. No evidence of pulmonic stenosis. Aorta: The aortic root is normal in size and structure. Venous: The inferior vena cava is dilated in size with less than 50%  respiratory variability, suggesting right atrial pressure of 15 mmHg. IAS/Shunts: The interatrial septum was not assessed.  LEFT VENTRICLE PLAX 2D LVIDd:         5.20 cm LVIDs:         3.70 cm LV PW:         1.40 cm LV IVS:        1.20 cm LVOT diam:     2.20 cm LV SV:  55 LV SV Index:   22 LVOT Area:     3.80 cm  RIGHT VENTRICLE RV Basal diam:  5.20 cm RV S prime:     13.60 cm/s TAPSE (M-mode): 1.6 cm LEFT ATRIUM             Index       RIGHT ATRIUM           Index LA diam:        3.60 cm 1.46 cm/m  RA Area:     27.80 cm LA Vol (A2C):   66.2 ml 26.94 ml/m RA Volume:   101.00 ml 41.09 ml/m LA Vol (A4C):   67.8 ml 27.59 ml/m LA Biplane Vol: 67.0 ml 27.26 ml/m  AORTIC VALVE                PULMONIC VALVE AV Area (Vmax): 1.53 cm    PV Vmax:       1.02 m/s AV Vmax:        121.00 cm/s PV Peak grad:  4.2 mmHg AV Peak Grad:   5.9 mmHg LVOT Vmax:      48.60 cm/s LVOT Vmean:     51.400 cm/s LVOT VTI:       0.144 m  AORTA Ao Root diam: 3.30 cm Ao Asc diam:  3.50 cm MITRAL VALVE MV Area (PHT): 4.93 cm     SHUNTS MV Decel Time: 154 msec     Systemic VTI:  0.14 m MV E velocity: 143.00 cm/s  Systemic Diam: 2.20 cm Donnelly Angelica Electronically signed by Donnelly Angelica Signature Date/Time: 12/10/2020/3:19:42 PM    Final          ASSESSMENT/PLAN   Acute on chronic hypoxemic hypercapnic respiratory faiure -Patient reports improvement clinically  -He had pleural effusion which is improved on most recent CXR -he has CHF with CKD and chronic anasarca -he reports intolerance to BIPAP and this may be hindering his progress with pulmonary edema - He should work with RT to improve BIPAP interface leakage and compliance.  I have reviewed issues with BIPAP and discussed with RT and they will assist with compliance and interface refitting tonight  -patient Should have BIPAP nightly while on lasix gtt  -Home NIV ordered- [facility] notified    Thank you for allowing me to participate in the care of this patient.      Patient/Family are satisfied with care plan and all questions have been answered.  This document was prepared using Dragon voice recognition software and may include unintentional dictation errors.     Ottie Glazier, M.D.  Division of [place]

## 2020-12-16 NOTE — Care Management Important Message (Signed)
Important Message  Patient Details  Name: Timothy Chavez MRN: [id number] Date of Birth: 1950/11/22   Medicare Important Message Given:  Yes     Dannette Barbara 12/16/2020, 2:23 PM

## 2020-12-16 NOTE — Discharge Summary (Addendum)
DISCHARGE SUMMARY  Timothy Chavez  MR#: [id number]  DOB:[birth year]  Date of Admission: 12/09/2020 Date of Discharge: 12/16/2020  Attending Physician:Timothy Bresee Hennie Duos, MD  Patient's FY:[id number], Timothy Douglas, MD  Consults: Nephrology Pulmonary  Disposition: D/C home   Follow-up Appts:  Follow-up Information     Timothy Legato, MD. Go on 12/15/2020.   Specialty: Nephrology Why: Leanne Lovely information: [place] [address] [phone]         Timothy Crouch, MD Follow up in 1 week(s).   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  Hospital Course:  Acute on chronic combined systolic and diastolic CHF -cardiogenic shock - chronic anasarca - pleural effusions  Initially required Levophed infusion for blood pressure support -TTE this admission suggested EF 50-55% which is an improvement -ACE inhibitor discontinued due to hypotension - stabilized w/ diuresis    Acute kidney injury on CKD stage III AAA Nephrology followed as inpatient  - felt to be cardiorenal in etiolgy - creatinine has now stabilized - ACEi stopped - norvasc stopped - lasix '40mg'$  QD to continue at home, w/ zaroxolyn 2.'5mg'$  on Mondays and Thursdays - to f/u w/  Nephrology as outpt (office will call to arrange)   Hypokalemia Due to diuresis - supplemented    Acute COPD exacerbation  Completed a course of steroid therapy -wheezing resolved -continue maintenance inhalers   Acute on chronic hypoxic and hypercarbic respiratory failure Will require oxygen support at home during the day at 2 L/min (see RN note 12/15/20) - requires Trilogy device for home support at night - Pulmonary attended to these issues during his admisison, and arranged for home support to include O2 as wells as Trilogy device    Paroxysmal atrial fibrillation with acute RVR Exacerbated by above - HR controlled at time of d/c - BB dose increased during this admit   DM2 Diet controlled - follow CBG log as oupt   Allergies as of 12/16/2020       Reactions   Penicillin V    Other reaction(s): Unknown Other reaction(s): Unknown As a child   Penicillins         Medication List     STOP taking these medications    amLODipine 10 MG tablet Commonly known as: NORVASC   enalapril 20 MG tablet Commonly known as: VASOTEC       TAKE these medications    acetaminophen 650 MG CR tablet Commonly known as: TYLENOL Take 650 mg by mouth as needed for pain.   calcitRIOL 0.25 MCG capsule Commonly known as: ROCALTROL Take 1 capsule by mouth 3 (three) times a week.   carvedilol 25 MG tablet Commonly known as: COREG Take 25 mg by mouth 2 (two) times daily.   furosemide 40 MG tablet Commonly known as: LASIX Take 40 mg by mouth daily.  hydrALAZINE 50 MG tablet Commonly known as: APRESOLINE Take 50 mg by mouth 4 (four) times daily.   metolazone 2.5 MG tablet Commonly known as: ZAROXOLYN One tablet every Mondays and Thursday morning only   midodrine 5 MG tablet Commonly known as: PROAMATINE Take 1 tablet (5 mg total) by mouth 3 (three) times daily with meals.   potassium chloride SA 20 MEQ tablet Commonly known as: KLOR-CON Take 20 mEq by mouth daily.    pravastatin 20 MG tablet Commonly known as: PRAVACHOL Take 20 mg by mouth daily as needed.   warfarin 4 MG tablet Commonly known as: COUMADIN Take 4 mg by mouth daily.               Durable Medical Equipment  (From admission, onward)           Start     Ordered   12/15/20 0000  For home use only DME oxygen       Question Answer Comment  Length of Need Lifetime   Mode or (Route) Nasal cannula   Liters per Minute 2   Frequency Continuous (stationary and portable oxygen unit needed)   Oxygen conserving device Yes   Oxygen delivery system Gas      12/15/20 1450            Day of Discharge BP 126/76 (BP Location: Right Arm)   Pulse 99   Temp 98.2 F (36.8 C)   Resp 16   Ht 6' (1.829 m)   Wt 108.2 kg Comment: Doctor requested patient's weight be measured standing. Weight decrese/ change of 11% from last measuremnet  SpO2 90%   BMI 32.35 kg/m   Physical Exam: General: No acute respiratory distress Lungs: Clear to auscultation bilaterally without wheezes or crackles Cardiovascular: Regular rate without murmur gallop or rub normal S1 and S2 Abdomen: Nontender, nondistended, soft, bowel sounds positive, no rebound, no ascites, no appreciable mass Extremities: No significant cyanosis, clubbing, or edema bilateral lower extremities  Basic Metabolic Panel: Recent Labs  Lab 12/11/20 0636 12/11/20 2005 12/12/20 0609 12/13/20 0554 12/14/20 0436 12/15/20 0448 12/16/20 0539  NA 137  --  134* 138  --  140 141  K 5.3* 5.3* 4.9 4.2  --  3.4* 3.2*  CL 100  --  98 97*  --  97* 96*  CO2 31  --  32 33*  --  37* 40*  GLUCOSE 214*  --  219* 208*  --  217* 189*  BUN 42*  --  64* 71*  --  71* 65*  CREATININE 2.74*  --  2.97* 2.48*  --  1.84* 1.74*  CALCIUM 8.5*  --  8.8* 9.0  --  9.0 8.7*  MG 2.0  --  2.3 2.1 2.1 2.3 2.3  PHOS 5.2*  --  4.2  --   --   --   --     Liver Function Tests: Recent Labs  Lab 12/12/20 0609  AST 15  ALT 10  ALKPHOS 55  BILITOT 0.7   PROT 6.2*  ALBUMIN 2.4*     Coags: Recent Labs  Lab 12/12/20 0609 12/13/20 0554 12/14/20 0436 12/15/20 0448 12/16/20 0539  INR 3.9* 2.4* 2.2* 2.5* 2.6*     CBC: Recent Labs  Lab 12/10/20 2009 12/11/20 0636 12/12/20 0609 12/13/20 0554 12/16/20 0539  WBC 10.1 10.8* 7.5 7.3 3.4*  NEUTROABS  --   --   --  6.9  --   HGB 9.9* 10.1* 9.3* 9.7* 9.5*  HCT 34.4* 33.9* 30.5* 30.9* 30.2*  MCV 95.6 93.4 89.7 89.3 85.6  PLT 104* 108* 109* 108* 109*     BNP (last 3 results) Recent Labs    12/09/20 1445 12/12/20 0609  BNP 590.9* 240.8*      CBG: Recent Labs  Lab 12/15/20 1625 12/15/20 2047 12/15/20 2114 12/16/20 0744 12/16/20 1231  GLUCAP 279* 237* 211* 176* 164*    Recent Results (from the past 240 hour(s))  Resp Panel by RT-PCR (Flu A&B, Covid) Nasopharyngeal Swab     Status: None   Collection Time: 12/09/20  5:11 PM   Specimen: Nasopharyngeal Swab; Nasopharyngeal(NP) swabs in vial transport medium  Result Value Ref Range Status   SARS Coronavirus 2 by RT PCR NEGATIVE NEGATIVE Final    Comment: (NOTE) SARS-CoV-2 target nucleic acids are NOT DETECTED.  The SARS-CoV-2 RNA is generally detectable in upper respiratory specimens during the acute phase of infection. The lowest concentration of SARS-CoV-2 viral copies this assay can detect is 138 copies/mL. A negative result does not preclude SARS-Cov-2 infection and should not be used as the sole basis for treatment or other patient management decisions. A negative result may occur with  improper specimen collection/handling, submission of specimen other than nasopharyngeal swab, presence of viral mutation(s) within the areas targeted by this assay, and inadequate number of viral copies(<138 copies/mL). A negative result must be combined with clinical observations, patient history, and epidemiological information. The expected result is Negative.  Fact Sheet for Patients:   EntrepreneurPulse.com.au  Fact Sheet for Healthcare Providers:  IncredibleEmployment.be  This test is no t yet approved or cleared by the [place] FDA and  has been authorized for detection and/or diagnosis of SARS-CoV-2 by FDA under an Emergency Use Authorization (EUA). This EUA will remain  in effect (meaning this test can be used) for the duration of the COVID-19 declaration under Section 564(b)(1) of the Act, 21 U.S.C.section 360bbb-3(b)(1), unless the authorization is terminated  or revoked sooner.       Influenza A by PCR NEGATIVE NEGATIVE Final   Influenza B by PCR NEGATIVE NEGATIVE Final    Comment: (NOTE) The Xpert Xpress SARS-CoV-2/FLU/RSV plus assay is intended as an aid in the diagnosis of influenza from Nasopharyngeal swab specimens and should not be used as a sole basis for treatment. Nasal washings and aspirates are unacceptable for Xpert Xpress SARS-CoV-2/FLU/RSV testing.  Fact Sheet for Patients: EntrepreneurPulse.com.au  Fact Sheet for Healthcare Providers: IncredibleEmployment.be  This test is not yet approved or cleared by the [place] FDA and has been authorized for detection and/or diagnosis of SARS-CoV-2 by FDA under an Emergency Use Authorization (EUA). This EUA will remain in effect (meaning this test can be used) for the duration of the COVID-19 declaration under Section 564(b)(1) of the Act, 21 U.S.C. section 360bbb-3(b)(1), unless the authorization is terminated or revoked.  Performed at [facility], [address] [place], [place] [address]   Urine Culture     Status: None   Collection Time: 12/10/20  6:14 PM   Specimen: Urine, Random  Result Value Ref Range Status   Specimen Description   Final    URINE, RANDOM Performed at [facility], [address] [place], [place] [address]    Special Requests   Final    NONE Performed at  [facility], [address], [place], [place] [address]    Culture   Final    NO GROWTH Performed at [facility], [place]  [address], [place], [place] [address]    Report Status 12/12/2020 FINAL  Final  MRSA Next Gen by PCR, Nasal     Status: None   Collection Time: 12/10/20  6:22 PM   Specimen: Nasal Mucosa; Nasal Swab  Result Value Ref Range Status   MRSA by PCR Next Gen NOT DETECTED NOT DETECTED Final    Comment: (NOTE) The GeneXpert MRSA Assay (FDA approved for NASAL specimens only), is one component of a comprehensive MRSA colonization surveillance program. It is not intended to diagnose MRSA infection nor to guide or monitor treatment for MRSA infections. Test performance is not FDA approved in patients less than 4 years old. Performed at [facility], [address] [place], [place] [address]       Time spent in discharge (includes decision making & examination of pt): 35 minutes  12/16/2020, 2:05 PM   Cherene Altes, MD [facility] Office  [phone]

## 2020-12-16 NOTE — TOC Progression Note (Signed)
Transition of Care [facility]) - Progression Note    Patient Details  Name: Timothy Chavez MRN: [id number] Date of Birth: 07-Jul-1950  [facility] CM/SW Contact  Eileen Stanford, LCSW Phone Number: 12/16/2020, 3:15 PM  Clinical Narrative:   CSW unable to find a [place] agency to service pt with given insurance.    Expected Discharge Plan: [place] Barriers to Discharge: Continued Medical Work up  Expected Discharge Plan and Services Expected Discharge Plan: [place] In-house Referral: Clinical Social Work, Chaplain     Living arrangements for the past 2 months: Single Family Home Expected Discharge Date: 12/16/20                                     Social Determinants of Health (SDOH) Interventions    Readmission Risk Interventions No flowsheet data found.

## 2020-12-20 NOTE — Progress Notes (Signed)
Patient ID: Timothy Chavez, male    DOB: April 26, 1950, 70 y.o.   MRN: [id number]  HPI  Timothy Chavez is a 70 y/o male with a history of HTN, CAD, CKD, atrial fibrillation, DM & chronic heart failure.   Echo report from 12/10/20 reviewed and showed an EF of 50-55% along with mild LVH.  Admitted 12/09/20 due to anasarca and pleural effusions. Initially required Levophed infusion for blood pressure support. Nephrology and pulmonology referrals made. ACE-I stopped due to hypotension and renal disease. Needs 2L oxygen along with trilogy. Beta-blocker increased. Discharged after 7 days.   He presents today for his initial visit with a chief complaint of minimal fatigue upon moderate exertion. He describes this as chronic in nature having been present for several years. He has associated pedal edema, palpitations, leg pain, easy bruising, anxiety and difficulty sleeping along with this. He denies any dizziness, abdominal distention, chest pain, shortness of breath, cough or weight gain.   Says that he tried to make a PCP appointment but after being on hold "forever" yesterday, he gave up.   He says that he was sent home with oxygen and a tank and "another machine" but no one has been out to show him how to use it. He says that he was called yesterday to set up auto payment but he refused saying he wasn't going to set it up until he was shown how to use it.   He also says that since discharge 5 days ago, he's only taken his furosemide because he was so confused about all the medication changes that he was afraid that he would mess up and make a mistake with taking them.   He has a NP from [facility] through [facility] that is coming to his home on 12/26/20  Past Medical History:  Diagnosis Date   A-fib [facility])    CHF (congestive heart failure) (HCC)    Coronary artery disease    Diabetes mellitus without complication ([place])    Hypertension    Renal disorder    History reviewed. No pertinent  surgical history. History reviewed. No pertinent family history. Social History   Tobacco Use   Smoking status: Never    Passive exposure: Never   Smokeless tobacco: Never  Substance Use Topics   Alcohol use: Not Currently   Allergies  Allergen Reactions   Penicillin V     Other reaction(s): Unknown Other reaction(s): Unknown As a child    Penicillins    Prior to Admission medications   Medication Sig Start Date End Date Taking? Authorizing Provider  acetaminophen (TYLENOL) 650 MG CR tablet Take 650 mg by mouth as needed for pain.   Yes [provider]  calcitRIOL (ROCALTROL) 0.25 MCG capsule Take 1 capsule by mouth 3 (three) times a week. 12/06/20  Yes [provider]  carvedilol (COREG) 25 MG tablet Take 25 mg by mouth 2 (two) times daily. 10/26/20  Yes [provider]  enalapril (VASOTEC) 20 MG tablet Take 1 tablet (20 mg total) by mouth daily. 12/21/20  Yes Meliah Appleman, Otila Kluver A, FNP  furosemide (LASIX) 40 MG tablet Take 40 mg by mouth daily. 10/26/20  Yes [provider]  potassium chloride SA (KLOR-CON) 20 MEQ tablet Take 20 mEq by mouth daily. 10/26/20  Yes [provider]  pravastatin (PRAVACHOL) 20 MG tablet Take 20 mg by mouth daily as needed. 10/26/20  Yes [provider]  warfarin (COUMADIN) 4 MG tablet Take 4 mg by mouth  daily. 10/26/20  Yes [provider]  metolazone (ZAROXOLYN) 2.5 MG tablet One tablet every Mondays and Thursday morning only Patient not taking: Reported on 12/21/2020 12/16/20   Cherene Altes, MD   Review of Systems  Constitutional:  Positive for fatigue (minimal). Negative for appetite change.  HENT:  Negative for congestion, postnasal drip and sore throat.   Eyes: Negative.   Respiratory:  Negative for cough, chest tightness and shortness of breath.   Cardiovascular:  Positive for palpitations and leg swelling. Negative for chest pain.  Gastrointestinal:  Negative for abdominal distention and  abdominal pain.  Endocrine: Negative.   Genitourinary: Negative.   Musculoskeletal:  Positive for arthralgias (legs). Negative for back pain.  Skin:  Positive for color change (left lower leg red).  Allergic/Immunologic: Negative.   Neurological:  Negative for dizziness and light-headedness.  Hematological:  Negative for adenopathy. Bruises/bleeds easily.  Psychiatric/Behavioral:  Positive for sleep disturbance (sleeping on 1 pillow). Negative for dysphoric mood. The patient is nervous/anxious.    Vitals:   12/21/20 1237  BP: (!) 148/86  Pulse: (!) 125  Resp: 18  SpO2: 95%  Weight: 254 lb 2 oz (115.3 kg)  Height: 6' (1.829 m)   Wt Readings from Last 3 Encounters:  12/21/20 254 lb 2 oz (115.3 kg)  12/15/20 238 lb 8.6 oz (108.2 kg)   Lab Results  Component Value Date   CREATININE 1.74 (H) 12/16/2020   CREATININE 1.84 (H) 12/15/2020   CREATININE 2.48 (H) 12/13/2020    Physical Exam Vitals and nursing note reviewed. Exam conducted with a chaperone present (sister).  Constitutional:      Appearance: Normal appearance.  HENT:     Head: Normocephalic and atraumatic.  Cardiovascular:     Rate and Rhythm: Regular rhythm. Tachycardia present.  Pulmonary:     Effort: Pulmonary effort is normal. No respiratory distress.     Breath sounds: No wheezing or rales.  Abdominal:     General: There is no distension.     Palpations: Abdomen is soft.  Musculoskeletal:        General: No tenderness.     Cervical back: Normal range of motion and neck supple.     Right lower leg: Edema (3+ pitting) present.     Left lower leg: Edema (3+ pitting) present.  Skin:    Findings: Erythema (left lower leg) present.  Neurological:     General: No focal deficit present.     Mental Status: He is alert and oriented to person, place, and time.  Psychiatric:        Mood and Affect: Mood normal.        Behavior: Behavior normal.        Thought Content: Thought content normal.   Assessment &  Plan:  1: Chronic heart failure with preserved ejection fraction with structural changes (LVH)- - NYHA class II - euvolemic today - weighing daily; reminded to call for an overnight weight gain of > 2 pounds or a weekly weight gain of > 5 pounds - not adding salt but to a few foods; reviewed the importance of reading food labels for sodium content and to keep his daily sodium intake to '2000mg'$  / day; low sodium cookbook given to him - says that he's not "drinking very much"; explained that importance of keeping daily fluid intake to ~ 60 ounces/ day - advised him to pick up metolazone and explained how to take the medication - do not pick up midodrine as you  currently don't need it - has NP from [facility] coming to the home on 12/26/20 - tried to get through to [employer] and was directed to their call center who said they can't do anything until he sets up payment; encouraged him to discuss with PCP as well as [facility] NP to see if they can assist with this - tachycardic today but he hasn't been taking his carvedilol because he was afraid of making a mistake with his medications because of all the changes - BNP 12/12/20 was 240.8  2: HTN- - BP mildly elevated today but he hasn't taken anything except furosemide in the last 5 days since discharge - saw PCP ([facility]) 08/24/20; finally able to get through to the office and scheduled appt for tomorrow - BMP 12/16/20 reviewed and showed sodium 141, potassium 3.2, creatinine 1.74 and GFR 42 - resume enalapril '20mg'$  daily and carvedilol  3: DM- - saw nephrology [facility]) 09/01/20; returns later this month - A1c 12/10/20 was 6.8% - diet controlled  4: Paroxsymal atrial fibrillation- - saw cardiology ([facility]) 08/24/20; appt scheduled for 01/09/21  5: Lymphedema- - left shin has redness and slight warmth to it; seeing PCP tomorrow; may need antibiotic  - admits to not elevating his legs and he was instructed to elevate his legs when sitting for long periods of  time - has worn compression socks but says that they are a struggle to get them off - can use ACE wraps to wrap his legs with snug fit but not too tight - has used compression boots in the past with good results and explained that he could wear them 2 times/ a day for up to an hour each time - starting the twice weekly metolazone along with additional 91mq potassium with metolazone   Medication bottles reviewed.   Return in 2 weeks or sooner for any questions/problems before then.

## 2020-12-21 ENCOUNTER — Telehealth: Payer: Self-pay | Admitting: Family

## 2020-12-21 ENCOUNTER — Encounter: Payer: Self-pay | Admitting: Family

## 2020-12-21 ENCOUNTER — Ambulatory Visit: Payer: PPO | Attending: Family | Admitting: Family

## 2020-12-21 ENCOUNTER — Other Ambulatory Visit: Payer: Self-pay

## 2020-12-21 VITALS — BP 148/86 | HR 125 | Resp 18 | Ht 72.0 in | Wt 254.1 lb

## 2020-12-21 DIAGNOSIS — I890 Lymphedema, not elsewhere classified: Secondary | ICD-10-CM

## 2020-12-21 DIAGNOSIS — F419 Anxiety disorder, unspecified: Secondary | ICD-10-CM | POA: Insufficient documentation

## 2020-12-21 DIAGNOSIS — M79606 Pain in leg, unspecified: Secondary | ICD-10-CM | POA: Diagnosis not present

## 2020-12-21 DIAGNOSIS — I130 Hypertensive heart and chronic kidney disease with heart failure and stage 1 through stage 4 chronic kidney disease, or unspecified chronic kidney disease: Secondary | ICD-10-CM | POA: Diagnosis not present

## 2020-12-21 DIAGNOSIS — E1122 Type 2 diabetes mellitus with diabetic chronic kidney disease: Secondary | ICD-10-CM | POA: Diagnosis not present

## 2020-12-21 DIAGNOSIS — N1832 Chronic kidney disease, stage 3b: Secondary | ICD-10-CM | POA: Diagnosis not present

## 2020-12-21 DIAGNOSIS — N189 Chronic kidney disease, unspecified: Secondary | ICD-10-CM | POA: Diagnosis not present

## 2020-12-21 DIAGNOSIS — G479 Sleep disorder, unspecified: Secondary | ICD-10-CM | POA: Diagnosis not present

## 2020-12-21 DIAGNOSIS — R002 Palpitations: Secondary | ICD-10-CM | POA: Insufficient documentation

## 2020-12-21 DIAGNOSIS — R600 Localized edema: Secondary | ICD-10-CM | POA: Diagnosis not present

## 2020-12-21 DIAGNOSIS — Z880 Allergy status to penicillin: Secondary | ICD-10-CM | POA: Diagnosis not present

## 2020-12-21 DIAGNOSIS — I10 Essential (primary) hypertension: Secondary | ICD-10-CM | POA: Diagnosis not present

## 2020-12-21 DIAGNOSIS — R000 Tachycardia, unspecified: Secondary | ICD-10-CM | POA: Insufficient documentation

## 2020-12-21 DIAGNOSIS — I5032 Chronic diastolic (congestive) heart failure: Secondary | ICD-10-CM | POA: Diagnosis not present

## 2020-12-21 DIAGNOSIS — I480 Paroxysmal atrial fibrillation: Secondary | ICD-10-CM

## 2020-12-21 MED ORDER — ENALAPRIL MALEATE 20 MG PO TABS: 20.0000 mg | ORAL_TABLET | Freq: Every day | ORAL | 5 refills | Status: AC

## 2020-12-21 NOTE — Telephone Encounter (Signed)
Spoke to patient per Timothy Price, FNP and advised patient that on days he takes metolazone to take an extra potassium.   Digna Countess, NT

## 2020-12-21 NOTE — Patient Instructions (Addendum)
Continue weighing daily and call for an overnight weight gain of > 2 pounds or a weekly weight gain of >5 pounds.    Drink around 60 ounces of fluid daily.    Pick up metolazone from pharmacy (booster fluid pill). Take this on Mon & Thur and take it 1/2 hour before furosemide (lasix)   Elevate legs when sitting   Wrap legs with ACE bandage beginning at the ankle, make it snug.    Dr. Doy Hutching 12/22/20 at 2:15pm [facility] [place], [place], [place] [address] [phone]  Dr. Ubaldo Glassing 01/09/21 at 345pm  [facility] [place], [place], [place] [address] [phone]

## 2020-12-22 DIAGNOSIS — I480 Paroxysmal atrial fibrillation: Secondary | ICD-10-CM | POA: Diagnosis not present

## 2020-12-22 DIAGNOSIS — I5022 Chronic systolic (congestive) heart failure: Secondary | ICD-10-CM | POA: Diagnosis not present

## 2020-12-22 DIAGNOSIS — E118 Type 2 diabetes mellitus with unspecified complications: Secondary | ICD-10-CM | POA: Diagnosis not present

## 2020-12-22 DIAGNOSIS — I10 Essential (primary) hypertension: Secondary | ICD-10-CM | POA: Diagnosis not present

## 2020-12-22 DIAGNOSIS — E7800 Pure hypercholesterolemia, unspecified: Secondary | ICD-10-CM | POA: Diagnosis not present

## 2020-12-22 DIAGNOSIS — Z7901 Long term (current) use of anticoagulants: Secondary | ICD-10-CM | POA: Diagnosis not present

## 2020-12-22 DIAGNOSIS — I509 Heart failure, unspecified: Secondary | ICD-10-CM | POA: Diagnosis not present

## 2020-12-22 DIAGNOSIS — M7989 Other specified soft tissue disorders: Secondary | ICD-10-CM | POA: Diagnosis not present

## 2020-12-22 DIAGNOSIS — Z79899 Other long term (current) drug therapy: Secondary | ICD-10-CM | POA: Diagnosis not present

## 2020-12-22 DIAGNOSIS — N183 Chronic kidney disease, stage 3 unspecified: Secondary | ICD-10-CM | POA: Diagnosis not present

## 2020-12-22 DIAGNOSIS — I2510 Atherosclerotic heart disease of native coronary artery without angina pectoris: Secondary | ICD-10-CM | POA: Diagnosis not present

## 2020-12-23 ENCOUNTER — Other Ambulatory Visit: Payer: Self-pay | Admitting: Internal Medicine

## 2020-12-23 DIAGNOSIS — M7989 Other specified soft tissue disorders: Secondary | ICD-10-CM

## 2021-01-02 DIAGNOSIS — N183 Chronic kidney disease, stage 3 unspecified: Secondary | ICD-10-CM | POA: Diagnosis not present

## 2021-01-02 DIAGNOSIS — Z7901 Long term (current) use of anticoagulants: Secondary | ICD-10-CM | POA: Diagnosis not present

## 2021-01-03 ENCOUNTER — Ambulatory Visit
Admission: RE | Admit: 2021-01-03 | Discharge: 2021-01-03 | Disposition: A | Discharge status: Home / Self Care | Payer: PPO | Source: Ambulatory Visit | Attending: Internal Medicine | Admitting: Internal Medicine

## 2021-01-03 ENCOUNTER — Other Ambulatory Visit: Payer: Self-pay

## 2021-01-03 DIAGNOSIS — I5022 Chronic systolic (congestive) heart failure: Secondary | ICD-10-CM | POA: Diagnosis not present

## 2021-01-03 DIAGNOSIS — I10 Essential (primary) hypertension: Secondary | ICD-10-CM | POA: Diagnosis not present

## 2021-01-03 DIAGNOSIS — N2581 Secondary hyperparathyroidism of renal origin: Secondary | ICD-10-CM | POA: Diagnosis not present

## 2021-01-03 DIAGNOSIS — N1832 Chronic kidney disease, stage 3b: Secondary | ICD-10-CM | POA: Diagnosis not present

## 2021-01-03 DIAGNOSIS — M7989 Other specified soft tissue disorders: Secondary | ICD-10-CM | POA: Diagnosis not present

## 2021-01-04 ENCOUNTER — Ambulatory Visit: Payer: PPO | Attending: Family | Admitting: Family

## 2021-01-04 ENCOUNTER — Encounter: Payer: Self-pay | Admitting: Family

## 2021-01-04 VITALS — BP 112/78 | HR 93 | Resp 18 | Ht 72.0 in | Wt 222.4 lb

## 2021-01-04 DIAGNOSIS — I10 Essential (primary) hypertension: Secondary | ICD-10-CM

## 2021-01-04 DIAGNOSIS — E1122 Type 2 diabetes mellitus with diabetic chronic kidney disease: Secondary | ICD-10-CM | POA: Insufficient documentation

## 2021-01-04 DIAGNOSIS — I480 Paroxysmal atrial fibrillation: Secondary | ICD-10-CM | POA: Diagnosis not present

## 2021-01-04 DIAGNOSIS — J90 Pleural effusion, not elsewhere classified: Secondary | ICD-10-CM | POA: Diagnosis not present

## 2021-01-04 DIAGNOSIS — I130 Hypertensive heart and chronic kidney disease with heart failure and stage 1 through stage 4 chronic kidney disease, or unspecified chronic kidney disease: Secondary | ICD-10-CM | POA: Insufficient documentation

## 2021-01-04 DIAGNOSIS — I5032 Chronic diastolic (congestive) heart failure: Secondary | ICD-10-CM | POA: Insufficient documentation

## 2021-01-04 DIAGNOSIS — R609 Edema, unspecified: Secondary | ICD-10-CM | POA: Insufficient documentation

## 2021-01-04 DIAGNOSIS — I2510 Atherosclerotic heart disease of native coronary artery without angina pectoris: Secondary | ICD-10-CM | POA: Diagnosis not present

## 2021-01-04 DIAGNOSIS — N189 Chronic kidney disease, unspecified: Secondary | ICD-10-CM | POA: Insufficient documentation

## 2021-01-04 DIAGNOSIS — N1832 Chronic kidney disease, stage 3b: Secondary | ICD-10-CM

## 2021-01-04 DIAGNOSIS — R238 Other skin changes: Secondary | ICD-10-CM | POA: Diagnosis not present

## 2021-01-04 DIAGNOSIS — Z79899 Other long term (current) drug therapy: Secondary | ICD-10-CM | POA: Diagnosis not present

## 2021-01-04 DIAGNOSIS — Z7901 Long term (current) use of anticoagulants: Secondary | ICD-10-CM | POA: Insufficient documentation

## 2021-01-04 DIAGNOSIS — Z7982 Long term (current) use of aspirin: Secondary | ICD-10-CM | POA: Insufficient documentation

## 2021-01-04 NOTE — Progress Notes (Signed)
Patient ID: Timothy Chavez, male    DOB: Dec 16, 1950, 70 y.o.   MRN: [id number]  HPI  Mr Whisenant is a 70 y/o male with a history of HTN, CAD, CKD, atrial fibrillation, DM & chronic heart failure.   Echo report from 12/10/20 reviewed and showed an EF of 50-55% along with mild LVH.  Admitted 12/09/20 due to anasarca and pleural effusions. Initially required Levophed infusion for blood pressure support. Nephrology and pulmonology referrals made. ACE-I stopped due to hypotension and renal disease. Needs 2L oxygen along with trilogy. Beta-blocker increased. Discharged after 7 days.   He presents today for a follow-up visit with a chief complaint of minimal fatigue upon moderate exertion. He describes this as chronic in nature having been present for several years. He has associated pedal edema (although much improved), easy bruising, chronic difficulty sleeping and weight loss along with this. He denies any abdominal distention, palpitations, chest pain, shortness of breath, cough or dizziness.   Now taking metolazone twice a week along with additional potassium. Overall, feels "much better" than when he was last here. Did get treated with antibiotic for leg cellulitis since he was last here.   Past Medical History:  Diagnosis Date   A-fib [facility])    CHF (congestive heart failure) ([place])    Coronary artery disease    Diabetes mellitus without complication ([place])    Hypertension    Renal disorder    History reviewed. No pertinent surgical history. History reviewed. No pertinent family history. Social History   Tobacco Use   Smoking status: Never    Passive exposure: Never   Smokeless tobacco: Never  Substance Use Topics   Alcohol use: Not Currently   Allergies  Allergen Reactions   Penicillin V     Other reaction(s): Unknown Other reaction(s): Unknown As a child    Penicillins    Prior to Admission medications   Medication Sig Start Date End Date Taking? Authorizing Provider   acetaminophen (TYLENOL) 650 MG CR tablet Take 650 mg by mouth as needed for pain.   Yes [provider]  aspirin EC 81 MG tablet Take 81 mg by mouth daily. Swallow whole.   Yes [provider]  carvedilol (COREG) 25 MG tablet Take 25 mg by mouth 2 (two) times daily. 10/26/20  Yes [provider]  enalapril (VASOTEC) 20 MG tablet Take 1 tablet (20 mg total) by mouth daily. 12/21/20  Yes Antoino Westhoff, Otila Kluver A, FNP  furosemide (LASIX) 40 MG tablet Take 40 mg by mouth daily. 10/26/20  Yes [provider]  metolazone (ZAROXOLYN) 2.5 MG tablet One tablet every Mondays and Thursday morning only 12/16/20  Yes Cherene Altes, MD  potassium chloride SA (KLOR-CON) 20 MEQ tablet Take 20 mEq by mouth daily. And additional 47meq potassium with each dose of metolazone 10/26/20  Yes [provider]  pravastatin (PRAVACHOL) 20 MG tablet Take 20 mg by mouth daily as needed. 10/26/20  Yes [provider]  warfarin (COUMADIN) 4 MG tablet Take 4 mg by mouth daily. 10/26/20  Yes [provider]  calcitRIOL (ROCALTROL) 0.25 MCG capsule Take 1 capsule by mouth 3 (three) times a week. Patient not taking: Reported on 01/04/2021 12/06/20   [provider]   Review of Systems  Constitutional:  Positive for fatigue (minimal). Negative for appetite change.  HENT:  Negative for congestion, postnasal drip and sore throat.   Eyes: Negative.   Respiratory:  Negative for cough, chest tightness and shortness of breath.  Cardiovascular:  Positive for leg swelling. Negative for chest pain and palpitations.  Gastrointestinal:  Negative for abdominal distention and abdominal pain.  Endocrine: Negative.   Genitourinary: Negative.   Musculoskeletal:  Positive for arthralgias (legs). Negative for back pain.  Skin:  Positive for color change (left lower shin still slightly red but improved).  Allergic/Immunologic: Negative.   Neurological:  Negative for dizziness and  light-headedness.  Hematological:  Negative for adenopathy. Bruises/bleeds easily.  Psychiatric/Behavioral:  Positive for sleep disturbance (sleeping on 1 pillow). Negative for dysphoric mood. The patient is not nervous/anxious.    Vitals:   01/04/21 1116  BP: 112/78  Pulse: 93  Resp: 18  SpO2: 97%  Weight: 222 lb 6 oz (100.9 kg)  Height: 6' (1.829 m)   Wt Readings from Last 3 Encounters:  01/04/21 222 lb 6 oz (100.9 kg)  12/21/20 254 lb 2 oz (115.3 kg)  12/15/20 238 lb 8.6 oz (108.2 kg)   Lab Results  Component Value Date   CREATININE 1.74 (H) 12/16/2020   CREATININE 1.84 (H) 12/15/2020   CREATININE 2.48 (H) 12/13/2020    Physical Exam Vitals and nursing note reviewed.  Constitutional:      Appearance: Normal appearance.  HENT:     Head: Normocephalic and atraumatic.  Cardiovascular:     Rate and Rhythm: Normal rate and regular rhythm.  Pulmonary:     Effort: Pulmonary effort is normal. No respiratory distress.     Breath sounds: No wheezing or rales.  Abdominal:     General: There is no distension.     Palpations: Abdomen is soft.  Musculoskeletal:        General: No tenderness.     Cervical back: Normal range of motion and neck supple.     Right lower leg: Edema (1+ pitting) present.     Left lower leg: Edema (2+ pitting) present.  Skin:    Findings: Erythema (left lower leg although improved) present.  Neurological:     General: No focal deficit present.     Mental Status: He is alert and oriented to person, place, and time.  Psychiatric:        Mood and Affect: Mood normal.        Behavior: Behavior normal.        Thought Content: Thought content normal.   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)- - NYHA class II - euvolemic today - weighing daily; reminded to call for an overnight weight gain of > 2 pounds or a weekly weight gain of > 5 pounds - weight down 31 pounds from last visit  here 2 weeks ago - advised that if he loses another 5 pounds, to call and let me know as we will then decrease metolazone to once a week (currently taking it twice/ week) - consider changing his enalapril to entresto and adding SGLT@ - not adding salt but to a few foods; reviewed the importance of reading food labels for sodium content and to keep his daily sodium intake to 2000mg  / day - says that he's not "drinking very much"; explained that importance of keeping daily fluid intake to ~ 60 ounces/ day - NP from [facility] came to the home on 12/26/20 - he says that [employer] came and picked up his oxygen tanks - planning on getting his flu vaccine early October - BNP 12/12/20 was 240.8  2: HTN- - BP looks good today - saw PCP ([facility]) 12/22/20 -  BMP 01/02/21 reviewed and showed sodium 138, potassium 3.9, creatinine 1.9 and GFR 35  3: DM- - saw nephrology Holley Raring) 01/03/21 - A1c 12/10/20 was 6.8% - diet controlled  4: Paroxsymal atrial fibrillation- - saw cardiology (Fath) 08/24/20; returns 01/09/21   Medication bottles reviewed.   Return here in 1 month or sooner for any questions/problems before then.

## 2021-01-04 NOTE — Patient Instructions (Signed)
Continue weighing daily and call for an overnight weight gain of > 2 pounds or a weekly weight gain of >5 pounds. 

## 2021-01-09 DIAGNOSIS — I429 Cardiomyopathy, unspecified: Secondary | ICD-10-CM | POA: Diagnosis not present

## 2021-01-09 DIAGNOSIS — R791 Abnormal coagulation profile: Secondary | ICD-10-CM | POA: Diagnosis not present

## 2021-01-09 DIAGNOSIS — I480 Paroxysmal atrial fibrillation: Secondary | ICD-10-CM | POA: Diagnosis not present

## 2021-01-09 DIAGNOSIS — I5022 Chronic systolic (congestive) heart failure: Secondary | ICD-10-CM | POA: Diagnosis not present

## 2021-01-09 DIAGNOSIS — I200 Unstable angina: Secondary | ICD-10-CM | POA: Diagnosis not present

## 2021-01-09 DIAGNOSIS — I10 Essential (primary) hypertension: Secondary | ICD-10-CM | POA: Diagnosis not present

## 2021-01-09 DIAGNOSIS — Z7901 Long term (current) use of anticoagulants: Secondary | ICD-10-CM | POA: Diagnosis not present

## 2021-01-09 DIAGNOSIS — I2510 Atherosclerotic heart disease of native coronary artery without angina pectoris: Secondary | ICD-10-CM | POA: Diagnosis not present

## 2021-01-19 DIAGNOSIS — I2510 Atherosclerotic heart disease of native coronary artery without angina pectoris: Secondary | ICD-10-CM | POA: Diagnosis not present

## 2021-01-19 DIAGNOSIS — I480 Paroxysmal atrial fibrillation: Secondary | ICD-10-CM | POA: Diagnosis not present

## 2021-01-19 DIAGNOSIS — E782 Mixed hyperlipidemia: Secondary | ICD-10-CM | POA: Diagnosis not present

## 2021-01-19 DIAGNOSIS — I10 Essential (primary) hypertension: Secondary | ICD-10-CM | POA: Diagnosis not present

## 2021-01-19 DIAGNOSIS — Z79899 Other long term (current) drug therapy: Secondary | ICD-10-CM | POA: Diagnosis not present

## 2021-01-19 DIAGNOSIS — N183 Chronic kidney disease, stage 3 unspecified: Secondary | ICD-10-CM | POA: Diagnosis not present

## 2021-01-19 DIAGNOSIS — Z7901 Long term (current) use of anticoagulants: Secondary | ICD-10-CM | POA: Diagnosis not present

## 2021-01-19 DIAGNOSIS — E118 Type 2 diabetes mellitus with unspecified complications: Secondary | ICD-10-CM | POA: Diagnosis not present

## 2021-02-01 NOTE — Progress Notes (Signed)
Patient ID: Timothy Chavez, male    DOB: 1951/03/30, 70 y.o.   MRN: [id number]  HPI  Timothy Chavez is a 70 y/o male with a history of HTN, CAD, CKD, atrial fibrillation, DM & chronic heart failure.   Echo report from 12/10/20 reviewed and showed an EF of 50-55% along with mild LVH.  Admitted 12/09/20 due to anasarca and pleural effusions. Initially required Levophed infusion for blood pressure support. Nephrology and pulmonology referrals made. ACE-I stopped due to hypotension and renal disease. Needs 2L oxygen along with trilogy. Beta-blocker increased. Discharged after 7 days.   He presents today for a follow-up visit with a chief complaint of minimal fatigue upon moderate exertion. He describes this as chronic in nature having been present for several years. He has associated pedal edema & leg pain along with this. He denies any difficulty sleeping, abdominal distention, palpitations, chest pain, shortness of breath, cough, dizziness or weight gain.   Has received his flu vaccine and recent covid booster.   Past Medical History:  Diagnosis Date   A-fib [facility])    CHF (congestive heart failure) ([place])    Coronary artery disease    Diabetes mellitus without complication ([place])    Hypertension    Renal disorder    No past surgical history on file. No family history on file. Social History   Tobacco Use   Smoking status: Never    Passive exposure: Never   Smokeless tobacco: Never  Substance Use Topics   Alcohol use: Not Currently   Allergies  Allergen Reactions   Penicillin V     Other reaction(s): Unknown Other reaction(s): Unknown As a child    Penicillins    Prior to Admission medications   Medication Sig Start Date End Date Taking? Authorizing Provider  acetaminophen (TYLENOL) 650 MG CR tablet Take 650 mg by mouth as needed for pain.   Yes [provider]  aspirin EC 81 MG tablet Take 81 mg by mouth daily. Swallow whole.   Yes [provider]  carvedilol  (COREG) 25 MG tablet Take 25 mg by mouth 2 (two) times daily. 10/26/20  Yes [provider]  enalapril (VASOTEC) 20 MG tablet Take 1 tablet (20 mg total) by mouth daily. 12/21/20  Yes Tamiah Dysart, Otila Kluver A, FNP  furosemide (LASIX) 40 MG tablet Take 40 mg by mouth daily. 10/26/20  Yes [provider]  metolazone (ZAROXOLYN) 2.5 MG tablet One tablet every Mondays and Thursday morning only 12/16/20  Yes Cherene Altes, MD  potassium chloride SA (KLOR-CON) 20 MEQ tablet Take 20 mEq by mouth daily. And additional 62meq potassium with each dose of metolazone 10/26/20  Yes [provider]  pravastatin (PRAVACHOL) 20 MG tablet Take 20 mg by mouth daily as needed. 10/26/20  Yes [provider]  warfarin (COUMADIN) 4 MG tablet Take 4 mg by mouth daily. 10/26/20  Yes [provider]    Review of Systems  Constitutional:  Positive for fatigue (minimal). Negative for appetite change.  HENT:  Negative for congestion, postnasal drip and sore throat.   Eyes: Negative.   Respiratory:  Negative for cough, chest tightness and shortness of breath.   Cardiovascular:  Positive for leg swelling. Negative for chest pain and palpitations.  Gastrointestinal:  Negative for abdominal distention and abdominal pain.  Endocrine: Negative.   Genitourinary: Negative.   Musculoskeletal:  Positive for arthralgias (legs). Negative for back pain.  Skin: Negative.   Allergic/Immunologic: Negative.   Neurological:  Negative for  dizziness and light-headedness.  Hematological:  Negative for adenopathy. Bruises/bleeds easily.  Psychiatric/Behavioral:  Negative for dysphoric mood and sleep disturbance (sleeping on 1 pillow). The patient is not nervous/anxious.    Vitals:   02/02/21 0847  BP: 138/64  Pulse: 68  Resp: 20  SpO2: 100%  Weight: 226 lb (102.5 kg)  Height: 5\' 11"  (1.803 m)   Wt Readings from Last 3 Encounters:  02/02/21 226 lb (102.5 kg)  01/04/21 222 lb 6 oz (100.9 kg)   12/21/20 254 lb 2 oz (115.3 kg)   Lab Results  Component Value Date   CREATININE 1.74 (H) 12/16/2020   CREATININE 1.84 (H) 12/15/2020   CREATININE 2.48 (H) 12/13/2020    Physical Exam Vitals and nursing note reviewed.  Constitutional:      Appearance: Normal appearance.  HENT:     Head: Normocephalic and atraumatic.  Cardiovascular:     Rate and Rhythm: Normal rate and regular rhythm.  Pulmonary:     Effort: Pulmonary effort is normal. No respiratory distress.     Breath sounds: No wheezing or rales.  Abdominal:     General: There is no distension.     Palpations: Abdomen is soft.  Musculoskeletal:        General: No tenderness.     Cervical back: Normal range of motion and neck supple.     Right lower leg: Edema (1+ pitting) present.     Left lower leg: Edema (2+ pitting) present.  Skin:    General: Skin is warm and dry.     Findings: No erythema.  Neurological:     General: No focal deficit present.     Mental Status: He is alert and oriented to person, place, and time.  Psychiatric:        Mood and Affect: Mood normal.        Behavior: Behavior normal.        Thought Content: Thought content normal.   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)- - NYHA class II - euvolemic today - weighing daily; reminded to call for an overnight weight gain of > 2 pounds or a weekly weight gain of > 5 pounds - weight up 4 pounds from last visit here 1 month ago - will decrease metolazone to once/ week - plan to change his enalapril to entresto at his next visit as he recently picked up a 90 day supply of enalapril - consider adding jardiance at future visit - not adding salt but to a few foods; reviewed the importance of reading food labels for sodium content and to keep his daily sodium intake to 2000mg  / day - advised patient to get compression socks and put them on every morning with removal at bedtime; he says  that he had some but they were too tight so he needs to get a lesser compression - has received his flu vaccine for this season - has received his newest covid booster - BNP 12/12/20 was 240.8  2: HTN- - BP looks good today - saw PCP ([facility]) 01/19/21; returns November - BMP 01/03/21 reviewed and showed sodium 136, potassium 3.6, creatinine 2.04 and GFR 35  3: DM- - saw nephrology Holley Raring) 01/03/21 - A1c 12/10/20 was 6.8% - diet controlled  4: Paroxsymal atrial fibrillation- - saw cardiology (Fath) 01/09/21; returns December    Medication bottles reviewed.   Return in 3 months or sooner for any questions/problems before then.

## 2021-02-02 ENCOUNTER — Other Ambulatory Visit: Payer: Self-pay

## 2021-02-02 ENCOUNTER — Encounter: Payer: Self-pay | Admitting: Family

## 2021-02-02 ENCOUNTER — Ambulatory Visit: Payer: PPO | Attending: Family | Admitting: Family

## 2021-02-02 VITALS — BP 138/64 | HR 68 | Resp 20 | Ht 71.0 in | Wt 226.0 lb

## 2021-02-02 DIAGNOSIS — I10 Essential (primary) hypertension: Secondary | ICD-10-CM

## 2021-02-02 DIAGNOSIS — I2510 Atherosclerotic heart disease of native coronary artery without angina pectoris: Secondary | ICD-10-CM | POA: Diagnosis not present

## 2021-02-02 DIAGNOSIS — I5032 Chronic diastolic (congestive) heart failure: Secondary | ICD-10-CM | POA: Diagnosis not present

## 2021-02-02 DIAGNOSIS — I130 Hypertensive heart and chronic kidney disease with heart failure and stage 1 through stage 4 chronic kidney disease, or unspecified chronic kidney disease: Secondary | ICD-10-CM | POA: Diagnosis not present

## 2021-02-02 DIAGNOSIS — I480 Paroxysmal atrial fibrillation: Secondary | ICD-10-CM | POA: Diagnosis not present

## 2021-02-02 DIAGNOSIS — E118 Type 2 diabetes mellitus with unspecified complications: Secondary | ICD-10-CM

## 2021-02-02 DIAGNOSIS — Z880 Allergy status to penicillin: Secondary | ICD-10-CM | POA: Diagnosis not present

## 2021-02-02 DIAGNOSIS — N189 Chronic kidney disease, unspecified: Secondary | ICD-10-CM | POA: Insufficient documentation

## 2021-02-02 DIAGNOSIS — E1122 Type 2 diabetes mellitus with diabetic chronic kidney disease: Secondary | ICD-10-CM | POA: Insufficient documentation

## 2021-02-02 NOTE — Patient Instructions (Addendum)
Continue weighing daily and call for an overnight weight gain of > 2 pounds or a weekly weight gain of >5 pounds.    Decrease metolazone to once a week.

## 2021-02-09 ENCOUNTER — Other Ambulatory Visit: Payer: Self-pay | Admitting: Family

## 2021-02-09 MED ORDER — METOLAZONE 2.5 MG PO TABS: 2.5000 mg | ORAL_TABLET | ORAL | 3 refills | Status: DC

## 2021-02-09 NOTE — Progress Notes (Signed)
Metolazone RX sent to pharmacy

## 2021-02-22 DIAGNOSIS — E118 Type 2 diabetes mellitus with unspecified complications: Secondary | ICD-10-CM | POA: Diagnosis not present

## 2021-02-22 DIAGNOSIS — Z79899 Other long term (current) drug therapy: Secondary | ICD-10-CM | POA: Diagnosis not present

## 2021-02-22 DIAGNOSIS — E782 Mixed hyperlipidemia: Secondary | ICD-10-CM | POA: Diagnosis not present

## 2021-02-22 DIAGNOSIS — I10 Essential (primary) hypertension: Secondary | ICD-10-CM | POA: Diagnosis not present

## 2021-02-22 DIAGNOSIS — I480 Paroxysmal atrial fibrillation: Secondary | ICD-10-CM | POA: Diagnosis not present

## 2021-02-23 DIAGNOSIS — E1159 Type 2 diabetes mellitus with other circulatory complications: Secondary | ICD-10-CM | POA: Diagnosis not present

## 2021-02-23 DIAGNOSIS — Z6831 Body mass index (BMI) 31.0-31.9, adult: Secondary | ICD-10-CM | POA: Diagnosis not present

## 2021-02-23 DIAGNOSIS — I509 Heart failure, unspecified: Secondary | ICD-10-CM | POA: Diagnosis not present

## 2021-02-27 DIAGNOSIS — Z0289 Encounter for other administrative examinations: Secondary | ICD-10-CM | POA: Diagnosis not present

## 2021-03-06 DIAGNOSIS — I480 Paroxysmal atrial fibrillation: Secondary | ICD-10-CM | POA: Diagnosis not present

## 2021-03-16 DIAGNOSIS — I480 Paroxysmal atrial fibrillation: Secondary | ICD-10-CM | POA: Diagnosis not present

## 2021-03-16 DIAGNOSIS — I5022 Chronic systolic (congestive) heart failure: Secondary | ICD-10-CM | POA: Diagnosis not present

## 2021-03-16 DIAGNOSIS — I200 Unstable angina: Secondary | ICD-10-CM | POA: Diagnosis not present

## 2021-03-16 DIAGNOSIS — I429 Cardiomyopathy, unspecified: Secondary | ICD-10-CM | POA: Diagnosis not present

## 2021-03-16 DIAGNOSIS — I2510 Atherosclerotic heart disease of native coronary artery without angina pectoris: Secondary | ICD-10-CM | POA: Diagnosis not present

## 2021-03-16 DIAGNOSIS — E782 Mixed hyperlipidemia: Secondary | ICD-10-CM | POA: Diagnosis not present

## 2021-03-16 DIAGNOSIS — Z7901 Long term (current) use of anticoagulants: Secondary | ICD-10-CM | POA: Diagnosis not present

## 2021-03-16 DIAGNOSIS — I10 Essential (primary) hypertension: Secondary | ICD-10-CM | POA: Diagnosis not present

## 2021-04-18 DIAGNOSIS — I480 Paroxysmal atrial fibrillation: Secondary | ICD-10-CM | POA: Diagnosis not present

## 2021-05-01 DIAGNOSIS — I10 Essential (primary) hypertension: Secondary | ICD-10-CM | POA: Diagnosis not present

## 2021-05-01 DIAGNOSIS — Z79899 Other long term (current) drug therapy: Secondary | ICD-10-CM | POA: Diagnosis not present

## 2021-05-01 DIAGNOSIS — E118 Type 2 diabetes mellitus with unspecified complications: Secondary | ICD-10-CM | POA: Diagnosis not present

## 2021-05-01 DIAGNOSIS — I5022 Chronic systolic (congestive) heart failure: Secondary | ICD-10-CM | POA: Diagnosis not present

## 2021-05-01 DIAGNOSIS — E7800 Pure hypercholesterolemia, unspecified: Secondary | ICD-10-CM | POA: Diagnosis not present

## 2021-05-01 DIAGNOSIS — N183 Chronic kidney disease, stage 3 unspecified: Secondary | ICD-10-CM | POA: Diagnosis not present

## 2021-05-01 DIAGNOSIS — I480 Paroxysmal atrial fibrillation: Secondary | ICD-10-CM | POA: Diagnosis not present

## 2021-05-05 ENCOUNTER — Ambulatory Visit: Payer: PPO | Attending: Family | Admitting: Family

## 2021-05-05 ENCOUNTER — Other Ambulatory Visit: Payer: Self-pay

## 2021-05-05 ENCOUNTER — Encounter: Payer: Self-pay | Admitting: Family

## 2021-05-05 VITALS — BP 122/76 | HR 94 | Resp 20 | Ht 71.0 in | Wt 226.0 lb

## 2021-05-05 DIAGNOSIS — I10 Essential (primary) hypertension: Secondary | ICD-10-CM

## 2021-05-05 DIAGNOSIS — I130 Hypertensive heart and chronic kidney disease with heart failure and stage 1 through stage 4 chronic kidney disease, or unspecified chronic kidney disease: Secondary | ICD-10-CM | POA: Insufficient documentation

## 2021-05-05 DIAGNOSIS — I5032 Chronic diastolic (congestive) heart failure: Secondary | ICD-10-CM | POA: Diagnosis not present

## 2021-05-05 DIAGNOSIS — N189 Chronic kidney disease, unspecified: Secondary | ICD-10-CM | POA: Diagnosis not present

## 2021-05-05 DIAGNOSIS — I480 Paroxysmal atrial fibrillation: Secondary | ICD-10-CM | POA: Diagnosis not present

## 2021-05-05 DIAGNOSIS — E1122 Type 2 diabetes mellitus with diabetic chronic kidney disease: Secondary | ICD-10-CM | POA: Insufficient documentation

## 2021-05-05 DIAGNOSIS — E118 Type 2 diabetes mellitus with unspecified complications: Secondary | ICD-10-CM | POA: Diagnosis not present

## 2021-05-05 DIAGNOSIS — R609 Edema, unspecified: Secondary | ICD-10-CM | POA: Insufficient documentation

## 2021-05-05 NOTE — Patient Instructions (Addendum)
Continue weighing daily and call for an overnight weight gain of 3 pounds or more or a weekly weight gain of more than 5 pounds.   The [facility] will be moving around the corner to [id number] mid-February. Our phone number will remain the same   Talk with Dr. Holley Raring about replacing enapril with entresto

## 2021-05-05 NOTE — Progress Notes (Signed)
Patient ID: Timothy Chavez, male    DOB: [birth year], 71 y.o.   MRN: [id number]  HPI  Mr Matuszak is a 71 y/o male with a history of HTN, CAD, CKD, atrial fibrillation, DM & chronic heart failure.   Echo report from 12/10/20 reviewed and showed an EF of 50-55% along with mild LVH.  Admitted 12/09/20 due to anasarca and pleural effusions. Initially required Levophed infusion for blood pressure support. Nephrology and pulmonology referrals made. ACE-I stopped due to hypotension and renal disease. Needs 2L oxygen along with trilogy. Beta-blocker increased. Discharged after 7 days.   He presents today for a follow-up visit with a chief complaint of minimal fatigue upon moderate exertion. He describes this as chronic in nature having been present for several years. He has associated shortness of breath, pedal edema, easy bruising and leg pain along with this. He denies any difficulty sleeping, dizziness, abdominal distention, palpitations, chest pain, cough or weight gain.   Going to the gym 2-3 days/ week and either walking on the treadmill or riding bicycle.   Past Medical History:  Diagnosis Date   A-fib [facility])    CHF (congestive heart failure) ([place])    Coronary artery disease    Diabetes mellitus without complication ([place])    Hypertension    Renal disorder    No past surgical history on file. No family history on file. Social History   Tobacco Use   Smoking status: Never    Passive exposure: Never   Smokeless tobacco: Never  Substance Use Topics   Alcohol use: Not Currently   Allergies  Allergen Reactions   Penicillin V     Other reaction(s): Unknown Other reaction(s): Unknown As a child    Penicillins    Prior to Admission medications   Medication Sig Start Date End Date Taking? Authorizing Provider  aspirin EC 81 MG tablet Take 81 mg by mouth daily. Swallow whole.   Yes [provider]  carvedilol (COREG) 25 MG tablet Take 25 mg by mouth 2 (two) times daily. 10/26/20   Yes [provider]  enalapril (VASOTEC) 20 MG tablet Take 1 tablet (20 mg total) by mouth daily. 12/21/20  Yes Destane Speas, Otila Kluver A, FNP  furosemide (LASIX) 40 MG tablet Take 40 mg by mouth daily. 10/26/20  Yes [provider]  metolazone (ZAROXOLYN) 2.5 MG tablet Take 1 tablet (2.5 mg total) by mouth once a week. 02/09/21  Yes Dyann Goodspeed, Otila Kluver A, FNP  potassium chloride SA (KLOR-CON) 20 MEQ tablet Take 20 mEq by mouth daily. And additional 13meq potassium with each dose of metolazone 10/26/20  Yes [provider]  pravastatin (PRAVACHOL) 20 MG tablet Take 20 mg by mouth daily as needed. 10/26/20  Yes [provider]  warfarin (COUMADIN) 4 MG tablet Take 4 mg by mouth daily. 10/26/20  Yes [provider]  acetaminophen (TYLENOL) 650 MG CR tablet Take 650 mg by mouth as needed for pain. Patient not taking: Reported on 05/05/2021    [provider]   Review of Systems  Constitutional:  Positive for fatigue (minimal). Negative for appetite change.  HENT:  Negative for congestion, postnasal drip and sore throat.   Eyes: Negative.   Respiratory:  Positive for shortness of breath (minimal). Negative for cough and chest tightness.   Cardiovascular:  Positive for leg swelling. Negative for chest pain and palpitations.  Gastrointestinal:  Negative for abdominal distention and abdominal pain.  Endocrine: Negative.   Genitourinary: Negative.   Musculoskeletal:  Positive  for arthralgias (legs). Negative for back pain.  Skin: Negative.   Allergic/Immunologic: Negative.   Neurological:  Negative for dizziness and light-headedness.  Hematological:  Negative for adenopathy. Bruises/bleeds easily.  Psychiatric/Behavioral:  Negative for dysphoric mood and sleep disturbance (sleeping on 1 pillow). The patient is not nervous/anxious.    Vitals:   05/05/21 0922  BP: 122/76  Pulse: 94  Resp: 20  SpO2: 97%  Weight: 226 lb (102.5 kg)  Height: 5\' 11"  (1.803 m)   Wt  Readings from Last 3 Encounters:  05/05/21 226 lb (102.5 kg)  02/02/21 226 lb (102.5 kg)  01/04/21 222 lb 6 oz (100.9 kg)   Lab Results  Component Value Date   CREATININE 1.74 (H) 12/16/2020   CREATININE 1.84 (H) 12/15/2020   CREATININE 2.48 (H) 12/13/2020   Physical Exam Vitals and nursing note reviewed.  Constitutional:      Appearance: Normal appearance.  HENT:     Head: Normocephalic and atraumatic.  Cardiovascular:     Rate and Rhythm: Normal rate and regular rhythm.  Pulmonary:     Effort: Pulmonary effort is normal. No respiratory distress.     Breath sounds: No wheezing or rales.  Abdominal:     General: There is no distension.     Palpations: Abdomen is soft.  Musculoskeletal:        General: No tenderness.     Cervical back: Normal range of motion and neck supple.     Right lower leg: Edema (trace pitting) present.     Left lower leg: No edema.  Skin:    General: Skin is warm and dry.     Findings: No erythema.  Neurological:     General: No focal deficit present.     Mental Status: He is alert and oriented to person, place, and time.  Psychiatric:        Mood and Affect: Mood normal.        Behavior: Behavior normal.        Thought Content: Thought content normal.   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)- - NYHA class II - euvolemic today - weighing daily; reminded to call for an overnight weight gain of > 2 pounds or a weekly weight gain of > 5 pounds - weight unchanged from last visit here 3 months ago - taking metolazone weekly now - discussed, again, changing his enalapril to entresto but he recently got an auto shipment of enalapril so has another 90 day supply; he would also like to discuss this with nephrology first - consider adding jardiance at future visit - not adding salt but to a few foods; reviewed the importance of reading food labels for sodium content and to keep his  daily sodium intake to 2000mg  / day - wearing compression socks daily with improvement of edema - has received his flu vaccine for this season - has received his newest covid booster - BNP 12/12/20 was 240.8  2: HTN- - BP looks good (122/76) - saw PCP ([facility]) 01/19/21 - BMP 01/03/21 reviewed and showed sodium 136, potassium 3.6, creatinine 2.04 and GFR 35  3: DM- - saw nephrology Holley Raring) 01/03/21; returns 05/08/21 - will message nephrology regarding entresto use - A1c 12/10/20 was 6.8% - diet controlled  4: Paroxsymal atrial fibrillation- - saw cardiology (Fath) 01/09/21   Medication bottles reviewed.   Return in 3 months, sooner if needed.

## 2021-05-08 DIAGNOSIS — I10 Essential (primary) hypertension: Secondary | ICD-10-CM | POA: Diagnosis not present

## 2021-05-08 DIAGNOSIS — N2581 Secondary hyperparathyroidism of renal origin: Secondary | ICD-10-CM | POA: Diagnosis not present

## 2021-05-08 DIAGNOSIS — D631 Anemia in chronic kidney disease: Secondary | ICD-10-CM | POA: Diagnosis not present

## 2021-05-08 DIAGNOSIS — I5022 Chronic systolic (congestive) heart failure: Secondary | ICD-10-CM | POA: Diagnosis not present

## 2021-05-08 DIAGNOSIS — N1832 Chronic kidney disease, stage 3b: Secondary | ICD-10-CM | POA: Diagnosis not present

## 2021-05-25 DIAGNOSIS — N481 Balanitis: Secondary | ICD-10-CM | POA: Diagnosis not present

## 2021-05-25 DIAGNOSIS — I480 Paroxysmal atrial fibrillation: Secondary | ICD-10-CM | POA: Diagnosis not present

## 2021-05-25 DIAGNOSIS — I10 Essential (primary) hypertension: Secondary | ICD-10-CM | POA: Diagnosis not present

## 2021-05-25 DIAGNOSIS — R21 Rash and other nonspecific skin eruption: Secondary | ICD-10-CM | POA: Diagnosis not present

## 2021-05-25 DIAGNOSIS — N1832 Chronic kidney disease, stage 3b: Secondary | ICD-10-CM | POA: Diagnosis not present

## 2021-06-06 ENCOUNTER — Other Ambulatory Visit: Payer: Self-pay

## 2021-06-06 ENCOUNTER — Encounter: Payer: Self-pay | Admitting: Urology

## 2021-06-06 ENCOUNTER — Other Ambulatory Visit: Payer: Self-pay | Admitting: *Deleted

## 2021-06-06 ENCOUNTER — Ambulatory Visit (INDEPENDENT_AMBULATORY_CARE_PROVIDER_SITE_OTHER): Payer: PPO | Admitting: Urology

## 2021-06-06 VITALS — BP 133/77 | HR 112 | Ht 71.0 in | Wt 226.0 lb

## 2021-06-06 DIAGNOSIS — N481 Balanitis: Secondary | ICD-10-CM | POA: Diagnosis not present

## 2021-06-06 DIAGNOSIS — I480 Paroxysmal atrial fibrillation: Secondary | ICD-10-CM | POA: Diagnosis not present

## 2021-06-06 MED ORDER — CLOTRIMAZOLE-BETAMETHASONE 1-0.05 % EX CREA: 1.0000 "application " | TOPICAL_CREAM | Freq: Two times a day (BID) | CUTANEOUS | 0 refills | Status: DC

## 2021-06-06 NOTE — Progress Notes (Signed)
° °  06/06/21 1:08 PM   Timothy Chavez 13-Dec-1950 [id number]  CC: Penile rash/balanitis  HPI: 71 year old very comorbid male with CAD, CHF, diabetes, hypertension who was recently started on Farxiga and developed a significant rash over his chest, as well as significant swelling of the glans and shaft.  He is circumcised.  He was treated with 2 doses of fluconazole through his nephrologist, and Farxiga discontinued.  He reports the rash has improved significantly since that time.  He denies any gross hematuria, and urinary symptoms have improved since taking the fluconazole.  He was unable to void for urinalysis today.    PMH: Past Medical History:  Diagnosis Date   A-fib ([place])    CHF (congestive heart failure) ([place])    Coronary artery disease    Diabetes mellitus without complication ([place])    Hypertension    Renal disorder     Family History: No family history on file.  Social History:  reports that he has never smoked. He has never been exposed to tobacco smoke. He has never used smokeless tobacco. He reports that he does not currently use alcohol. He reports that he does not currently use drugs.  Physical Exam: BP 133/77    Pulse (!) 112    Ht 5\' 11"  (1.803 m)    Wt 226 lb (102.5 kg)    BMI 31.52 kg/m    Constitutional:  Alert and oriented, No acute distress. Cardiovascular: No clubbing, cyanosis, or edema. Respiratory: Normal respiratory effort, no increased work of breathing. GI: Abdomen is soft, nontender, nondistended, no abdominal masses GU: Circumcised phallus with significant edema and erythema of the glans and distal shaft skin, as well as some open sores around the corona, and a single open sore adjacent to the glans  Assessment & Plan:   71 year old male with significant balanitis/allergic reaction after [place].  Treated with fluconazole x2 doses and [place] was stopped and his symptoms have improved significantly since that time.  On exam though he still has some  significant edema and erythema, and I recommended a 2-week course of Lotrisone cream twice daily.  Return precautions discussed extensively  Lotrisone cream twice daily x2 weeks RTC 4 to 6 weeks symptom check for resolution  Nickolas Madrid, MD 06/06/2021  [facility] [address], [place], [place] [address] [phone]

## 2021-06-14 DIAGNOSIS — I5022 Chronic systolic (congestive) heart failure: Secondary | ICD-10-CM | POA: Diagnosis not present

## 2021-06-14 DIAGNOSIS — I10 Essential (primary) hypertension: Secondary | ICD-10-CM | POA: Diagnosis not present

## 2021-06-14 DIAGNOSIS — I480 Paroxysmal atrial fibrillation: Secondary | ICD-10-CM | POA: Diagnosis not present

## 2021-06-14 DIAGNOSIS — I429 Cardiomyopathy, unspecified: Secondary | ICD-10-CM | POA: Diagnosis not present

## 2021-06-14 DIAGNOSIS — Z7901 Long term (current) use of anticoagulants: Secondary | ICD-10-CM | POA: Diagnosis not present

## 2021-06-22 DIAGNOSIS — Z7901 Long term (current) use of anticoagulants: Secondary | ICD-10-CM | POA: Diagnosis not present

## 2021-06-28 ENCOUNTER — Telehealth: Payer: Self-pay | Admitting: *Deleted

## 2021-06-28 DIAGNOSIS — N2581 Secondary hyperparathyroidism of renal origin: Secondary | ICD-10-CM | POA: Diagnosis not present

## 2021-06-28 DIAGNOSIS — I509 Heart failure, unspecified: Secondary | ICD-10-CM | POA: Diagnosis not present

## 2021-06-28 DIAGNOSIS — Z6836 Body mass index (BMI) 36.0-36.9, adult: Secondary | ICD-10-CM | POA: Diagnosis not present

## 2021-06-28 DIAGNOSIS — Z7901 Long term (current) use of anticoagulants: Secondary | ICD-10-CM | POA: Diagnosis not present

## 2021-06-28 DIAGNOSIS — D6869 Other thrombophilia: Secondary | ICD-10-CM | POA: Diagnosis not present

## 2021-06-28 DIAGNOSIS — E1122 Type 2 diabetes mellitus with diabetic chronic kidney disease: Secondary | ICD-10-CM | POA: Diagnosis not present

## 2021-06-28 DIAGNOSIS — N1831 Chronic kidney disease, stage 3a: Secondary | ICD-10-CM | POA: Diagnosis not present

## 2021-06-28 DIAGNOSIS — E1159 Type 2 diabetes mellitus with other circulatory complications: Secondary | ICD-10-CM | POA: Diagnosis not present

## 2021-06-28 DIAGNOSIS — I4891 Unspecified atrial fibrillation: Secondary | ICD-10-CM | POA: Diagnosis not present

## 2021-06-28 NOTE — Telephone Encounter (Signed)
Okay to discontinue cream, and keep follow-up as scheduled for repeat exam ? ?Timothy Madrid, MD ?06/28/2021 ? ?

## 2021-06-28 NOTE — Addendum Note (Signed)
Addended by: Donalee Citrin on: 06/28/2021 01:34 PM ? ? Modules accepted: Orders ? ?

## 2021-06-28 NOTE — Telephone Encounter (Signed)
Called pt informed him of the information below. Pt voiced understanding.  

## 2021-06-28 NOTE — Telephone Encounter (Signed)
Patient called in today and states the cream is not working. He thinks the penis area is more redder than before he started using the cream  ?

## 2021-06-29 DIAGNOSIS — Z7901 Long term (current) use of anticoagulants: Secondary | ICD-10-CM | POA: Diagnosis not present

## 2021-07-06 DIAGNOSIS — Z7901 Long term (current) use of anticoagulants: Secondary | ICD-10-CM | POA: Diagnosis not present

## 2021-07-18 ENCOUNTER — Encounter: Payer: Self-pay | Admitting: Urology

## 2021-07-18 ENCOUNTER — Ambulatory Visit: Payer: PPO | Admitting: Urology

## 2021-07-18 VITALS — BP 116/77 | HR 118 | Ht 71.0 in | Wt 222.6 lb

## 2021-07-18 DIAGNOSIS — R21 Rash and other nonspecific skin eruption: Secondary | ICD-10-CM

## 2021-07-18 NOTE — Progress Notes (Signed)
? ?  07/18/2021 ?2:57 PM  ? ?Timothy Chavez ?[birth year] ?[id number] ? ?Reason for visit: Follow up penile rash/balanitis ? ?HPI: ?Comorbid 71 year old male who was started on Farxiga by PCP and developed a significant rash on his chest as well as some significant swelling of the penile glans and shaft.  He was treated with 2 doses of fluconazole through his nephrologist and rash had been slowly improving.  We opted for a trial of Lotrisone cream at that time.  He reports that his rash, swelling, and penile erythema has now almost completely resolved.  It took a few weeks after he finished the cream before things had totally resolved.  I suspect this was related to an allergic reaction from the [place], especially in the setting of the simultaneous rash over his chest. ? ?On exam, phallus with patent meatus, some mild blanching of the glans but otherwise appears to have healed significantly from prior exam ? ?Follow-up with urology as needed ? ?Billey Co, MD ? ?[facility] ?[address] ?[place], [place] [address] ?[phone] ? ?

## 2021-07-22 NOTE — Progress Notes (Signed)
? Patient ID: Timothy Chavez, male    DOB: 06-10-1950, 71 y.o.   MRN: [id number] ? ?HPI ? ?Timothy Chavez is a 71 y/o male with a history of HTN, CAD, CKD, atrial fibrillation, DM & chronic heart failure.  ? ?Echo report from 12/10/20 reviewed and showed an EF of 50-55% along with mild LVH. ? ?Has not been admitted or been in the ED in the last 6 months.  ? ?He presents today for a follow-up visit with a chief complaint of minimal shortness of breath upon moderate exertion. He describes this as chronic in nature having been present for several years. He has associated fatigue, pedal edema, easy bruising, leg pain and resolving rash along with this. Denies any difficulty sleeping, dizziness, abdominal distention, palpitations, chest pain, cough or weight gain.  ? ?Was started on farxiga by nephrology and he developed yeast infection in the groin but also a rash across his anterior chest. Wilder Glade was stopped and he was treated with diflucan and lotrimine with improvement of rash. However he says that the skin on the palms of his hands completely flaked off during this time.  ? ?Currently has pealing, dry areas on his palm an papular red rash across his chest. Overall, he says that the rash has improved quite a bit. Now using OTC cortisone on his anterior chest wall. He says that he's now hesitant to try any other new medications.  ? ?Is wondering if he still needed to take weekly metolazone. Did not take any of his medications yet today but will do so upon returning home.  ? ?Recently twisted his right ankle and noted more swelling around the ankle although he says that it's improving.  ? ?Past Medical History:  ?Diagnosis Date  ? A-fib ([place])   ? CHF (congestive heart failure) ([place])   ? Coronary artery disease   ? Diabetes mellitus without complication ([place])   ? Hypertension   ? Renal disorder   ? ?No past surgical history on file. ?No family history on file. ?Social History  ? ?Tobacco Use  ? Smoking status: Never  ?   Passive exposure: Never  ? Smokeless tobacco: Never  ?Substance Use Topics  ? Alcohol use: Not Currently  ? ?Allergies  ?Allergen Reactions  ? Dapagliflozin Rash  ? Penicillin V   ?  Other reaction(s): Unknown ?Other reaction(s): Unknown ?As a child ?  ? Penicillins   ? ?Prior to Admission medications   ?Medication Sig Start Date End Date Taking? Authorizing Provider  ?aspirin EC 81 MG tablet Take 81 mg by mouth daily. Swallow whole.   Yes [provider]  ?calcitRIOL (ROCALTROL) 0.25 MCG capsule Take 0.25 mcg by mouth daily. 06/05/21  Yes [provider]  ?carvedilol (COREG) 25 MG tablet Take 25 mg by mouth 2 (two) times daily. 10/26/20  Yes [provider]  ?enalapril (VASOTEC) 20 MG tablet Take 1 tablet (20 mg total) by mouth daily. 12/21/20  Yes Alisa Graff, FNP  ?furosemide (LASIX) 40 MG tablet Take 40 mg by mouth daily. 10/26/20  Yes [provider]  ?potassium chloride SA (KLOR-CON) 20 MEQ tablet Take 20 mEq by mouth daily. And additional 33mq potassium with each dose of metolazone 10/26/20  Yes [provider]  ?pravastatin (PRAVACHOL) 20 MG tablet Take 20 mg by mouth daily as needed. 10/26/20  Yes [provider]  ?warfarin (COUMADIN) 4 MG tablet Take 4 mg by mouth daily. 10/26/20  Yes [provider]  ?metolazone (ZAROXOLYN)  2.5 MG tablet Take 1 tablet (2.5 mg total) by mouth once a week. 07/24/21   Alisa Graff, FNP  ? ? ?Review of Systems  ?Constitutional:  Positive for fatigue (minimal). Negative for appetite change.  ?HENT:  Negative for congestion, postnasal drip and sore throat.   ?Eyes: Negative.   ?Respiratory:  Positive for shortness of breath (minimal). Negative for cough and chest tightness.   ?Cardiovascular:  Positive for leg swelling. Negative for chest pain and palpitations.  ?Gastrointestinal:  Negative for abdominal distention and abdominal pain.  ?Endocrine: Negative.   ?Genitourinary: Negative.   ?Musculoskeletal:  Positive  for arthralgias (legs). Negative for back pain.  ?Skin:  Positive for rash (palms of hands/ chest).  ?Allergic/Immunologic: Negative.   ?Neurological:  Negative for dizziness and light-headedness.  ?Hematological:  Negative for adenopathy. Bruises/bleeds easily.  ?Psychiatric/Behavioral:  Negative for dysphoric mood and sleep disturbance (sleeping on 1 pillow). The patient is not nervous/anxious.   ? ?Vitals:  ? 07/24/21 0845  ?BP: (!) 144/89  ?Pulse: (!) 105  ?Resp: 18  ?SpO2: 100%  ?Weight: 223 lb 4 oz (101.3 kg)  ?Height: '5\' 10"'$  (1.778 m)  ? ?Wt Readings from Last 3 Encounters:  ?07/24/21 223 lb 4 oz (101.3 kg)  ?07/18/21 222 lb 9.6 oz (101 kg)  ?06/06/21 226 lb (102.5 kg)  ? ?Lab Results  ?Component Value Date  ? CREATININE 1.74 (H) 12/16/2020  ? CREATININE 1.84 (H) 12/15/2020  ? CREATININE 2.48 (H) 12/13/2020  ? ?Physical Exam ?Vitals and nursing note reviewed.  ?Constitutional:   ?   Appearance: Normal appearance.  ?HENT:  ?   Head: Normocephalic and atraumatic.  ?Cardiovascular:  ?   Rate and Rhythm: Tachycardia present. Rhythm irregular.  ?Pulmonary:  ?   Effort: Pulmonary effort is normal. No respiratory distress.  ?   Breath sounds: No wheezing or rales.  ?Abdominal:  ?   General: There is no distension.  ?   Palpations: Abdomen is soft.  ?Musculoskeletal:     ?   General: No tenderness.  ?   Cervical back: Normal range of motion and neck supple.  ?   Right lower leg: Edema (trace pitting) present.  ?   Left lower leg: No edema.  ?Skin: ?   General: Skin is warm and dry.  ?   Findings: No erythema.  ?Neurological:  ?   General: No focal deficit present.  ?   Mental Status: He is alert and oriented to person, place, and time.  ?Psychiatric:     ?   Mood and Affect: Mood normal.     ?   Behavior: Behavior normal.     ?   Thought Content: Thought content normal.  ? ?Assessment & Plan: ? 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 ?2: HTN- ?- BP elevated (144/89)  but he hasn't taken any of his medications yet today; will take them upon return home ?- saw PCP ([facility]) 01/19/21 ?- BMP 05/08/21 reviewed and showed sodium 135, potassium 4.1, creatinine 1.54 and GFR 49 ? ?3: DM- ?- saw nephrology Holley Raring) 05/25/21 ?- A1c 12/10/20 was 6.8% ?- diet controlled ? ?4: Paroxsymal atrial fibrillation- ?- saw cardiology Corky Sox) 06/14/21 ?- tachycardic today but he hasn't taken his beta-blocker yet  ? ? ?Medication bottles reviewed.  ? ?Return in 4 months, sooner if needed.  ? ? ?  ? ? ? ?

## 2021-07-24 ENCOUNTER — Encounter: Payer: Self-pay | Admitting: Family

## 2021-07-24 ENCOUNTER — Ambulatory Visit: Payer: PPO | Attending: Family | Admitting: Family

## 2021-07-24 VITALS — BP 144/89 | HR 105 | Resp 18 | Ht 70.0 in | Wt 223.2 lb

## 2021-07-24 DIAGNOSIS — I130 Hypertensive heart and chronic kidney disease with heart failure and stage 1 through stage 4 chronic kidney disease, or unspecified chronic kidney disease: Secondary | ICD-10-CM | POA: Insufficient documentation

## 2021-07-24 DIAGNOSIS — N189 Chronic kidney disease, unspecified: Secondary | ICD-10-CM | POA: Diagnosis not present

## 2021-07-24 DIAGNOSIS — Z79899 Other long term (current) drug therapy: Secondary | ICD-10-CM | POA: Diagnosis not present

## 2021-07-24 DIAGNOSIS — I5032 Chronic diastolic (congestive) heart failure: Secondary | ICD-10-CM | POA: Insufficient documentation

## 2021-07-24 DIAGNOSIS — I10 Essential (primary) hypertension: Secondary | ICD-10-CM | POA: Diagnosis not present

## 2021-07-24 DIAGNOSIS — E1122 Type 2 diabetes mellitus with diabetic chronic kidney disease: Secondary | ICD-10-CM | POA: Diagnosis not present

## 2021-07-24 DIAGNOSIS — N1831 Chronic kidney disease, stage 3a: Secondary | ICD-10-CM | POA: Diagnosis not present

## 2021-07-24 DIAGNOSIS — I480 Paroxysmal atrial fibrillation: Secondary | ICD-10-CM | POA: Insufficient documentation

## 2021-07-24 MED ORDER — METOLAZONE 2.5 MG PO TABS: 2.5000 mg | ORAL_TABLET | ORAL | 3 refills | Status: DC

## 2021-07-24 NOTE — Patient Instructions (Signed)
Continue weighing daily and call for an overnight weight gain of 3 pounds or more or a weekly weight gain of more than 5 pounds.   If you have voicemail, please make sure your mailbox is cleaned out so that we may leave a message and please make sure to listen to any voicemails.     

## 2021-07-25 MED ORDER — ASPIRIN EC 81 MG PO TBEC: 81.0000 mg | DELAYED_RELEASE_TABLET | Freq: Every day | ORAL | 5 refills | Status: DC

## 2021-07-25 NOTE — Addendum Note (Signed)
Addended by: Darylene Price A on: 07/25/2021 08:57 AM ? ? Modules accepted: Orders ? ?

## 2021-08-11 DIAGNOSIS — Z79899 Other long term (current) drug therapy: Secondary | ICD-10-CM | POA: Diagnosis not present

## 2021-08-11 DIAGNOSIS — E118 Type 2 diabetes mellitus with unspecified complications: Secondary | ICD-10-CM | POA: Diagnosis not present

## 2021-08-11 DIAGNOSIS — I10 Essential (primary) hypertension: Secondary | ICD-10-CM | POA: Diagnosis not present

## 2021-08-11 DIAGNOSIS — E7800 Pure hypercholesterolemia, unspecified: Secondary | ICD-10-CM | POA: Diagnosis not present

## 2021-08-15 DIAGNOSIS — Z7901 Long term (current) use of anticoagulants: Secondary | ICD-10-CM | POA: Diagnosis not present

## 2021-08-16 DIAGNOSIS — E118 Type 2 diabetes mellitus with unspecified complications: Secondary | ICD-10-CM | POA: Diagnosis not present

## 2021-08-16 DIAGNOSIS — E782 Mixed hyperlipidemia: Secondary | ICD-10-CM | POA: Diagnosis not present

## 2021-08-16 DIAGNOSIS — E876 Hypokalemia: Secondary | ICD-10-CM | POA: Diagnosis not present

## 2021-08-16 DIAGNOSIS — Z79899 Other long term (current) drug therapy: Secondary | ICD-10-CM | POA: Diagnosis not present

## 2021-08-16 DIAGNOSIS — I480 Paroxysmal atrial fibrillation: Secondary | ICD-10-CM | POA: Diagnosis not present

## 2021-08-16 DIAGNOSIS — Z0000 Encounter for general adult medical examination without abnormal findings: Secondary | ICD-10-CM | POA: Diagnosis not present

## 2021-08-16 DIAGNOSIS — I482 Chronic atrial fibrillation, unspecified: Secondary | ICD-10-CM | POA: Diagnosis not present

## 2021-08-16 DIAGNOSIS — E7800 Pure hypercholesterolemia, unspecified: Secondary | ICD-10-CM | POA: Diagnosis not present

## 2021-08-16 DIAGNOSIS — N183 Chronic kidney disease, stage 3 unspecified: Secondary | ICD-10-CM | POA: Diagnosis not present

## 2021-08-16 DIAGNOSIS — T502X5A Adverse effect of carbonic-anhydrase inhibitors, benzothiadiazides and other diuretics, initial encounter: Secondary | ICD-10-CM | POA: Diagnosis not present

## 2021-08-16 DIAGNOSIS — I5022 Chronic systolic (congestive) heart failure: Secondary | ICD-10-CM | POA: Diagnosis not present

## 2021-08-16 DIAGNOSIS — I10 Essential (primary) hypertension: Secondary | ICD-10-CM | POA: Diagnosis not present

## 2021-09-05 DIAGNOSIS — I10 Essential (primary) hypertension: Secondary | ICD-10-CM | POA: Diagnosis not present

## 2021-09-05 DIAGNOSIS — D631 Anemia in chronic kidney disease: Secondary | ICD-10-CM | POA: Diagnosis not present

## 2021-09-05 DIAGNOSIS — N2581 Secondary hyperparathyroidism of renal origin: Secondary | ICD-10-CM | POA: Diagnosis not present

## 2021-09-05 DIAGNOSIS — N1832 Chronic kidney disease, stage 3b: Secondary | ICD-10-CM | POA: Diagnosis not present

## 2021-09-05 DIAGNOSIS — N1831 Chronic kidney disease, stage 3a: Secondary | ICD-10-CM | POA: Diagnosis not present

## 2021-09-18 DIAGNOSIS — Z7901 Long term (current) use of anticoagulants: Secondary | ICD-10-CM | POA: Diagnosis not present

## 2021-09-27 DIAGNOSIS — I509 Heart failure, unspecified: Secondary | ICD-10-CM | POA: Diagnosis not present

## 2021-09-27 DIAGNOSIS — I4891 Unspecified atrial fibrillation: Secondary | ICD-10-CM | POA: Diagnosis not present

## 2021-09-27 DIAGNOSIS — Z6829 Body mass index (BMI) 29.0-29.9, adult: Secondary | ICD-10-CM | POA: Diagnosis not present

## 2021-10-24 DIAGNOSIS — I2510 Atherosclerotic heart disease of native coronary artery without angina pectoris: Secondary | ICD-10-CM | POA: Diagnosis not present

## 2021-10-24 DIAGNOSIS — I429 Cardiomyopathy, unspecified: Secondary | ICD-10-CM | POA: Diagnosis not present

## 2021-10-24 DIAGNOSIS — I10 Essential (primary) hypertension: Secondary | ICD-10-CM | POA: Diagnosis not present

## 2021-10-24 DIAGNOSIS — Z7901 Long term (current) use of anticoagulants: Secondary | ICD-10-CM | POA: Diagnosis not present

## 2021-11-07 DIAGNOSIS — Z7901 Long term (current) use of anticoagulants: Secondary | ICD-10-CM | POA: Diagnosis not present

## 2021-11-13 DIAGNOSIS — E118 Type 2 diabetes mellitus with unspecified complications: Secondary | ICD-10-CM | POA: Diagnosis not present

## 2021-11-13 DIAGNOSIS — E782 Mixed hyperlipidemia: Secondary | ICD-10-CM | POA: Diagnosis not present

## 2021-11-13 DIAGNOSIS — Z79899 Other long term (current) drug therapy: Secondary | ICD-10-CM | POA: Diagnosis not present

## 2021-11-13 DIAGNOSIS — I10 Essential (primary) hypertension: Secondary | ICD-10-CM | POA: Diagnosis not present

## 2021-11-20 DIAGNOSIS — Z7901 Long term (current) use of anticoagulants: Secondary | ICD-10-CM | POA: Diagnosis not present

## 2021-11-23 DIAGNOSIS — I2510 Atherosclerotic heart disease of native coronary artery without angina pectoris: Secondary | ICD-10-CM | POA: Diagnosis not present

## 2021-11-23 DIAGNOSIS — Z7901 Long term (current) use of anticoagulants: Secondary | ICD-10-CM | POA: Diagnosis not present

## 2021-11-23 DIAGNOSIS — I10 Essential (primary) hypertension: Secondary | ICD-10-CM | POA: Diagnosis not present

## 2021-11-23 DIAGNOSIS — Z1211 Encounter for screening for malignant neoplasm of colon: Secondary | ICD-10-CM | POA: Diagnosis not present

## 2021-11-23 DIAGNOSIS — Z125 Encounter for screening for malignant neoplasm of prostate: Secondary | ICD-10-CM | POA: Diagnosis not present

## 2021-11-23 DIAGNOSIS — E118 Type 2 diabetes mellitus with unspecified complications: Secondary | ICD-10-CM | POA: Diagnosis not present

## 2021-11-23 DIAGNOSIS — I480 Paroxysmal atrial fibrillation: Secondary | ICD-10-CM | POA: Diagnosis not present

## 2021-11-23 DIAGNOSIS — Z79899 Other long term (current) drug therapy: Secondary | ICD-10-CM | POA: Diagnosis not present

## 2021-11-23 DIAGNOSIS — N183 Chronic kidney disease, stage 3 unspecified: Secondary | ICD-10-CM | POA: Diagnosis not present

## 2021-11-23 DIAGNOSIS — E7800 Pure hypercholesterolemia, unspecified: Secondary | ICD-10-CM | POA: Diagnosis not present

## 2021-11-27 ENCOUNTER — Encounter: Payer: Self-pay | Admitting: Family

## 2021-11-27 ENCOUNTER — Ambulatory Visit: Payer: PPO | Attending: Family | Admitting: Family

## 2021-11-27 VITALS — BP 143/94 | HR 58 | Resp 16 | Ht 70.0 in | Wt 210.2 lb

## 2021-11-27 DIAGNOSIS — I10 Essential (primary) hypertension: Secondary | ICD-10-CM

## 2021-11-27 DIAGNOSIS — I480 Paroxysmal atrial fibrillation: Secondary | ICD-10-CM | POA: Diagnosis not present

## 2021-11-27 DIAGNOSIS — R634 Abnormal weight loss: Secondary | ICD-10-CM | POA: Diagnosis not present

## 2021-11-27 DIAGNOSIS — R21 Rash and other nonspecific skin eruption: Secondary | ICD-10-CM | POA: Diagnosis not present

## 2021-11-27 DIAGNOSIS — E1122 Type 2 diabetes mellitus with diabetic chronic kidney disease: Secondary | ICD-10-CM | POA: Insufficient documentation

## 2021-11-27 DIAGNOSIS — I2510 Atherosclerotic heart disease of native coronary artery without angina pectoris: Secondary | ICD-10-CM | POA: Diagnosis not present

## 2021-11-27 DIAGNOSIS — I130 Hypertensive heart and chronic kidney disease with heart failure and stage 1 through stage 4 chronic kidney disease, or unspecified chronic kidney disease: Secondary | ICD-10-CM | POA: Diagnosis not present

## 2021-11-27 DIAGNOSIS — I5032 Chronic diastolic (congestive) heart failure: Secondary | ICD-10-CM | POA: Diagnosis not present

## 2021-11-27 DIAGNOSIS — N1831 Chronic kidney disease, stage 3a: Secondary | ICD-10-CM

## 2021-11-27 DIAGNOSIS — N189 Chronic kidney disease, unspecified: Secondary | ICD-10-CM | POA: Diagnosis not present

## 2021-11-27 MED ORDER — METOLAZONE 2.5 MG PO TABS: 2.5000 mg | ORAL_TABLET | ORAL | 3 refills | Status: DC

## 2021-11-27 NOTE — Patient Instructions (Addendum)
Continue eating low sodium low cholesterol diet.    Continue weighing daily and call for an overnight weight gain of 3 pounds or more or a weekly weight gain of more than 5 pounds.

## 2021-11-27 NOTE — Progress Notes (Signed)
Patient ID: Timothy Chavez, male    DOB: June 27, 1950, 71 y.o.   MRN: [id number]  HPI  Timothy Chavez is Chavez 71 y/o male with Chavez history of HTN, CAD, CKD, atrial fibrillation, DM & chronic heart failure.   Echo report from 12/10/20 reviewed and showed an EF of 50-55% along with mild LVH.  Has not been admitted or been in the ED since 12/09/20.  He presents today for Chavez follow-up visit with no complaints.Denies any fatigue or SOB, difficulty sleeping, dizziness, blurred vision, abdominal distention/pain, palpitations, chest pain/pressure, or weight gain. He states that he is watching his diet and eats very well, low sodium low cholesterol foods and drinks only water. He denies smoking tobacco or drinking alcohol. He takes metalazone every Sunday and says that it is helping him lose weight. Was started on farxiga by nephrology and he developed yeast infection in the groin but also Chavez rash across his anterior chest. Wilder Glade was stopped and he was treated with diflucan and lotrimine with improvement of rash. However he says that the skin on the palms of his hands completely flaked off during this time. Did not take any of his medications yet today but will do so upon returning home. He refuses to start entresto today but will continue to think about it for next visit.   Past Medical History:  Diagnosis Date   Chavez-fib [facility])    CHF (congestive heart failure) ([place])    Coronary artery disease    Diabetes mellitus without complication ([place])    Hypertension    Renal disorder    No past surgical history on file. No family history on file. Social History   Tobacco Use   Smoking status: Never    Passive exposure: Never   Smokeless tobacco: Never  Substance Use Topics   Alcohol use: Not Currently   Allergies  Allergen Reactions   Dapagliflozin Rash   Penicillin V     Other reaction(s): Unknown Other reaction(s): Unknown As Chavez child    Penicillins    Prior to Admission medications   Medication Sig Start  Date End Date Taking? Authorizing Provider  aspirin EC 81 MG tablet Take 1 tablet (81 mg total) by mouth daily. Swallow whole. 07/25/21  Yes Timothy Chavez, Timothy Kluver A, FNP  calcitRIOL (ROCALTROL) 0.25 MCG capsule Take 0.25 mcg by mouth daily. 06/05/21  Yes [provider]  carvedilol (COREG) 25 MG tablet Take 25 mg by mouth 2 (two) times daily. 10/26/20  Yes [provider]  enalapril (VASOTEC) 20 MG tablet Take 1 tablet (20 mg total) by mouth daily. 12/21/20  Yes Timothy Chavez, Timothy Kluver A, FNP  furosemide (LASIX) 40 MG tablet Take 40 mg by mouth daily. 10/26/20  Yes [provider]  metolazone (ZAROXOLYN) 2.5 MG tablet Take 1 tablet (2.5 mg total) by mouth once Chavez week. 11/27/21  Yes Timothy Chavez, Timothy Kluver A, FNP  potassium chloride SA (KLOR-CON) 20 MEQ tablet Take 20 mEq by mouth daily. And additional 72mq potassium with each dose of metolazone 10/26/20  Yes [provider]  pravastatin (PRAVACHOL) 20 MG tablet Take 20 mg by mouth daily as needed. 10/26/20  Yes [provider]  warfarin (COUMADIN) 4 MG tablet Take 4 mg by mouth daily. 10/26/20  Yes [provider]     Review of Systems  Constitutional:  Negative for appetite change and fatigue.  HENT:  Negative for congestion, postnasal drip and sore throat.   Eyes: Negative.   Respiratory:  Negative for cough, chest  tightness and shortness of breath.   Cardiovascular:  Negative for chest pain, palpitations and leg swelling.  Gastrointestinal:  Negative for abdominal distention and abdominal pain.  Endocrine: Negative.   Genitourinary: Negative.   Musculoskeletal:  Negative for arthralgias and back pain.  Skin:  Negative for rash.  Allergic/Immunologic: Negative.   Neurological:  Negative for dizziness and light-headedness.  Hematological:  Negative for adenopathy. Bruises/bleeds easily.  Psychiatric/Behavioral:  Negative for dysphoric mood and sleep disturbance (sleeping on 1 pillow). The patient is not nervous/anxious.     .. Vitals:   11/27/21 0817  BP: (!) 143/94  Pulse: (!) 58  Resp: 16  SpO2: 99%  Weight: 210 lb 4 oz (95.4 kg)  Height: '5\' 10"'$  (1.778 m)   Wt Readings from Last 3 Encounters:  11/27/21 210 lb 4 oz (95.4 kg)  07/24/21 223 lb 4 oz (101.3 kg)  07/18/21 222 lb 9.6 oz (101 kg)   .Marland Kitchen Lab Results  Component Value Date   CREATININE 1.74 (H) 12/16/2020   CREATININE 1.84 (H) 12/15/2020   CREATININE 2.48 (H) 12/13/2020     Physical Exam Vitals and nursing note reviewed.  Constitutional:      Appearance: Normal appearance.  HENT:     Head: Normocephalic and atraumatic.  Cardiovascular:     Rate and Rhythm: Bradycardia present.  Pulmonary:     Effort: Pulmonary effort is normal. No respiratory distress.     Breath sounds: No wheezing or rales.  Abdominal:     General: There is no distension.     Palpations: Abdomen is soft.  Musculoskeletal:        General: No tenderness.     Cervical back: Normal range of motion and neck supple.     Right lower leg: No edema.     Left lower leg: No edema.  Skin:    General: Skin is warm and dry.     Findings: No erythema.  Neurological:     General: No focal deficit present.     Mental Status: He is alert and oriented to person, place, and time.  Psychiatric:        Mood and Affect: Mood normal.        Behavior: Behavior normal.        Thought Content: Thought content normal.    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 - not  adding salt but to Chavez few foods; reviewed the importance of reading food labels for sodium content and to keep his daily sodium intake to '2000mg'$  / day - wearing compression socks 2-3 times Chavez week - BNP 12/12/20 was 240.8  2: HTN- - BP elevated (143/94) but he hasn't taken any of his medications yet today; will take them upon return home - Saw PCP ([facility]) 11/23/21 - BMP 11/13/21 reviewed and showed sodium 138, potassium 4.1, creatinine 1.4 and GFR 50 (Labs from [facility])  3: DM- - saw nephrology Holley Raring) 09/05/21 - A1c 11/13/21 was 6.2% - Diet  controlled  4: Paroxsymal atrial fibrillation- - saw cardiology Corky Sox) 10/24/21   Medication bottles reviewed.   Return in 6 months, sooner if needed.

## 2021-12-04 DIAGNOSIS — Z7901 Long term (current) use of anticoagulants: Secondary | ICD-10-CM | POA: Diagnosis not present

## 2021-12-05 DIAGNOSIS — Z6829 Body mass index (BMI) 29.0-29.9, adult: Secondary | ICD-10-CM | POA: Diagnosis not present

## 2021-12-05 DIAGNOSIS — I4891 Unspecified atrial fibrillation: Secondary | ICD-10-CM | POA: Diagnosis not present

## 2021-12-05 DIAGNOSIS — Z7901 Long term (current) use of anticoagulants: Secondary | ICD-10-CM | POA: Diagnosis not present

## 2021-12-05 DIAGNOSIS — E118 Type 2 diabetes mellitus with unspecified complications: Secondary | ICD-10-CM | POA: Diagnosis not present

## 2021-12-05 DIAGNOSIS — I509 Heart failure, unspecified: Secondary | ICD-10-CM | POA: Diagnosis not present

## 2021-12-15 DIAGNOSIS — Z1211 Encounter for screening for malignant neoplasm of colon: Secondary | ICD-10-CM | POA: Diagnosis not present

## 2021-12-19 DIAGNOSIS — D631 Anemia in chronic kidney disease: Secondary | ICD-10-CM | POA: Diagnosis not present

## 2021-12-19 DIAGNOSIS — N2581 Secondary hyperparathyroidism of renal origin: Secondary | ICD-10-CM | POA: Diagnosis not present

## 2021-12-19 DIAGNOSIS — Z7901 Long term (current) use of anticoagulants: Secondary | ICD-10-CM | POA: Diagnosis not present

## 2021-12-19 DIAGNOSIS — N1831 Chronic kidney disease, stage 3a: Secondary | ICD-10-CM | POA: Diagnosis not present

## 2021-12-19 DIAGNOSIS — I10 Essential (primary) hypertension: Secondary | ICD-10-CM | POA: Diagnosis not present

## 2021-12-29 DIAGNOSIS — R195 Other fecal abnormalities: Secondary | ICD-10-CM | POA: Diagnosis not present

## 2022-01-29 DIAGNOSIS — Z7901 Long term (current) use of anticoagulants: Secondary | ICD-10-CM | POA: Diagnosis not present

## 2022-02-07 DIAGNOSIS — Z7901 Long term (current) use of anticoagulants: Secondary | ICD-10-CM | POA: Diagnosis not present

## 2022-02-07 DIAGNOSIS — N1831 Chronic kidney disease, stage 3a: Secondary | ICD-10-CM | POA: Diagnosis not present

## 2022-02-07 DIAGNOSIS — I482 Chronic atrial fibrillation, unspecified: Secondary | ICD-10-CM | POA: Diagnosis not present

## 2022-02-07 DIAGNOSIS — I509 Heart failure, unspecified: Secondary | ICD-10-CM | POA: Diagnosis not present

## 2022-02-15 DIAGNOSIS — I10 Essential (primary) hypertension: Secondary | ICD-10-CM | POA: Diagnosis not present

## 2022-02-15 DIAGNOSIS — E782 Mixed hyperlipidemia: Secondary | ICD-10-CM | POA: Diagnosis not present

## 2022-02-15 DIAGNOSIS — I2510 Atherosclerotic heart disease of native coronary artery without angina pectoris: Secondary | ICD-10-CM | POA: Diagnosis not present

## 2022-02-15 DIAGNOSIS — I480 Paroxysmal atrial fibrillation: Secondary | ICD-10-CM | POA: Diagnosis not present

## 2022-02-20 DIAGNOSIS — E118 Type 2 diabetes mellitus with unspecified complications: Secondary | ICD-10-CM | POA: Diagnosis not present

## 2022-02-20 DIAGNOSIS — I10 Essential (primary) hypertension: Secondary | ICD-10-CM | POA: Diagnosis not present

## 2022-02-20 DIAGNOSIS — Z79899 Other long term (current) drug therapy: Secondary | ICD-10-CM | POA: Diagnosis not present

## 2022-02-20 DIAGNOSIS — E7800 Pure hypercholesterolemia, unspecified: Secondary | ICD-10-CM | POA: Diagnosis not present

## 2022-02-20 DIAGNOSIS — Z125 Encounter for screening for malignant neoplasm of prostate: Secondary | ICD-10-CM | POA: Diagnosis not present

## 2022-02-27 DIAGNOSIS — E118 Type 2 diabetes mellitus with unspecified complications: Secondary | ICD-10-CM | POA: Diagnosis not present

## 2022-02-27 DIAGNOSIS — D61818 Other pancytopenia: Secondary | ICD-10-CM | POA: Diagnosis not present

## 2022-02-27 DIAGNOSIS — E782 Mixed hyperlipidemia: Secondary | ICD-10-CM | POA: Diagnosis not present

## 2022-02-27 DIAGNOSIS — Z7901 Long term (current) use of anticoagulants: Secondary | ICD-10-CM | POA: Diagnosis not present

## 2022-02-27 DIAGNOSIS — I480 Paroxysmal atrial fibrillation: Secondary | ICD-10-CM | POA: Diagnosis not present

## 2022-02-27 DIAGNOSIS — N183 Chronic kidney disease, stage 3 unspecified: Secondary | ICD-10-CM | POA: Diagnosis not present

## 2022-02-27 DIAGNOSIS — I10 Essential (primary) hypertension: Secondary | ICD-10-CM | POA: Diagnosis not present

## 2022-03-06 ENCOUNTER — Other Ambulatory Visit: Payer: Self-pay | Admitting: *Deleted

## 2022-03-06 DIAGNOSIS — R791 Abnormal coagulation profile: Secondary | ICD-10-CM | POA: Diagnosis not present

## 2022-03-06 DIAGNOSIS — D649 Anemia, unspecified: Secondary | ICD-10-CM

## 2022-03-07 ENCOUNTER — Other Ambulatory Visit: Payer: Self-pay

## 2022-03-07 ENCOUNTER — Inpatient Hospital Stay: Payer: PPO

## 2022-03-07 ENCOUNTER — Inpatient Hospital Stay: Payer: PPO | Attending: Oncology | Admitting: Oncology

## 2022-03-07 ENCOUNTER — Encounter: Payer: Self-pay | Admitting: Oncology

## 2022-03-07 VITALS — BP 144/76 | HR 89 | Temp 99.0°F | Resp 18 | Wt 218.7 lb

## 2022-03-07 DIAGNOSIS — I110 Hypertensive heart disease with heart failure: Secondary | ICD-10-CM | POA: Diagnosis not present

## 2022-03-07 DIAGNOSIS — I2510 Atherosclerotic heart disease of native coronary artery without angina pectoris: Secondary | ICD-10-CM | POA: Insufficient documentation

## 2022-03-07 DIAGNOSIS — Z7901 Long term (current) use of anticoagulants: Secondary | ICD-10-CM | POA: Diagnosis not present

## 2022-03-07 DIAGNOSIS — I4891 Unspecified atrial fibrillation: Secondary | ICD-10-CM | POA: Diagnosis not present

## 2022-03-07 DIAGNOSIS — E119 Type 2 diabetes mellitus without complications: Secondary | ICD-10-CM | POA: Diagnosis not present

## 2022-03-07 DIAGNOSIS — D61818 Other pancytopenia: Secondary | ICD-10-CM | POA: Insufficient documentation

## 2022-03-07 DIAGNOSIS — I509 Heart failure, unspecified: Secondary | ICD-10-CM | POA: Insufficient documentation

## 2022-03-07 DIAGNOSIS — D649 Anemia, unspecified: Secondary | ICD-10-CM

## 2022-03-07 DIAGNOSIS — Z79899 Other long term (current) drug therapy: Secondary | ICD-10-CM | POA: Insufficient documentation

## 2022-03-07 LAB — LACTATE DEHYDROGENASE: LDH: 166 U/L (ref 98–192)

## 2022-03-07 LAB — FOLATE: Folate: 13.4 ng/mL (ref >5.9)

## 2022-03-07 LAB — VITAMIN B12: Vitamin B-12: 329 pg/mL (ref 180–914)

## 2022-03-07 LAB — SAMPLE TO BLOOD BANK

## 2022-03-07 LAB — CBC
HCT: 28.3 % — ABNORMAL LOW (ref 39.0–52.0)
Hemoglobin: 9 g/dL — ABNORMAL LOW (ref 13.0–17.0)
MCH: 29.8 pg (ref 26.0–34.0)
MCHC: 31.8 g/dL (ref 30.0–36.0)
MCV: 93.7 fL (ref 80.0–100.0)
Platelets: 115 10*3/uL — ABNORMAL LOW (ref 150–400)
RBC: 3.02 MIL/uL — ABNORMAL LOW (ref 4.22–5.81)
RDW: 16 % — ABNORMAL HIGH (ref 11.5–15.5)
WBC: 2.8 10*3/uL — ABNORMAL LOW (ref 4.0–10.5)
nRBC: 0 % (ref 0.0–0.2)

## 2022-03-07 LAB — IRON AND TIBC
Iron: 53 ug/dL (ref 45–182)
Saturation Ratios: 14 % — ABNORMAL LOW (ref 17.9–39.5)
TIBC: 389 ug/dL (ref 250–450)
UIBC: 336 ug/dL

## 2022-03-07 LAB — FERRITIN: Ferritin: 46 ng/mL (ref 24–336)

## 2022-03-07 LAB — DAT, POLYSPECIFIC AHG (ARMC ONLY): Polyspecific AHG test: NEGATIVE

## 2022-03-07 NOTE — Progress Notes (Signed)
Pt denies weakness or dizziness. He feels better than he has in a while. He mows yards. Appetite is good. Cardiology is managing his coumadin for AFib. He has renal failure as well.

## 2022-03-07 NOTE — Progress Notes (Signed)
[name]  [phone] [phone]  ID: Timothy Chavez OB: May 02, 1950  MR#: [id number]  JIZ#:[id number]  Patient Care Team: Idelle Crouch, MD as PCP - General (Internal Medicine)  CHIEF COMPLAINT: Pancytopenia.  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  He has noticed increased weakness and fatigue, but otherwise feels well.  He has no neurologic complaints.  He denies any recent fevers or illnesses.  He has a good appetite and denies weight loss.  He has no chest pain, shortness of breath, cough, or hemoptysis.  He denies any nausea, vomiting, constipation, or diarrhea.  He has no urinary complaints.  Patient otherwise feels well and offers no further specific complaints today.  REVIEW OF SYSTEMS:   Review of Systems  Constitutional:  Positive for malaise/fatigue. Negative for fever and weight loss.  Respiratory: Negative.  Negative for cough, hemoptysis and shortness of breath.   Cardiovascular: Negative.  Negative for chest pain and leg swelling.  Gastrointestinal: Negative.  Negative for abdominal pain.  Genitourinary: Negative.  Negative for dysuria.  Musculoskeletal: Negative.  Negative for back pain.  Skin: Negative.  Negative for rash.  Neurological: Negative.  Negative for dizziness, seizures, weakness and headaches.  Psychiatric/Behavioral: Negative.  The patient is not nervous/anxious.     As per HPI. Otherwise, a complete review of systems is negative.  PAST MEDICAL HISTORY: Past Medical History:  Diagnosis Date   A-fib ([place])    CHF (congestive heart failure) ([place])    Coronary artery disease    Diabetes mellitus without complication ([place])    Hypertension    Renal disorder     PAST SURGICAL HISTORY: History reviewed. No pertinent surgical history.  FAMILY HISTORY: History reviewed. No pertinent family history.  ADVANCED DIRECTIVES (Y/N):   N  HEALTH MAINTENANCE: Social History   Tobacco Use   Smoking status: Never    Passive exposure: Never   Smokeless tobacco: Never  Substance Use Topics   Alcohol use: Not Currently   Drug use: Not Currently     Colonoscopy:  PAP:  Bone density:  Lipid panel:  Allergies  Allergen Reactions   Dapagliflozin Rash   Penicillin V     Other reaction(s): Unknown Other reaction(s): Unknown As a child    Penicillins     Current Outpatient Medications  Medication Sig Dispense Refill   calcitRIOL (ROCALTROL) 0.25 MCG capsule Take 0.25 mcg by mouth daily.     carvedilol (COREG) 25 MG tablet Take 25 mg by mouth 2 (two) times daily.     enalapril (VASOTEC) 20 MG tablet Take 1 tablet (20 mg total) by mouth daily. 30 tablet 5   furosemide (LASIX) 40 MG tablet Take 40 mg by mouth daily.     metolazone (ZAROXOLYN) 2.5 MG tablet Take 1 tablet (2.5 mg total) by mouth once a week. 12 tablet 3   potassium chloride SA (KLOR-CON) 20 MEQ tablet Take 20 mEq by mouth daily. And additional 66mq potassium with each dose of metolazone     pravastatin (PRAVACHOL) 20 MG tablet Take 20 mg by mouth daily as needed.     warfarin (COUMADIN) 4 MG tablet Take 4 mg by mouth daily. 4 mg 3 days a week and 5 mg 4 days a week     No current facility-administered medications for this visit.    OBJECTIVE: Vitals:   03/07/22 0852  BP: (!) 144/76  Pulse: 89  Resp: 18  Temp: 99 F (37.2 C)  SpO2: 99%     Body mass index is 31.38 kg/m.    ECOG FS:0 - Asymptomatic  General: Well-developed, well-nourished, no acute distress. Eyes: Pink conjunctiva, anicteric sclera. HEENT: Normocephalic, moist mucous membranes. Lungs: No audible wheezing or coughing. Heart: Regular rate and rhythm. Abdomen: Soft, nontender, no obvious distention. Musculoskeletal: No edema, cyanosis, or clubbing. Neuro: Alert, answering all questions appropriately. Cranial nerves grossly intact. Skin: No rashes or petechiae  noted. Psych: Normal affect. Lymphatics: No cervical, calvicular, axillary or inguinal LAD.   LAB RESULTS:  Lab Results  Component Value Date   NA 141 12/16/2020   K 3.2 (L) 12/16/2020   CL 96 (L) 12/16/2020   CO2 40 (H) 12/16/2020   GLUCOSE 189 (H) 12/16/2020   BUN 65 (H) 12/16/2020   CREATININE 1.74 (H) 12/16/2020   CALCIUM 8.7 (L) 12/16/2020   PROT 6.2 (L) 12/12/2020   ALBUMIN 2.4 (L) 12/12/2020   AST 15 12/12/2020   ALT 10 12/12/2020   ALKPHOS 55 12/12/2020   BILITOT 0.7 12/12/2020   GFRNONAA 42 (L) 12/16/2020    Lab Results  Component Value Date   WBC 2.8 (L) 03/07/2022   NEUTROABS 6.9 12/13/2020   HGB 9.0 (L) 03/07/2022   HCT 28.3 (L) 03/07/2022   MCV 93.7 03/07/2022   PLT 115 (L) 03/07/2022     STUDIES: No results found.  ASSESSMENT: Pancytopenia.  PLAN:    Pancytopenia: Upon review of patient's chart, he has had a mild pancytopenia since at least August 2022.  His white blood cell count has trended down slightly to 2.8.  Hemoglobin is decreased, but relatively stable at 9.0.  Platelets are also decreased but stable at 115.  The remainder of his laboratory work is pending at time of dictation.  No intervention is needed at this time.  Patient does not require bone marrow biopsy, but would consider one in the future if needed.  Return to clinic in 3 weeks for further evaluation and discussion of his laboratory results.  I spent a total of 45 minutes reviewing chart data, face-to-face evaluation with the patient, counseling and coordination of care as detailed above.   Patient expressed understanding and was in agreement with this plan. He also understands that He can call clinic at any time with any questions, concerns, or complaints.    Lloyd Huger, MD   03/07/2022 11:21 AM

## 2022-03-08 LAB — ERYTHROPOIETIN: Erythropoietin: 64 m[IU]/mL — ABNORMAL HIGH (ref 2.6–18.5)

## 2022-03-09 LAB — HAPTOGLOBIN: Haptoglobin: <10 mg/dL — ABNORMAL LOW (ref 32–363)

## 2022-03-10 LAB — PLATELET ANTIBODY PROFILE
Glycoprotein IV Antibody: NEGATIVE
HLA Ab Ser Ql EIA: NEGATIVE
IA/IIA Antibody: NEGATIVE
IB/IX Antibody: NEGATIVE
IIB/IIIA Antibody: NEGATIVE

## 2022-03-12 LAB — COMP PANEL: LEUKEMIA/LYMPHOMA

## 2022-03-13 LAB — PROTEIN ELECTROPHORESIS, SERUM
A/G Ratio: 0.7 (ref 0.7–1.7)
Albumin ELP: 3 g/dL (ref 2.9–4.4)
Alpha-1-Globulin: 0.2 g/dL (ref 0.0–0.4)
Alpha-2-Globulin: 0.4 g/dL (ref 0.4–1.0)
Beta Globulin: 1 g/dL (ref 0.7–1.3)
Gamma Globulin: 2.9 g/dL — ABNORMAL HIGH (ref 0.4–1.8)
Globulin, Total: 4.4 g/dL — ABNORMAL HIGH (ref 2.2–3.9)
Total Protein ELP: 7.4 g/dL (ref 6.0–8.5)

## 2022-03-13 LAB — NEUTROPHIL AB TEST LEVEL 1: NEUTROPHIL SCR/PANEL INTERP.: NEGATIVE

## 2022-03-20 DIAGNOSIS — Z7901 Long term (current) use of anticoagulants: Secondary | ICD-10-CM | POA: Diagnosis not present

## 2022-03-26 LAB — INTELLIGEN MYELOID

## 2022-03-28 ENCOUNTER — Inpatient Hospital Stay: Payer: PPO | Admitting: Oncology

## 2022-04-23 ENCOUNTER — Inpatient Hospital Stay: Payer: PPO | Attending: Oncology | Admitting: Oncology

## 2022-04-23 ENCOUNTER — Encounter: Payer: Self-pay | Admitting: Oncology

## 2022-04-23 VITALS — BP 166/97 | HR 89 | Temp 97.4°F | Resp 16 | Ht 70.0 in | Wt 219.0 lb

## 2022-04-23 DIAGNOSIS — Z7901 Long term (current) use of anticoagulants: Secondary | ICD-10-CM | POA: Diagnosis not present

## 2022-04-23 DIAGNOSIS — D61818 Other pancytopenia: Secondary | ICD-10-CM

## 2022-04-23 NOTE — Progress Notes (Signed)
[place]  Telephone:([phone] Fax:(336) [phone]  ID: [name] OB: 04-27-1950  MR#: [id number]  BSJ#:[id number]  Patient Care Team: Idelle Crouch, MD as PCP - General (Internal Medicine) Lloyd Huger, MD as Consulting Physician (Oncology)  CHIEF COMPLAINT: Pancytopenia.  INTERVAL HISTORY: Patient returns to clinic today for further evaluation and discussion of his laboratory results.  He continues to feel well and remains asymptomatic.  He does not complain of any weakness or fatigue today. He has no neurologic complaints.  He denies any recent fevers or illnesses.  He has a good appetite and denies weight loss.  He has no chest pain, shortness of breath, cough, or hemoptysis.  He denies any nausea, vomiting, constipation, or diarrhea.  He has no urinary complaints.  Patient offers no specific complaints today.  REVIEW OF SYSTEMS:   Review of Systems  Constitutional: Negative.  Negative for fever, malaise/fatigue and weight loss.  Respiratory: Negative.  Negative for cough, hemoptysis and shortness of breath.   Cardiovascular: Negative.  Negative for chest pain and leg swelling.  Gastrointestinal: Negative.  Negative for abdominal pain.  Genitourinary: Negative.  Negative for dysuria.  Musculoskeletal: Negative.  Negative for back pain.  Skin: Negative.  Negative for rash.  Neurological: Negative.  Negative for dizziness, seizures, weakness and headaches.  Psychiatric/Behavioral: Negative.  The patient is not nervous/anxious.     As per HPI. Otherwise, a complete review of systems is negative.  PAST MEDICAL HISTORY: Past Medical History:  Diagnosis Date   A-fib ([place])    CHF (congestive heart failure) ([place])    Coronary artery disease    Diabetes mellitus without complication ([place])    Hypertension    Renal disorder     PAST SURGICAL HISTORY: History reviewed. No pertinent surgical history.  FAMILY HISTORY: Family History  Problem  Relation Age of Onset   Hypertension Sister    Anemia Sister    Anemia Sister    Anemia Sister    Kidney disease Sister     ADVANCED DIRECTIVES (Y/N):  N  HEALTH MAINTENANCE: Social History   Tobacco Use   Smoking status: Never    Passive exposure: Never   Smokeless tobacco: Never  Substance Use Topics   Alcohol use: Not Currently   Drug use: Not Currently     Colonoscopy:  PAP:  Bone density:  Lipid panel:  Allergies  Allergen Reactions   Dapagliflozin Rash   Penicillin V     Other reaction(s): Unknown Other reaction(s): Unknown As a child    Penicillins     Current Outpatient Medications  Medication Sig Dispense Refill   calcitRIOL (ROCALTROL) 0.25 MCG capsule Take 0.25 mcg by mouth daily.     carvedilol (COREG) 25 MG tablet Take 25 mg by mouth 2 (two) times daily.     enalapril (VASOTEC) 20 MG tablet Take 1 tablet (20 mg total) by mouth daily. 30 tablet 5   furosemide (LASIX) 40 MG tablet Take 40 mg by mouth daily.     metolazone (ZAROXOLYN) 2.5 MG tablet Take 1 tablet (2.5 mg total) by mouth once a week. 12 tablet 3   potassium chloride SA (KLOR-CON) 20 MEQ tablet Take 20 mEq by mouth daily. And additional 20mq potassium with each dose of metolazone     pravastatin (PRAVACHOL) 20 MG tablet Take 20 mg by mouth daily as needed.     warfarin (COUMADIN) 4 MG tablet Take 4 mg by mouth daily. 4 mg 3 days a week  and 5 mg 4 days a week     No current facility-administered medications for this visit.    OBJECTIVE: Vitals:   04/23/22 0938  BP: (!) 166/97  Pulse: 89  Resp: 16  Temp: (!) 97.4 F (36.3 C)  SpO2: 99%     Body mass index is 31.42 kg/m.    ECOG FS:0 - Asymptomatic  General: Well-developed, well-nourished, no acute distress. Eyes: Pink conjunctiva, anicteric sclera. HEENT: Normocephalic, moist mucous membranes. Lungs: No audible wheezing or coughing. Heart: Regular rate and rhythm. Abdomen: Soft, nontender, no obvious  distention. Musculoskeletal: No edema, cyanosis, or clubbing. Neuro: Alert, answering all questions appropriately. Cranial nerves grossly intact. Skin: No rashes or petechiae noted. Psych: Normal affect.   LAB RESULTS:  Lab Results  Component Value Date   NA 141 12/16/2020   K 3.2 (L) 12/16/2020   CL 96 (L) 12/16/2020   CO2 40 (H) 12/16/2020   GLUCOSE 189 (H) 12/16/2020   BUN 65 (H) 12/16/2020   CREATININE 1.74 (H) 12/16/2020   CALCIUM 8.7 (L) 12/16/2020   PROT 6.2 (L) 12/12/2020   ALBUMIN 2.4 (L) 12/12/2020   AST 15 12/12/2020   ALT 10 12/12/2020   ALKPHOS 55 12/12/2020   BILITOT 0.7 12/12/2020   GFRNONAA 42 (L) 12/16/2020    Lab Results  Component Value Date   WBC 2.8 (L) 03/07/2022   NEUTROABS 6.9 12/13/2020   HGB 9.0 (L) 03/07/2022   HCT 28.3 (L) 03/07/2022   MCV 93.7 03/07/2022   PLT 115 (L) 03/07/2022     STUDIES: No results found.  ASSESSMENT: Pancytopenia.  PLAN:    Pancytopenia: Upon review of patient's chart, he has had a mild pancytopenia since at least August 2022.  His white blood cell count has trended down slightly to 2.8.  Hemoglobin is decreased, but relatively stable at 9.0.  Platelets are also decreased but stable at 115.  He has a mildly decreased iron saturation ratio at 14%, but the remainder of his laboratory work including iron stores, B12, folate, flow cytometry, SPEP, platelet antibodies, and IntelliGEN myeloid panel are either negative or within normal limits.  Patient does not require bone marrow biopsy at this time, but would consider one in the future if his blood counts continue to decline.  No intervention is needed at this time.  Return to clinic in 4 months with repeat laboratory work and further evaluation.    I spent a total of 20 minutes reviewing chart data, face-to-face evaluation with the patient, counseling and coordination of care as detailed above.   Patient expressed understanding and was in agreement with this plan. He  also understands that He can call clinic at any time with any questions, concerns, or complaints.    Lloyd Huger, MD   04/23/2022 10:08 AM

## 2022-04-24 DIAGNOSIS — D631 Anemia in chronic kidney disease: Secondary | ICD-10-CM | POA: Diagnosis not present

## 2022-04-24 DIAGNOSIS — N2581 Secondary hyperparathyroidism of renal origin: Secondary | ICD-10-CM | POA: Diagnosis not present

## 2022-04-24 DIAGNOSIS — I10 Essential (primary) hypertension: Secondary | ICD-10-CM | POA: Diagnosis not present

## 2022-04-24 DIAGNOSIS — N1831 Chronic kidney disease, stage 3a: Secondary | ICD-10-CM | POA: Diagnosis not present

## 2022-04-30 DIAGNOSIS — D61818 Other pancytopenia: Secondary | ICD-10-CM | POA: Diagnosis not present

## 2022-04-30 DIAGNOSIS — Z7901 Long term (current) use of anticoagulants: Secondary | ICD-10-CM | POA: Diagnosis not present

## 2022-04-30 DIAGNOSIS — D638 Anemia in other chronic diseases classified elsewhere: Secondary | ICD-10-CM | POA: Diagnosis not present

## 2022-04-30 DIAGNOSIS — R195 Other fecal abnormalities: Secondary | ICD-10-CM | POA: Diagnosis not present

## 2022-05-21 DIAGNOSIS — Z7901 Long term (current) use of anticoagulants: Secondary | ICD-10-CM | POA: Diagnosis not present

## 2022-05-25 NOTE — Progress Notes (Unsigned)
Patient ID: BURTON DANZIGER, male    DOB: Nov 22, 1950, 72 y.o.   MRN: [id number]  HPI  Mr Manthey is a 72 y/o male with a history of HTN, CAD, CKD, atrial fibrillation, DM & chronic heart failure.   Echo report from 12/10/20 reviewed and showed an EF of 50-55% along with mild LVH.  Has not been admitted or been in the ED in the last 6 months.   He presents today for a follow-up visit with no complaints.  Past Medical History:  Diagnosis Date   A-fib [facility])    CHF (congestive heart failure) ([employer])    Coronary artery disease    Diabetes mellitus without complication ([place])    Hypertension    Renal disorder    No past surgical history on file. Family History  Problem Relation Age of Onset   Hypertension Sister    Anemia Sister    Anemia Sister    Anemia Sister    Kidney disease Sister    Social History   Tobacco Use   Smoking status: Never    Passive exposure: Never   Smokeless tobacco: Never  Substance Use Topics   Alcohol use: Not Currently   Allergies  Allergen Reactions   Dapagliflozin Rash   Penicillin V     Other reaction(s): Unknown Other reaction(s): Unknown As a child    Penicillins       Review of Systems  Constitutional:  Negative for appetite change and fatigue.  HENT:  Negative for congestion, postnasal drip and sore throat.   Eyes: Negative.   Respiratory:  Negative for cough, chest tightness and shortness of breath.   Cardiovascular:  Negative for chest pain, palpitations and leg swelling.  Gastrointestinal:  Negative for abdominal distention and abdominal pain.  Endocrine: Negative.   Genitourinary: Negative.   Musculoskeletal:  Negative for arthralgias and back pain.  Skin:  Negative for rash.  Allergic/Immunologic: Negative.   Neurological:  Negative for dizziness and light-headedness.  Hematological:  Negative for adenopathy. Bruises/bleeds easily.  Psychiatric/Behavioral:  Negative for dysphoric mood and sleep disturbance (sleeping on 1  pillow). The patient is not nervous/anxious.       Physical Exam Vitals and nursing note reviewed.  Constitutional:      Appearance: Normal appearance.  HENT:     Head: Normocephalic and atraumatic.  Cardiovascular:     Rate and Rhythm: Bradycardia present.  Pulmonary:     Effort: Pulmonary effort is normal. No respiratory distress.     Breath sounds: No wheezing or rales.  Abdominal:     General: There is no distension.     Palpations: Abdomen is soft.  Musculoskeletal:        General: No tenderness.     Cervical back: Normal range of motion and neck supple.     Right lower leg: No edema.     Left lower leg: No edema.  Skin:    General: Skin is warm and dry.     Findings: No erythema.  Neurological:     General: No focal deficit present.     Mental Status: He is alert and oriented to person, place, and time.  Psychiatric:        Mood and Affect: Mood normal.        Behavior: Behavior normal.        Thought Content: Thought content normal.    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)- - NYHA class  I - euvolemic today - weighing daily; reminded to call for an overnight weight gain of > 2 pounds or a weekly weight gain of > 5 pounds - weight 210.4 pounds from last visit here 6 months ago - had severe rash with farxiga with peeling of palms of the hands - updated echo scheduled for - not adding salt but to a few foods; reviewed the importance of reading food labels for sodium content and to keep his daily sodium intake to 2075m / day - wearing compression socks 2-3 times a week - BNP 12/12/20 was 240.8  2: HTN- - BP  - saw PCP ([facility]) 02/27/22 - BMP 04/24/22 reviewed and showed sodium 136, potassium 3.6, creatinine 1.53 and GFR 49   3: DM- - saw nephrology (Holley Raring 04/24/22 - A1c 02/20/22 was 6.2% - Diet controlled  4: Paroxsymal atrial fibrillation- - saw cardiology (Nehemiah Massed 02/15/22  5:  Anemia- - saw GI ([facility]) 04/30/22 - hemoglobin 04/24/22 was 10.0   Medication bottles reviewed.

## 2022-05-28 ENCOUNTER — Other Ambulatory Visit (HOSPITAL_COMMUNITY): Payer: Self-pay

## 2022-05-28 ENCOUNTER — Encounter: Payer: Self-pay | Admitting: Family

## 2022-05-28 ENCOUNTER — Ambulatory Visit: Payer: PPO | Attending: Family | Admitting: Family

## 2022-05-28 VITALS — BP 148/97 | HR 86 | Resp 18 | Wt 225.0 lb

## 2022-05-28 DIAGNOSIS — I10 Essential (primary) hypertension: Secondary | ICD-10-CM | POA: Diagnosis not present

## 2022-05-28 DIAGNOSIS — Z7901 Long term (current) use of anticoagulants: Secondary | ICD-10-CM | POA: Insufficient documentation

## 2022-05-28 DIAGNOSIS — I130 Hypertensive heart and chronic kidney disease with heart failure and stage 1 through stage 4 chronic kidney disease, or unspecified chronic kidney disease: Secondary | ICD-10-CM | POA: Insufficient documentation

## 2022-05-28 DIAGNOSIS — N189 Chronic kidney disease, unspecified: Secondary | ICD-10-CM | POA: Diagnosis not present

## 2022-05-28 DIAGNOSIS — I2510 Atherosclerotic heart disease of native coronary artery without angina pectoris: Secondary | ICD-10-CM | POA: Insufficient documentation

## 2022-05-28 DIAGNOSIS — E1122 Type 2 diabetes mellitus with diabetic chronic kidney disease: Secondary | ICD-10-CM | POA: Diagnosis not present

## 2022-05-28 DIAGNOSIS — R21 Rash and other nonspecific skin eruption: Secondary | ICD-10-CM | POA: Diagnosis not present

## 2022-05-28 DIAGNOSIS — I480 Paroxysmal atrial fibrillation: Secondary | ICD-10-CM | POA: Diagnosis not present

## 2022-05-28 DIAGNOSIS — I5032 Chronic diastolic (congestive) heart failure: Secondary | ICD-10-CM | POA: Diagnosis not present

## 2022-05-28 DIAGNOSIS — D631 Anemia in chronic kidney disease: Secondary | ICD-10-CM | POA: Diagnosis not present

## 2022-05-28 DIAGNOSIS — N1831 Chronic kidney disease, stage 3a: Secondary | ICD-10-CM

## 2022-05-28 DIAGNOSIS — D649 Anemia, unspecified: Secondary | ICD-10-CM | POA: Diagnosis not present

## 2022-05-28 NOTE — Patient Instructions (Addendum)
Continue weighing daily and call for an overnight weight gain of 3 pounds or more or a weekly weight gain of more than 5 pounds.   [facility] at 10:00am

## 2022-05-28 NOTE — Progress Notes (Signed)
[place] - PHARMACIST COUNSELING NOTE  Guideline-Directed Medical Therapy/Evidence Based Medicine  ACE/ARB/ARNI: Enalapril 20 mg twice daily  Beta Blocker: Carvedilol 25 mg twice daily Aldosterone Antagonist:  None Diuretic: Furosemide 40 mg daily + metolazone 2.5 mg once weekly SGLT2i:  None. Pt had a severe infection when he tried [place] in the past.   Adherence Assessment  Do you ever forget to take your medication? []$ Yes [x]$ No  Do you ever skip doses due to side effects? []$ Yes [x]$ No  Do you have trouble affording your medicines? []$ Yes [x]$ No  Are you ever unable to pick up your medication due to transportation difficulties? []$ Yes [x]$ No  Do you ever stop taking your medications because you don't believe they are helping? []$ Yes [x]$ No  Do you check your weight daily? [x]$ Yes []$ No   Adherence strategy: Pill bottles. Sometimes does not take extra potassium tablet when taking metolazone.   Barriers to obtaining medications: Potentially cost for brand medications. Cost for apixaban $11.90 for 90 day supply.   Vital signs: HR 86, BP 148/97, weight (pounds) 225 (6 lbs up from 04/23/22) ECHO: Date 11/2020, EF 50-55, notes mild LVH.      Latest Ref Rng & Units 12/16/2020    5:39 AM 12/15/2020    4:48 AM 12/13/2020    5:54 AM  BMP  Glucose 70 - 99 mg/dL 189  217  208   BUN 8 - 23 mg/dL 65  71  71   Creatinine 0.61 - 1.24 mg/dL 1.74  1.84  2.48   Sodium 135 - 145 mmol/L 141  140  138   Potassium 3.5 - 5.1 mmol/L 3.2  3.4  4.2   Chloride 98 - 111 mmol/L 96  97  97   CO2 22 - 32 mmol/L 40  37  33   Calcium 8.9 - 10.3 mg/dL 8.7  9.0  9.0     Past Medical History:  Diagnosis Date   A-fib ([facility])    CHF (congestive heart failure) ([facility])    Coronary artery disease    Diabetes mellitus without complication ([place])    Hypertension    Renal disorder     ASSESSMENT 72 year old male who presents to the HF clinic follow up appointment for heart  failure. Patient does not have a primary cardiologist anymore. Patient will look for a new one. Pt's weight is up but he states "its the worst time of year to check weights", which I can agree with. He does not have any issues with SOB and extra/more volume overload. He states he is stable. Patient is not open to starting new medications. Education was given on why we start medications. Blood pressure has been elevated for the last three different office visits he's had in the last 2 months. Pt was last seen for a concern for anemia.   PLAN CHF/HTN:  Continue current medications (coreg, enalapril, lasix). Co-pay for entresto is $11.20 for 90 day supply. Current medication do not cost the patient any co-pays. Cost may be an issue. Patient was educated on the four pilar of GDMT, not open to starting anything new. Recommend starting either entresto or spironolactone for better BP control, if patient is open. Patient cannot tolerate SGLT2.   Afib:  Continue coreg. Continue warfarin. Apixaban will cost $11.90 for a 90 day supply.   DM:  A1c 6.2 (10/2021). Patient is not on any diabetes medications. If A1c > 7, then possibly start metformin.   CKD Patient  sees nephro. Continue enalapril.    Time spent: 45 minutes  Oswald Hillock, Pharm.D. Clinical Pharmacist 05/28/2022 9:17 AM    Current Outpatient Medications:    calcitRIOL (ROCALTROL) 0.25 MCG capsule, Take 0.25 mcg by mouth daily., Disp: , Rfl:    carvedilol (COREG) 25 MG tablet, Take 25 mg by mouth 2 (two) times daily., Disp: , Rfl:    enalapril (VASOTEC) 20 MG tablet, Take 1 tablet (20 mg total) by mouth daily., Disp: 30 tablet, Rfl: 5   furosemide (LASIX) 40 MG tablet, Take 40 mg by mouth daily., Disp: , Rfl:    metolazone (ZAROXOLYN) 2.5 MG tablet, Take 1 tablet (2.5 mg total) by mouth once a week., Disp: 12 tablet, Rfl: 3   potassium chloride SA (KLOR-CON) 20 MEQ tablet, Take 20 mEq by mouth daily. And additional 91mq potassium with  each dose of metolazone, Disp: , Rfl:    pravastatin (PRAVACHOL) 40 MG tablet, Take 40 mg by mouth daily., Disp: , Rfl:    warfarin (COUMADIN) 4 MG tablet, Take 4 mg by mouth daily. Take 4 mg Tuesday, Thursday and Saturday and 5 mg on Monday, Wednesday, Friday and Sunday., Disp: , Rfl:     DRUGS TO CAUTION IN HEART FAILURE  Drug or Class Mechanism  Analgesics NSAIDs COX-2 inhibitors Glucocorticoids  Sodium and water retention, increased systemic vascular resistance, decreased response to diuretics   Diabetes Medications Metformin Thiazolidinediones Rosiglitazone (Avandia) Pioglitazone (Actos) DPP4 Inhibitors Saxagliptin (Onglyza) Sitagliptin (Januvia)   Lactic acidosis Possible calcium channel blockade   Unknown  Antiarrhythmics Class I  Flecainide Disopyramide Class III Sotalol Other Dronedarone  Negative inotrope, proarrhythmic   Proarrhythmic, beta blockade  Negative inotrope  Antihypertensives Alpha Blockers Doxazosin Calcium Channel Blockers Diltiazem Verapamil Nifedipine Central Alpha Adrenergics Moxonidine Peripheral Vasodilators Minoxidil  Increases renin and aldosterone  Negative inotrope    Possible sympathetic withdrawal  Unknown  Anti-infective Itraconazole Amphotericin B  Negative inotrope Unknown  Hematologic Anagrelide Cilostazol   Possible inhibition of PD IV Inhibition of PD III causing arrhythmias  Neurologic/Psychiatric Stimulants Anti-Seizure Drugs Carbamazepine Pregabalin Antidepressants Tricyclics Citalopram Parkinsons Bromocriptine Pergolide Pramipexole Antipsychotics Clozapine Antimigraine Ergotamine Methysergide Appetite suppressants Bipolar Lithium  Peripheral alpha and beta agonist activity  Negative inotrope and chronotrope Calcium channel blockade  Negative inotrope, proarrhythmic Dose-dependent QT prolongation  Excessive serotonin activity/valvular damage Excessive serotonin  activity/valvular damage Unknown  IgE mediated hypersensitivy, calcium channel blockade  Excessive serotonin activity/valvular damage Excessive serotonin activity/valvular damage Valvular damage  Direct myofibrillar degeneration, adrenergic stimulation  Antimalarials Chloroquine Hydroxychloroquine Intracellular inhibition of lysosomal enzymes  Urologic Agents Alpha Blockers Doxazosin Prazosin Tamsulosin Terazosin  Increased renin and aldosterone  Adapted from Page RCarleene Overlie et al. "Drugs That May Cause or Exacerbate Heart Failure: A Scientific Statement from the American Heart  Association." Circulation 2016; 134:e32-e69. DOI: 10.1161/CIR.0000000000000426   MEDICATION ADHERENCES TIPS AND STRATEGIES Taking medication as prescribed improves patient outcomes in heart failure (reduces hospitalizations, improves symptoms, increases survival) Side effects of medications can be managed by decreasing doses, switching agents, stopping drugs, or adding additional therapy. Please [facility] if you have having bothersome side effects so we can modify your regimen. Do not alter your medication regimen without talking to [place]  Medication reminders can help patients remember to take drugs on time. If you are missing or forgetting doses you can try linking behaviors, using pill boxes, or an electronic reminder like an alarm on your phone or an app. Some people can also get automated  phone calls as medication reminders.

## 2022-05-30 DIAGNOSIS — I10 Essential (primary) hypertension: Secondary | ICD-10-CM | POA: Diagnosis not present

## 2022-05-30 DIAGNOSIS — I5022 Chronic systolic (congestive) heart failure: Secondary | ICD-10-CM | POA: Diagnosis not present

## 2022-05-30 DIAGNOSIS — E118 Type 2 diabetes mellitus with unspecified complications: Secondary | ICD-10-CM | POA: Diagnosis not present

## 2022-05-30 DIAGNOSIS — Z79899 Other long term (current) drug therapy: Secondary | ICD-10-CM | POA: Diagnosis not present

## 2022-05-30 DIAGNOSIS — E782 Mixed hyperlipidemia: Secondary | ICD-10-CM | POA: Diagnosis not present

## 2022-05-30 DIAGNOSIS — I480 Paroxysmal atrial fibrillation: Secondary | ICD-10-CM | POA: Diagnosis not present

## 2022-05-30 DIAGNOSIS — N183 Chronic kidney disease, stage 3 unspecified: Secondary | ICD-10-CM | POA: Diagnosis not present

## 2022-05-30 DIAGNOSIS — Z0000 Encounter for general adult medical examination without abnormal findings: Secondary | ICD-10-CM | POA: Diagnosis not present

## 2022-06-07 ENCOUNTER — Telehealth: Payer: Self-pay | Admitting: Family

## 2022-06-07 NOTE — Telephone Encounter (Signed)
Patient called scheduling and cancelled his echo as he states his insurance wont cover it but if he does it thru his cardiologist at [place] then it will   [employer], [place]

## 2022-06-15 ENCOUNTER — Ambulatory Visit: Payer: PPO

## 2022-06-18 DIAGNOSIS — Z79899 Other long term (current) drug therapy: Secondary | ICD-10-CM | POA: Diagnosis not present

## 2022-06-18 DIAGNOSIS — E782 Mixed hyperlipidemia: Secondary | ICD-10-CM | POA: Diagnosis not present

## 2022-06-18 DIAGNOSIS — E118 Type 2 diabetes mellitus with unspecified complications: Secondary | ICD-10-CM | POA: Diagnosis not present

## 2022-06-18 DIAGNOSIS — Z7901 Long term (current) use of anticoagulants: Secondary | ICD-10-CM | POA: Diagnosis not present

## 2022-06-18 DIAGNOSIS — I10 Essential (primary) hypertension: Secondary | ICD-10-CM | POA: Diagnosis not present

## 2022-07-02 DIAGNOSIS — R791 Abnormal coagulation profile: Secondary | ICD-10-CM | POA: Diagnosis not present

## 2022-07-09 DIAGNOSIS — R791 Abnormal coagulation profile: Secondary | ICD-10-CM | POA: Diagnosis not present

## 2022-07-23 DIAGNOSIS — Z7901 Long term (current) use of anticoagulants: Secondary | ICD-10-CM | POA: Diagnosis not present

## 2022-08-20 DIAGNOSIS — Z7901 Long term (current) use of anticoagulants: Secondary | ICD-10-CM | POA: Diagnosis not present

## 2022-08-22 ENCOUNTER — Inpatient Hospital Stay: Payer: PPO | Admitting: Oncology

## 2022-08-22 ENCOUNTER — Encounter: Payer: Self-pay | Admitting: Oncology

## 2022-08-22 ENCOUNTER — Inpatient Hospital Stay: Payer: PPO | Attending: Oncology

## 2022-08-22 VITALS — BP 179/93 | HR 73 | Temp 98.0°F | Resp 16 | Ht 70.0 in | Wt 234.0 lb

## 2022-08-22 DIAGNOSIS — D61818 Other pancytopenia: Secondary | ICD-10-CM | POA: Insufficient documentation

## 2022-08-22 LAB — CBC WITH DIFFERENTIAL/PLATELET
Abs Immature Granulocytes: 0.01 10*3/uL (ref 0.00–0.07)
Basophils Absolute: 0 10*3/uL (ref 0.0–0.1)
Basophils Relative: 1 %
Eosinophils Absolute: 0.1 10*3/uL (ref 0.0–0.5)
Eosinophils Relative: 3 %
HCT: 29.5 % — ABNORMAL LOW (ref 39.0–52.0)
Hemoglobin: 9.4 g/dL — ABNORMAL LOW (ref 13.0–17.0)
Immature Granulocytes: 0 %
Lymphocytes Relative: 15 %
Lymphs Abs: 0.5 10*3/uL — ABNORMAL LOW (ref 0.7–4.0)
MCH: 28.8 pg (ref 26.0–34.0)
MCHC: 31.9 g/dL (ref 30.0–36.0)
MCV: 90.5 fL (ref 80.0–100.0)
Monocytes Absolute: 0.5 10*3/uL (ref 0.1–1.0)
Monocytes Relative: 15 %
Neutro Abs: 2 10*3/uL (ref 1.7–7.7)
Neutrophils Relative %: 66 %
Platelets: 93 10*3/uL — ABNORMAL LOW (ref 150–400)
RBC: 3.26 MIL/uL — ABNORMAL LOW (ref 4.22–5.81)
RDW: 16.6 % — ABNORMAL HIGH (ref 11.5–15.5)
WBC: 3.1 10*3/uL — ABNORMAL LOW (ref 4.0–10.5)
nRBC: 0 % (ref 0.0–0.2)

## 2022-08-22 NOTE — Progress Notes (Signed)
[facility]  Telephone:(336) [phone] Fax:(336) [phone]  ID: Timothy Chavez OB: May 04, 1950  MR#: [id number]  AOZ#:[id number]  Patient Care Team: Marguarite Arbour, MD as PCP - General (Internal Medicine) Jeralyn Ruths, MD as Consulting Physician (Oncology)  CHIEF COMPLAINT: Pancytopenia.  INTERVAL HISTORY: Patient returns to clinic today for repeat laboratory work and further evaluation.  He continues to feel well and remains asymptomatic.  He does not complain of any weakness or fatigue today. He has no neurologic complaints.  He denies any recent fevers or illnesses.  He has a good appetite and denies weight loss.  He has no chest pain, shortness of breath, cough, or hemoptysis.  He denies any nausea, vomiting, constipation, or diarrhea.  He has no urinary complaints.  Patient offers no specific complaints today.  REVIEW OF SYSTEMS:   Review of Systems  Constitutional: Negative.  Negative for fever, malaise/fatigue and weight loss.  Respiratory: Negative.  Negative for cough, hemoptysis and shortness of breath.   Cardiovascular: Negative.  Negative for chest pain and leg swelling.  Gastrointestinal: Negative.  Negative for abdominal pain.  Genitourinary: Negative.  Negative for dysuria.  Musculoskeletal: Negative.  Negative for back pain.  Skin: Negative.  Negative for rash.  Neurological: Negative.  Negative for dizziness, seizures, weakness and headaches.  Psychiatric/Behavioral: Negative.  The patient is not nervous/anxious.     As per HPI. Otherwise, a complete review of systems is negative.  PAST MEDICAL HISTORY: Past Medical History:  Diagnosis Date   A-fib ([facility])    CHF (congestive heart failure) ([facility])    Coronary artery disease    Diabetes mellitus without complication ([facility])    Hypertension    Renal disorder     PAST SURGICAL HISTORY: History reviewed. No pertinent surgical history.  FAMILY HISTORY: Family History  Problem Relation Age of  Onset   Hypertension Sister    Anemia Sister    Anemia Sister    Anemia Sister    Kidney disease Sister     ADVANCED DIRECTIVES (Y/N):  N  HEALTH MAINTENANCE: Social History   Tobacco Use   Smoking status: Never    Passive exposure: Never   Smokeless tobacco: Never  Substance Use Topics   Alcohol use: Not Currently   Drug use: Not Currently     Colonoscopy:  PAP:  Bone density:  Lipid panel:  Allergies  Allergen Reactions   Dapagliflozin Rash   Penicillin V     Other reaction(s): Unknown Other reaction(s): Unknown As a child    Penicillins     Current Outpatient Medications  Medication Sig Dispense Refill   calcitRIOL (ROCALTROL) 0.25 MCG capsule Take 0.25 mcg by mouth daily.     carvedilol (COREG) 25 MG tablet Take 25 mg by mouth 2 (two) times daily.     enalapril (VASOTEC) 20 MG tablet Take 1 tablet (20 mg total) by mouth daily. 30 tablet 5   furosemide (LASIX) 40 MG tablet Take 40 mg by mouth daily.     metolazone (ZAROXOLYN) 2.5 MG tablet Take 1 tablet (2.5 mg total) by mouth once a week. 12 tablet 3   potassium chloride SA (KLOR-CON) 20 MEQ tablet Take 20 mEq by mouth daily. And additional potassium with each dose of metolazone     pravastatin (PRAVACHOL) 40 MG tablet Take 40 mg by mouth daily.     warfarin (COUMADIN) 4 MG tablet Take 4 mg by mouth daily. Take 4 mg Tuesday, Thursday and Saturday and 5 mg  on Monday, Wednesday, Friday and Sunday.     No current facility-administered medications for this visit.    OBJECTIVE: Vitals:   08/22/22 0942  BP: (!) 179/93  Pulse: 73  Resp: 16  Temp: 98 F (36.7 C)  SpO2: 100%     Body mass index is 33.58 kg/m.    ECOG FS:0 - Asymptomatic  General: Well-developed, well-nourished, no acute distress. Eyes: Pink conjunctiva, anicteric sclera. HEENT: Normocephalic, moist mucous membranes. Lungs: No audible wheezing or coughing. Heart: Regular rate and rhythm. Abdomen: Soft, nontender, no obvious  distention. Musculoskeletal: No edema, cyanosis, or clubbing. Neuro: Alert, answering all questions appropriately. Cranial nerves grossly intact. Skin: No rashes or petechiae noted. Psych: Normal affect.   LAB RESULTS:  Lab Results  Component Value Date   NA 141 12/16/2020   K 3.2 (L) 12/16/2020   CL 96 (L) 12/16/2020   CO2 40 (H) 12/16/2020   GLUCOSE 189 (H) 12/16/2020   BUN 65 (H) 12/16/2020   CREATININE 1.74 (H) 12/16/2020   CALCIUM 8.7 (L) 12/16/2020   PROT 6.2 (L) 12/12/2020   ALBUMIN 2.4 (L) 12/12/2020   AST 15 12/12/2020   ALT 10 12/12/2020   ALKPHOS 55 12/12/2020   BILITOT 0.7 12/12/2020   GFRNONAA 42 (L) 12/16/2020    Lab Results  Component Value Date   WBC 3.1 (L) 08/22/2022   NEUTROABS 2.0 08/22/2022   HGB 9.4 (L) 08/22/2022   HCT 29.5 (L) 08/22/2022   MCV 90.5 08/22/2022   PLT 93 (L) 08/22/2022     STUDIES: No results found.  ASSESSMENT: Pancytopenia.  PLAN:    Pancytopenia: Chronic and unchanged.  Upon review of patient's chart, he has had a mild pancytopenia since at least August 2022.  He has a mildly decreased iron saturation ratio at 14%, but the remainder of his laboratory work including iron stores, B12, folate, flow cytometry, SPEP, platelet antibodies, and IntelliGEN myeloid panel are either negative or within normal limits.  Patient does not require bone marrow biopsy at this time, but would consider one in the future if his blood counts continue to decline.  No intervention is needed at this time.  After lengthy discussion with the patient, is agreed upon that no further follow-up is necessary.  Continue to monitor CBC 1-2 times per year and refer patient back if there are any questions or concerns.  I spent a total of 20 minutes reviewing chart data, face-to-face evaluation with the patient, counseling and coordination of care as detailed above.    Patient expressed understanding and was in agreement with this plan. He also understands that  He can call clinic at any time with any questions, concerns, or complaints.    Jeralyn Ruths, MD   08/22/2022 1:40 PM

## 2022-08-28 DIAGNOSIS — I10 Essential (primary) hypertension: Secondary | ICD-10-CM | POA: Diagnosis not present

## 2022-08-28 DIAGNOSIS — N1831 Chronic kidney disease, stage 3a: Secondary | ICD-10-CM | POA: Diagnosis not present

## 2022-08-28 DIAGNOSIS — D631 Anemia in chronic kidney disease: Secondary | ICD-10-CM | POA: Diagnosis not present

## 2022-08-28 DIAGNOSIS — N2581 Secondary hyperparathyroidism of renal origin: Secondary | ICD-10-CM | POA: Diagnosis not present

## 2022-09-20 ENCOUNTER — Ambulatory Visit: Payer: PPO | Attending: Family | Admitting: Family

## 2022-09-20 ENCOUNTER — Encounter: Payer: Self-pay | Admitting: Family

## 2022-09-20 ENCOUNTER — Telehealth: Payer: Self-pay

## 2022-09-20 ENCOUNTER — Encounter: Payer: Self-pay | Admitting: Pharmacist

## 2022-09-20 VITALS — BP 176/75 | HR 84 | Wt 230.0 lb

## 2022-09-20 DIAGNOSIS — I130 Hypertensive heart and chronic kidney disease with heart failure and stage 1 through stage 4 chronic kidney disease, or unspecified chronic kidney disease: Secondary | ICD-10-CM | POA: Diagnosis not present

## 2022-09-20 DIAGNOSIS — I10 Essential (primary) hypertension: Secondary | ICD-10-CM | POA: Diagnosis not present

## 2022-09-20 DIAGNOSIS — I5032 Chronic diastolic (congestive) heart failure: Secondary | ICD-10-CM | POA: Diagnosis not present

## 2022-09-20 DIAGNOSIS — D649 Anemia, unspecified: Secondary | ICD-10-CM | POA: Diagnosis not present

## 2022-09-20 DIAGNOSIS — R21 Rash and other nonspecific skin eruption: Secondary | ICD-10-CM | POA: Diagnosis not present

## 2022-09-20 DIAGNOSIS — Z79899 Other long term (current) drug therapy: Secondary | ICD-10-CM | POA: Insufficient documentation

## 2022-09-20 DIAGNOSIS — N1831 Chronic kidney disease, stage 3a: Secondary | ICD-10-CM | POA: Diagnosis not present

## 2022-09-20 DIAGNOSIS — E1122 Type 2 diabetes mellitus with diabetic chronic kidney disease: Secondary | ICD-10-CM | POA: Insufficient documentation

## 2022-09-20 DIAGNOSIS — I480 Paroxysmal atrial fibrillation: Secondary | ICD-10-CM | POA: Insufficient documentation

## 2022-09-20 DIAGNOSIS — D631 Anemia in chronic kidney disease: Secondary | ICD-10-CM

## 2022-09-20 DIAGNOSIS — N189 Chronic kidney disease, unspecified: Secondary | ICD-10-CM | POA: Diagnosis not present

## 2022-09-20 DIAGNOSIS — I2510 Atherosclerotic heart disease of native coronary artery without angina pectoris: Secondary | ICD-10-CM | POA: Diagnosis not present

## 2022-09-20 DIAGNOSIS — Z7901 Long term (current) use of anticoagulants: Secondary | ICD-10-CM | POA: Insufficient documentation

## 2022-09-20 MED ORDER — AMLODIPINE BESYLATE 5 MG PO TABS: 5.0000 mg | ORAL_TABLET | Freq: Every day | ORAL | 1 refills | Status: DC

## 2022-09-20 NOTE — Progress Notes (Signed)
Called pt to let Timothy Chavez know she rx him Amlodipine 5MG  daily and pt also needs to f/u with her in 6-8 wks. Scheduled pt's appt 7/18 @ 8:30AM. Pt had good understanding and no further questions.

## 2022-09-20 NOTE — Progress Notes (Signed)
PCP: Aram Beecham, MD (last seen 02/24) Primary Cardiologist: Arnoldo Hooker, MD (last seen 11/23; returns 12/24 with Paraschos)  HPI:  Mr Pinkus is a 72 y/o male with a history of HTN, CAD, CKD, atrial fibrillation, anemia, hyperparathyroidism, DM & chronic heart failure.   Echo 12/10/20: EF of 50-55% along with mild LVH.  Has not been admitted or been in the ED in the last 6 months.   He presents today for a HF follow-up visit with a chief complaint of intermittent palpitations. Chronic in nature. Denies fatigue, SOB, cough, chest pain, dizziness or difficulty sleeping. Does reports a continued gradual rise in weight.   Spent time at [employer] and admits that he didn't eat well. Ate pizza, hamburgers etc.   Had scheduled echo but patient called and cancelled it as he said that he couldn't get an amount of what (if anything) he would have to pay. He says that when it's done at his cardiologist office that it doesn't cost him anything extra.   ROS: All systems negative except as listed in HPI, PMH and Problem List.  SH:  Social History   Socioeconomic History   Marital status: Single    Spouse name: Not on file   Number of children: Not on file   Years of education: Not on file   Highest education level: Not on file  Occupational History   Not on file  Tobacco Use   Smoking status: Never    Passive exposure: Never   Smokeless tobacco: Never  Substance and Sexual Activity   Alcohol use: Not Currently   Drug use: Not Currently   Sexual activity: Not on file  Other Topics Concern   Not on file  Social History Narrative   Not on file   Social Determinants of Health   Financial Resource Strain: Not on file  Food Insecurity: Not on file  Transportation Needs: Not on file  Physical Activity: Not on file  Stress: Not on file  Social Connections: Not on file  Intimate Partner Violence: Not on file    FH:  Family History  Problem Relation Age of Onset   Hypertension  Sister    Anemia Sister    Anemia Sister    Anemia Sister    Kidney disease Sister     Past Medical History:  Diagnosis Date   A-fib (HCC)    CHF (congestive heart failure) (HCC)    Coronary artery disease    Diabetes mellitus without complication (HCC)    Hypertension    Renal disorder     Current Outpatient Medications  Medication Sig Dispense Refill   calcitRIOL (ROCALTROL) 0.25 MCG capsule Take 0.25 mcg by mouth daily.     carvedilol (COREG) 25 MG tablet Take 25 mg by mouth 2 (two) times daily.     enalapril (VASOTEC) 20 MG tablet Take 1 tablet (20 mg total) by mouth daily. 30 tablet 5   furosemide (LASIX) 40 MG tablet Take 40 mg by mouth daily.     metolazone (ZAROXOLYN) 2.5 MG tablet Take 1 tablet (2.5 mg total) by mouth once a week. 12 tablet 3   potassium chloride SA (KLOR-CON) 20 MEQ tablet Take 20 mEq by mouth daily. And additional potassium with each dose of metolazone     pravastatin (PRAVACHOL) 40 MG tablet Take 40 mg by mouth daily.     warfarin (COUMADIN) 4 MG tablet Take 4 mg by mouth daily. Take 4 mg Tuesday, Thursday and Saturday and 5  mg on Monday, Wednesday, Friday and Sunday.     No current facility-administered medications for this visit.   Vitals:   09/20/22 0814  BP: (!) 176/75  Pulse: 84  SpO2: 100%  Weight: 230 lb (104.3 kg)   Wt Readings from Last 3 Encounters:  09/20/22 230 lb (104.3 kg)  08/22/22 234 lb (106.1 kg)  05/28/22 225 lb (102.1 kg)   Lab Results  Component Value Date   CREATININE 1.74 (H) 12/16/2020   CREATININE 1.84 (H) 12/15/2020   CREATININE 2.48 (H) 12/13/2020   PHYSICAL EXAM:  General:  Well appearing. No resp difficulty HEENT: normal Neck: supple. JVP flat. No lymphadenopathy or thryomegaly appreciated. Cor: PMI normal. Regular rate & rhythm. No rubs, gallops or murmurs. Lungs: clear Abdomen: soft, nontender, nondistended. No hepatosplenomegaly. No bruits or masses.  Extremities: no cyanosis, clubbing, rash,  trace pitting edema bilaterally Neuro: alert & orientedx3, cranial nerves grossly intact. Moves all 4 extremities w/o difficulty. Affect pleasant.   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 - had scheduled echo but he cancelled it because he could not be given an amount of how the insurance would pay and he knows that if it's done at the office, he doesn't have to pay anything for it - had severe rash with farxiga with peeling of palms of the hands - he does not want to change his enalapril to entresto - continue carvedilol 25mg  BID - continue enalapril 20mg  QD - continue furosemide 40mg  QD/ potassium daily - continue metolazone 2.5mg  weekly w/ additional potassium - not adding salt but to a few foods; reviewed the importance of reading food labels for sodium content and to keep his daily sodium intake to 2000mg  / day - wearing compression socks 2-3 times a week - BNP 12/12/20 was 240.8 - PharmD reconciled meds w/ patient  2: HTN- - BP 176/75; 3 weeks ago at nephrology office was 153/88 - he is unsure if he is currently taking amlodipine or not; he will call back once he gets home and look at his bottles - if he's not currently taking it, will resume at 5mg  daily - saw PCP ([facility]) 02/24 - BMP 08/28/22 reviewed and showed sodium 135, potassium 3.2, creatinine 1.61 and GFR 46   3: DM- - saw nephrology Cherylann Ratel) 05/24 - A1c 06/18/22 was 6.7% - diet controlled  4: Paroxsymal atrial fibrillation- - saw cardiology Gwen Pounds) 11/23; returns 12/24 to see Dr. Darrold Junker - wanted to do an EKG today but patient defers as he  wants to do it at cardiology office because then he doesn't have to pay extra for it - continue carvedilol 25mg  BID - warfarin alternating 4mg / 5mg  - INR 2.6 on 08/20/22; had it drawn earlier today  5: Anemia- - saw GI ([facility]) 04/30/22 - hemoglobin 08/28/22 was 9.8 - saw hematology Orlie Dakin) 05/24  Return 02/25, sooner if needed. Will see sooner if amlodipine is resumed.

## 2022-10-01 DIAGNOSIS — N183 Chronic kidney disease, stage 3 unspecified: Secondary | ICD-10-CM | POA: Diagnosis not present

## 2022-10-01 DIAGNOSIS — Z0000 Encounter for general adult medical examination without abnormal findings: Secondary | ICD-10-CM | POA: Diagnosis not present

## 2022-10-01 DIAGNOSIS — E7800 Pure hypercholesterolemia, unspecified: Secondary | ICD-10-CM | POA: Diagnosis not present

## 2022-10-01 DIAGNOSIS — R791 Abnormal coagulation profile: Secondary | ICD-10-CM | POA: Diagnosis not present

## 2022-10-01 DIAGNOSIS — I10 Essential (primary) hypertension: Secondary | ICD-10-CM | POA: Diagnosis not present

## 2022-10-01 DIAGNOSIS — I480 Paroxysmal atrial fibrillation: Secondary | ICD-10-CM | POA: Diagnosis not present

## 2022-10-01 DIAGNOSIS — Z7901 Long term (current) use of anticoagulants: Secondary | ICD-10-CM | POA: Diagnosis not present

## 2022-10-01 DIAGNOSIS — E118 Type 2 diabetes mellitus with unspecified complications: Secondary | ICD-10-CM | POA: Diagnosis not present

## 2022-10-01 DIAGNOSIS — I5022 Chronic systolic (congestive) heart failure: Secondary | ICD-10-CM | POA: Diagnosis not present

## 2022-10-01 DIAGNOSIS — Z79899 Other long term (current) drug therapy: Secondary | ICD-10-CM | POA: Diagnosis not present

## 2022-10-03 NOTE — Progress Notes (Signed)
Patient ID: Timothy Chavez, male   DOB: 04/10/1951, 72 y.o.   MRN: [id number]  [facility] FAILURE [facility]IN[facility] - PHARMACIST COUNSELING NOTE  Guideline-Directed Medical Therapy/Evidence Based Medicine  ACE/ARB/ARNI: Enalapril 20 mg twice daily  Beta Blocker: Carvedilol 25 mg twice daily Aldosterone Antagonist:  N/A Diuretic: Furosemide 40 mg daily  - metolazone 2.5mg  once weekly SGLT2i:  n/a  Adherence Assessment  Do you ever forget to take your medication? [] Yes [x] No  Do you ever skip doses due to side effects? [] Yes [x] No  Do you have trouble affording your medicines? [] Yes [x] No  Are you ever unable to pick up your medication due to transportation difficulties? [] Yes [x] No  Do you ever stop taking your medications because you don't believe they are helping? [] Yes [x] No  Do you check your weight daily? [x] Yes [] No   Barriers to obtaining medications: none  INR 2.6  Vital signs: HR 84, BP 176/75, weight (pounds) 230 lbs  Echo 12/10/20: EF of 50-55% along with mild LVH.      Latest Ref Rng & Units 12/16/2020    5:39 AM 12/15/2020    4:48 AM 12/13/2020    5:54 AM  BMP  Glucose 70 - 99 mg/dL 409  811  914   BUN 8 - 23 mg/dL 65  71  71   Creatinine 0.61 - 1.24 mg/dL 7.82  9.56  2.13   Sodium 135 - 145 mmol/L 141  140  138   Potassium 3.5 - 5.1 mmol/L 3.2  3.4  4.2   Chloride 98 - 111 mmol/L 96  97  97   CO2 22 - 32 mmol/L 40  37  33   Calcium 8.9 - 10.3 mg/dL 8.7  9.0  9.0     Past Medical History:  Diagnosis Date   A-fib (HCC)    CHF (congestive heart failure) (HCC)    Coronary artery disease    Diabetes mellitus without complication (HCC)    Hypertension    Renal disorder     ASSESSMENT 72 year old male who presents to the HF clinic for follow up. PMH includes HTN, CAD, CKD, atrial fibrillation, anemia, hyperparathyroidism, DM & chronic heart failure.  Patient followed by nephrology. Any MRA use defer to nephrology for dosing and  monitoring. MedREd completed during appointment. Patient denies issues with current medication. He has not been admitted to ED in the last 6 months.   PLAN MRA use per nephrology Add amlodipine to improved BP Follow up as directed by NP  Time spent: 15 minutes   Rodriguez-Guzman PharmD, BCPS 10/03/2022 11:02 AM   Current Outpatient Medications:    amLODipine (NORVASC) 10 MG tablet, Take 10 mg by mouth daily., Disp: , Rfl:    amLODipine (NORVASC) 5 MG tablet, Take 1 tablet (5 mg total) by mouth daily., Disp: 90 tablet, Rfl: 1   calcitRIOL (ROCALTROL) 0.25 MCG capsule, Take 0.25 mcg by mouth daily., Disp: , Rfl:    carvedilol (COREG) 25 MG tablet, Take 25 mg by mouth 2 (two) times daily., Disp: , Rfl:    enalapril (VASOTEC) 20 MG tablet, Take 1 tablet (20 mg total) by mouth daily., Disp: 30 tablet, Rfl: 5   furosemide (LASIX) 40 MG tablet, Take 40 mg by mouth daily., Disp: , Rfl:    metolazone (ZAROXOLYN) 2.5 MG tablet, Take 1 tablet (2.5 mg total) by mouth once a week., Disp: 12 tablet, Rfl: 3   potassium chloride SA (KLOR-CON) 20 MEQ tablet, Take  20 mEq by mouth daily. And additional potassium with each dose of metolazone, Disp: , Rfl:    pravastatin (PRAVACHOL) 40 MG tablet, Take 40 mg by mouth daily., Disp: , Rfl:    warfarin (COUMADIN) 4 MG tablet, Take 4 mg by mouth daily. Take 4 mg Tuesday, Thursday and Saturday and 5 mg on Monday, Wednesday, Friday and Sunday., Disp: , Rfl:   MEDICATION ADHERENCES TIPS AND STRATEGIES Taking medication as prescribed improves patient outcomes in heart failure (reduces hospitalizations, improves symptoms, increases survival) Side effects of medications can be managed by decreasing doses, switching agents, stopping drugs, or adding additional therapy. Please let someone in the Heart Failure Clinic know if you have having bothersome side effects so we can modify your regimen. Do not alter your medication regimen without talking to [place].   Medication reminders can help patients remember to take drugs on time. If you are missing or forgetting doses you can try linking behaviors, using pill boxes, or an electronic reminder like an alarm on your phone or an app. Some people can also get automated phone calls as medication reminders.

## 2022-11-01 ENCOUNTER — Encounter: Payer: PPO | Admitting: Family

## 2022-11-02 ENCOUNTER — Encounter: Payer: Self-pay | Admitting: Family

## 2022-11-02 ENCOUNTER — Ambulatory Visit: Payer: PPO | Attending: Family | Admitting: Family

## 2022-11-02 VITALS — BP 129/86 | HR 85 | Wt 222.2 lb

## 2022-11-02 DIAGNOSIS — Z79899 Other long term (current) drug therapy: Secondary | ICD-10-CM | POA: Diagnosis not present

## 2022-11-02 DIAGNOSIS — Z7901 Long term (current) use of anticoagulants: Secondary | ICD-10-CM | POA: Insufficient documentation

## 2022-11-02 DIAGNOSIS — D631 Anemia in chronic kidney disease: Secondary | ICD-10-CM

## 2022-11-02 DIAGNOSIS — E7800 Pure hypercholesterolemia, unspecified: Secondary | ICD-10-CM | POA: Diagnosis not present

## 2022-11-02 DIAGNOSIS — N189 Chronic kidney disease, unspecified: Secondary | ICD-10-CM | POA: Diagnosis not present

## 2022-11-02 DIAGNOSIS — I5032 Chronic diastolic (congestive) heart failure: Secondary | ICD-10-CM | POA: Insufficient documentation

## 2022-11-02 DIAGNOSIS — I2510 Atherosclerotic heart disease of native coronary artery without angina pectoris: Secondary | ICD-10-CM | POA: Insufficient documentation

## 2022-11-02 DIAGNOSIS — I480 Paroxysmal atrial fibrillation: Secondary | ICD-10-CM | POA: Diagnosis not present

## 2022-11-02 DIAGNOSIS — D649 Anemia, unspecified: Secondary | ICD-10-CM | POA: Diagnosis not present

## 2022-11-02 DIAGNOSIS — E118 Type 2 diabetes mellitus with unspecified complications: Secondary | ICD-10-CM | POA: Diagnosis not present

## 2022-11-02 DIAGNOSIS — N1831 Chronic kidney disease, stage 3a: Secondary | ICD-10-CM

## 2022-11-02 DIAGNOSIS — E1122 Type 2 diabetes mellitus with diabetic chronic kidney disease: Secondary | ICD-10-CM

## 2022-11-02 DIAGNOSIS — I130 Hypertensive heart and chronic kidney disease with heart failure and stage 1 through stage 4 chronic kidney disease, or unspecified chronic kidney disease: Secondary | ICD-10-CM | POA: Insufficient documentation

## 2022-11-02 DIAGNOSIS — I10 Essential (primary) hypertension: Secondary | ICD-10-CM

## 2022-11-02 NOTE — Progress Notes (Signed)
PCP: Aram Beecham, MD (last seen 06/24) Primary Cardiologist: Arnoldo Hooker, MD (last seen 11/23; returns 12/24 with Paraschos)  HPI:  Timothy Chavez is a 72 y/o male with a history of HTN, CAD, CKD, atrial fibrillation, anemia, hyperparathyroidism, DM & chronic heart failure.   Has not been admitted or been in the ED in the last 6 months.   Echo 12/10/20: EF of 50-55% along with mild LVH.  He presents today for a HF follow-up visit with a chief complaint of minimal fatigue with moderate exertion. Chronic in nature. Has no other complaints and specifically denies SOB, chest pain, palpitations, cough, dizziness, abdominal distention, pedal edema, weight gain or difficulty sleeping.   No further headaches in the last month since starting amlodipine.   Had physical lab work done earlier today. 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   ROS: All systems negative except as listed in HPI, PMH and Problem List.  SH:  Social History   Socioeconomic History   Marital status: Single    Spouse name: Not on file   Number of children: Not on file   Years of education: Not on file   Highest education level: Not on file  Occupational History   Not on file  Tobacco Use   Smoking status: Never    Passive exposure: Never   Smokeless tobacco: Never  Substance and Sexual Activity   Alcohol use: Not Currently   Drug use: Not Currently   Sexual activity: Not on file  Other Topics Concern   Not on file  Social History Narrative   Not on file   Social Determinants of Health   Financial Resource Strain: Not on file  Food Insecurity: Not on file  Transportation Needs: Not on file  Physical Activity: Not on file  Stress: Not on file  Social Connections: Not on file  Intimate Partner Violence: Not on file    FH:  Family History  Problem Relation Age of Onset   Hypertension Sister    Anemia Sister    Anemia Sister    Anemia Sister    Kidney disease Sister     Past Medical History:   Diagnosis Date   A-fib (HCC)    CHF (congestive heart failure) (HCC)    Coronary artery disease    Diabetes mellitus without complication (HCC)    Hypertension    Renal disorder     Current Outpatient Medications  Medication Sig Dispense Refill   amLODipine (NORVASC) 10 MG tablet Take 10 mg by mouth daily.     amLODipine (NORVASC) 5 MG tablet Take 1 tablet (5 mg total) by mouth daily. 90 tablet 1   calcitRIOL (ROCALTROL) 0.25 MCG capsule Take 0.25 mcg by mouth daily.     carvedilol (COREG) 25 MG tablet Take 25 mg by mouth 2 (two) times daily.     enalapril (VASOTEC) 20 MG tablet Take 1 tablet (20 mg total) by mouth daily. 30 tablet 5   furosemide (LASIX) 40 MG tablet Take 40 mg by mouth daily.     metolazone (ZAROXOLYN) 2.5 MG tablet Take 1 tablet (2.5 mg total) by mouth once a week. 12 tablet 3   potassium chloride SA (KLOR-CON) 20 MEQ tablet Take 20 mEq by mouth daily. And additional potassium with each dose of metolazone     pravastatin (PRAVACHOL) 40 MG tablet Take 40 mg by mouth daily.     warfarin (COUMADIN) 4 MG tablet Take 4 mg by mouth daily. Take 4 mg Tuesday, Thursday  and Saturday and 5 mg on Monday, Wednesday, Friday and Sunday.     No current facility-administered medications for this visit.   Vitals:   11/02/22 0820  BP: 129/86  Pulse: 85  SpO2: 99%  Weight: 222 lb 3.2 oz (100.8 kg)   Wt Readings from Last 3 Encounters:  11/02/22 222 lb 3.2 oz (100.8 kg)  09/20/22 230 lb (104.3 kg)  08/22/22 234 lb (106.1 kg)   Lab Results  Component Value Date   CREATININE 1.74 (H) 12/16/2020   CREATININE 1.84 (H) 12/15/2020   CREATININE 2.48 (H) 12/13/2020   PHYSICAL EXAM:  General:  Well appearing. No resp difficulty HEENT: normal Neck: supple. JVP flat. No lymphadenopathy or thryomegaly appreciated. Cor: PMI normal. Regular rate & rhythm. No rubs, gallops or murmurs. Lungs: clear Abdomen: soft, nontender, nondistended. No hepatosplenomegaly. No bruits or  masses.  Extremities: no cyanosis, clubbing, rash, edema Neuro: alert & orientedx3, cranial nerves grossly intact. Moves all 4 extremities w/o difficulty. Affect pleasant.   ECG: not done (per patient preference)   ASSESSMENT & PLAN:  1: Chronic heart failure with preserved ejection fraction- - suspect due to HTN - NYHA class II - euvolemic today - weighing daily; reminded to call for an overnight weight gain of > 2 pounds or a weekly weight gain of > 5 pounds - weight down 8 pounds from last visit here 6 weeks ago - echo 12/10/20: EF of 50-55% along with mild LVH. - had scheduled echo but he cancelled it because he could not be given an amount of how the insurance would pay and he knows that if it's done at the office, he doesn't have to pay anything for it - sees cardiology later this year and will discuss getting the echo done at his office since he knows it will be covered; it's been almost 2 years since the last one was done - had severe rash with farxiga with peeling of palms of the hands - he does not want to change his enalapril to entresto - continue carvedilol 25mg  BID - continue enalapril 20mg  QD - continue furosemide 40mg  QD/ potassium daily - continue metolazone 2.5mg  weekly w/ additional potassium - not adding salt but to a few foods; reviewed the importance of reading food labels for sodium content and to keep his daily sodium intake to 2000mg  / day - wearing compression socks 2-3 times a week - BNP 12/12/20 was 240.8  2: HTN- - BP 129/86 (much better since resuming amlodipine; no further headaches) - continue amlodipine 5mg  daily - saw PCP ([facility]) 06/24 - BMP 08/28/22 reviewed and showed sodium 135, potassium 3.2, creatinine 1.61 and GFR 46  - says that he had "a bunch" of lab work done earlier today  3: DM- - saw nephrology Cherylann Ratel) 05/24 - A1c 06/18/22 was 6.7% - diet controlled  4: Paroxsymal atrial fibrillation- - saw cardiology Gwen Pounds) 11/23;  returns 12/24 to see Dr. Darrold Junker - wanted to do an EKG today but patient defers as he wants to do it at cardiology office because then he doesn't have to pay extra for it - continue carvedilol 25mg  BID - warfarin alternating 4mg / 5mg  - INR 1.9 on 10/01/22  5: Anemia- - saw GI ([facility]) 04/30/22 - hemoglobin 08/28/22 was 9.8 - saw hematology Orlie Dakin) 05/24  Return Feb/ March 2025, sooner if needed.

## 2022-11-08 DIAGNOSIS — N183 Chronic kidney disease, stage 3 unspecified: Secondary | ICD-10-CM | POA: Diagnosis not present

## 2022-11-08 DIAGNOSIS — E118 Type 2 diabetes mellitus with unspecified complications: Secondary | ICD-10-CM | POA: Diagnosis not present

## 2022-11-08 DIAGNOSIS — R946 Abnormal results of thyroid function studies: Secondary | ICD-10-CM | POA: Diagnosis not present

## 2022-11-08 DIAGNOSIS — I480 Paroxysmal atrial fibrillation: Secondary | ICD-10-CM | POA: Diagnosis not present

## 2022-11-08 DIAGNOSIS — E782 Mixed hyperlipidemia: Secondary | ICD-10-CM | POA: Diagnosis not present

## 2022-11-08 DIAGNOSIS — Z79899 Other long term (current) drug therapy: Secondary | ICD-10-CM | POA: Diagnosis not present

## 2022-11-08 DIAGNOSIS — I5022 Chronic systolic (congestive) heart failure: Secondary | ICD-10-CM | POA: Diagnosis not present

## 2022-11-08 DIAGNOSIS — I10 Essential (primary) hypertension: Secondary | ICD-10-CM | POA: Diagnosis not present

## 2022-11-19 DIAGNOSIS — Z6831 Body mass index (BMI) 31.0-31.9, adult: Secondary | ICD-10-CM | POA: Diagnosis not present

## 2022-11-19 DIAGNOSIS — I5020 Unspecified systolic (congestive) heart failure: Secondary | ICD-10-CM | POA: Diagnosis not present

## 2022-11-19 DIAGNOSIS — I110 Hypertensive heart disease with heart failure: Secondary | ICD-10-CM | POA: Diagnosis not present

## 2022-11-19 DIAGNOSIS — D692 Other nonthrombocytopenic purpura: Secondary | ICD-10-CM | POA: Diagnosis not present

## 2022-11-19 DIAGNOSIS — I482 Chronic atrial fibrillation, unspecified: Secondary | ICD-10-CM | POA: Diagnosis not present

## 2022-11-19 DIAGNOSIS — N1832 Chronic kidney disease, stage 3b: Secondary | ICD-10-CM | POA: Diagnosis not present

## 2022-11-19 DIAGNOSIS — Z87891 Personal history of nicotine dependence: Secondary | ICD-10-CM | POA: Diagnosis not present

## 2022-11-19 DIAGNOSIS — E1122 Type 2 diabetes mellitus with diabetic chronic kidney disease: Secondary | ICD-10-CM | POA: Diagnosis not present

## 2022-11-19 DIAGNOSIS — Z7901 Long term (current) use of anticoagulants: Secondary | ICD-10-CM | POA: Diagnosis not present

## 2022-11-27 ENCOUNTER — Other Ambulatory Visit: Payer: Self-pay | Admitting: Family

## 2022-12-04 DIAGNOSIS — I480 Paroxysmal atrial fibrillation: Secondary | ICD-10-CM | POA: Diagnosis not present

## 2023-01-02 DIAGNOSIS — N1831 Chronic kidney disease, stage 3a: Secondary | ICD-10-CM | POA: Diagnosis not present

## 2023-01-02 DIAGNOSIS — N2581 Secondary hyperparathyroidism of renal origin: Secondary | ICD-10-CM | POA: Diagnosis not present

## 2023-01-02 DIAGNOSIS — I10 Essential (primary) hypertension: Secondary | ICD-10-CM | POA: Diagnosis not present

## 2023-01-02 DIAGNOSIS — D631 Anemia in chronic kidney disease: Secondary | ICD-10-CM | POA: Diagnosis not present

## 2023-01-07 DIAGNOSIS — I480 Paroxysmal atrial fibrillation: Secondary | ICD-10-CM | POA: Diagnosis not present

## 2023-01-23 IMAGING — CT CT HEAD W/O CM
3 series · 15 of 47 positions shown, 18 images · non-contrast
Comparison: None.

CLINICAL DATA: Delirium.  Acute mental status change.

EXAM:
CT HEAD WITHOUT CONTRAST
TECHNIQUE: Contiguous axial images were obtained from the base of the skull
through the vertex without intravenous contrast.

[Series 2: head wo · axial · 0.42mm/px · z∈[+336,+466]mm · 9 of 32 slices shown, 12 images]
[im 3/32  brain]
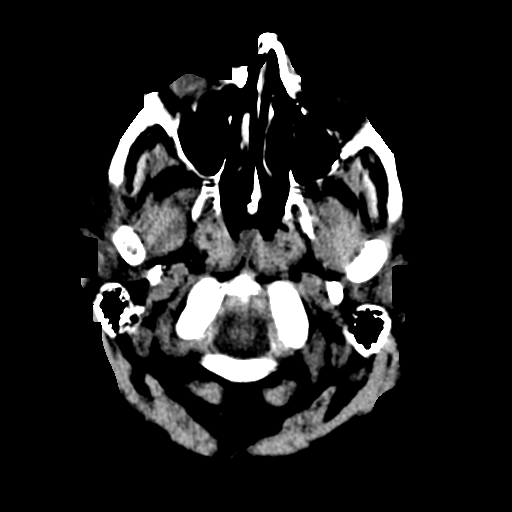
[im 3/32  bone]
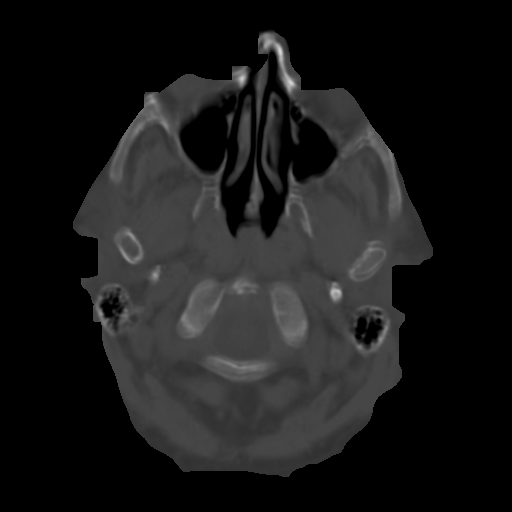
[im 6/32  brain]
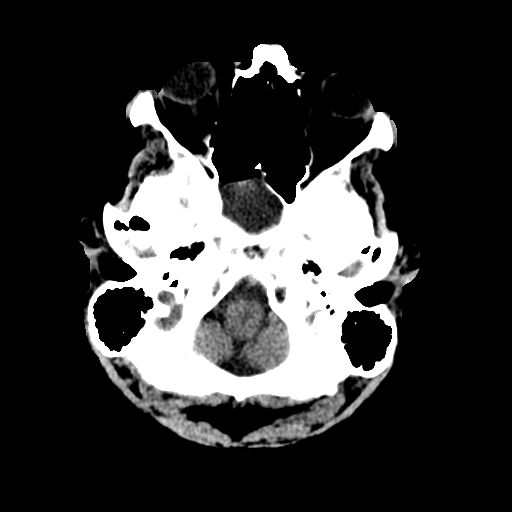
[im 9/32  brain]
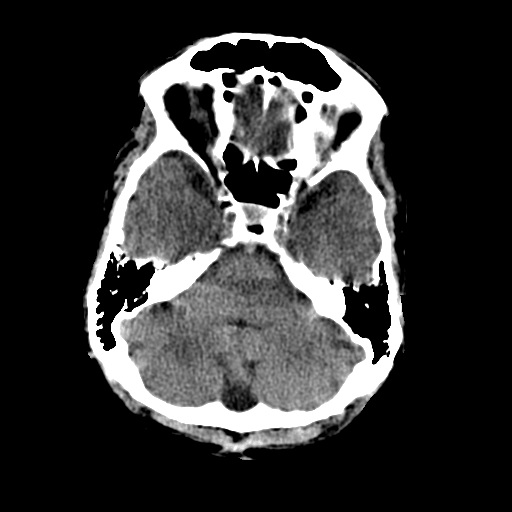
[im 12/32  brain]
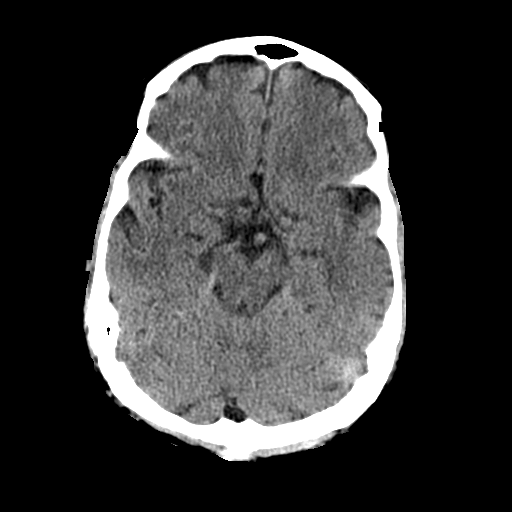
[im 17/32  brain]
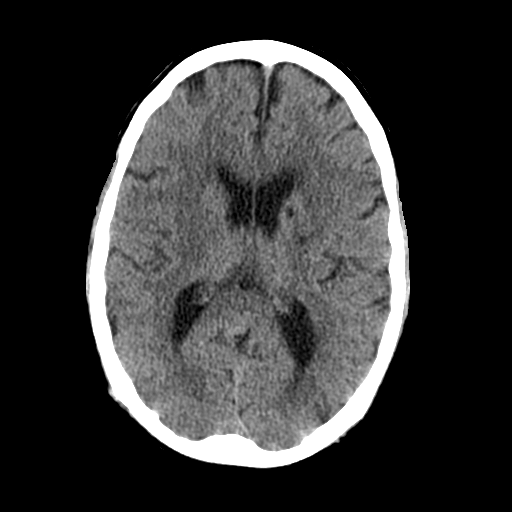
[im 17/32  bone]
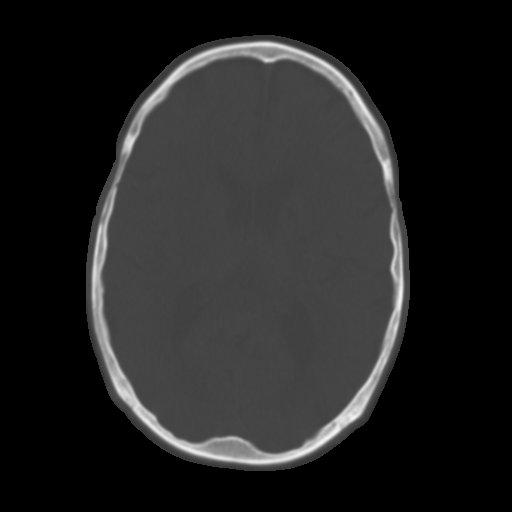
[im 20/32  brain]
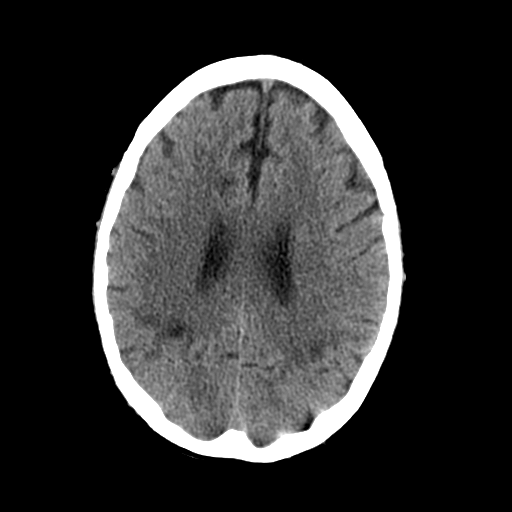
[im 23/32  brain]
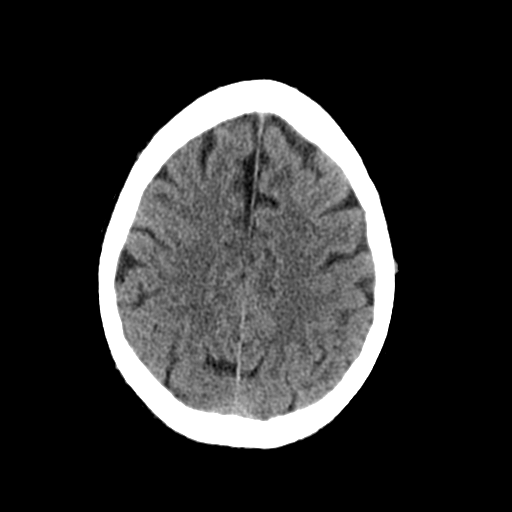
[im 26/32  brain]
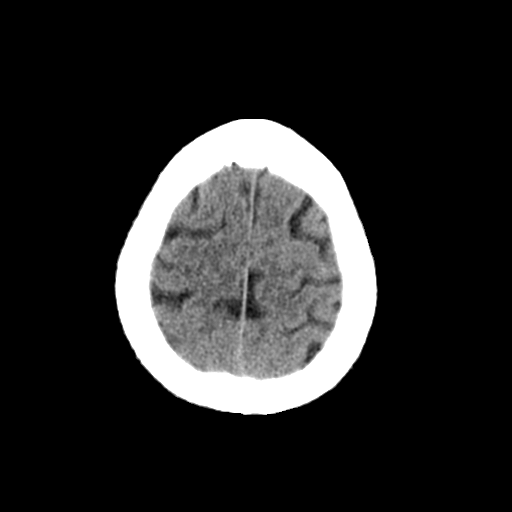
[im 29/32  brain]
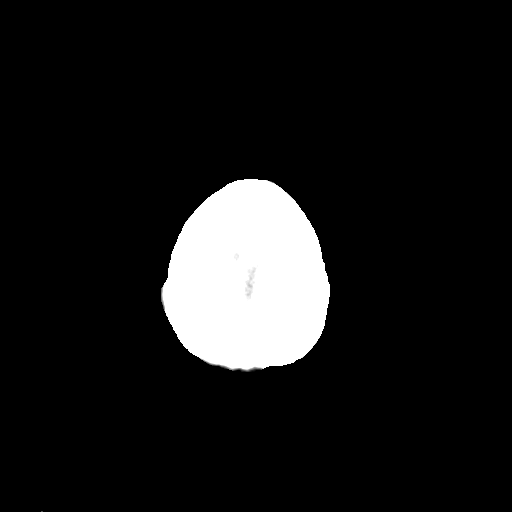
[im 29/32  bone]
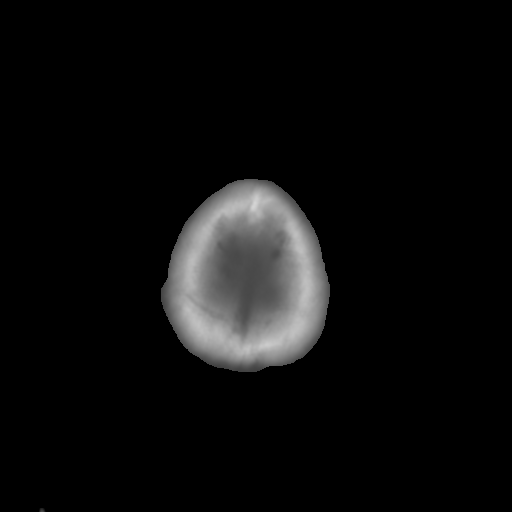

[Series 4: coronal soft tissue · coronal · 0.34mm/px · 3 of 69 slices shown]
[im 23/69  brain]
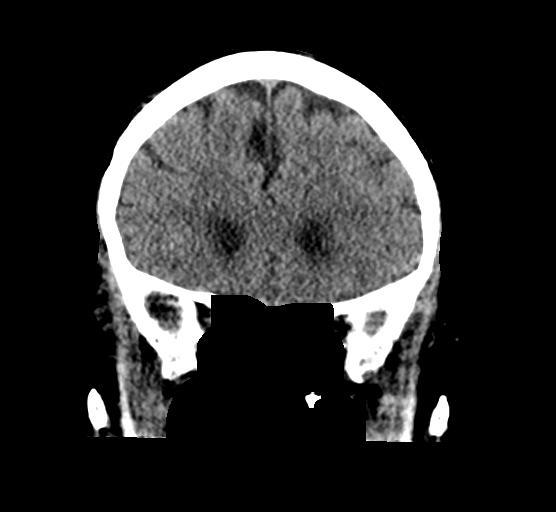
[im 31/69  brain]
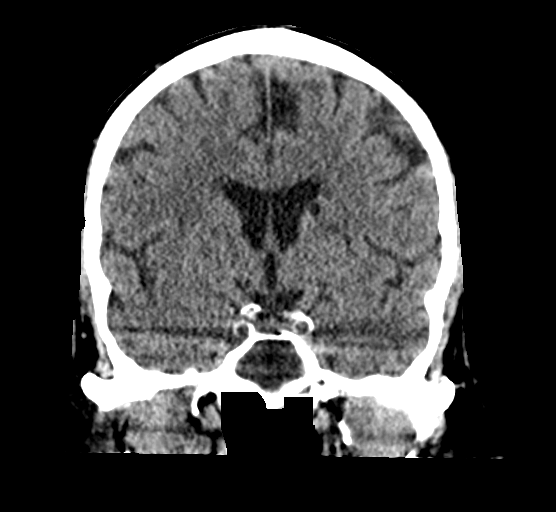
[im 38/69  brain]
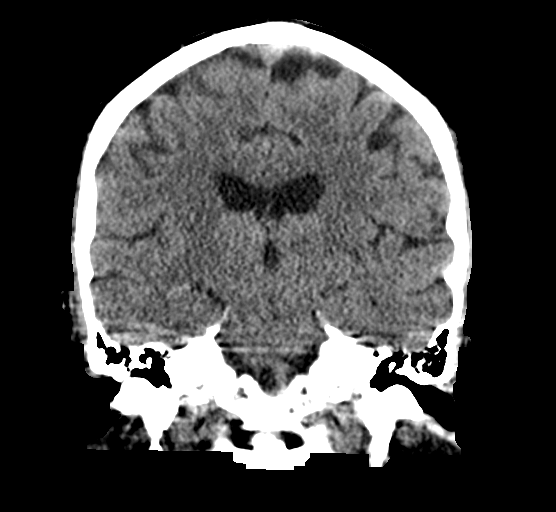

[Series 5: sagittal soft tissue · sagittal · 0.35mm/px · 3 of 56 slices shown]
[im 19/56  brain]
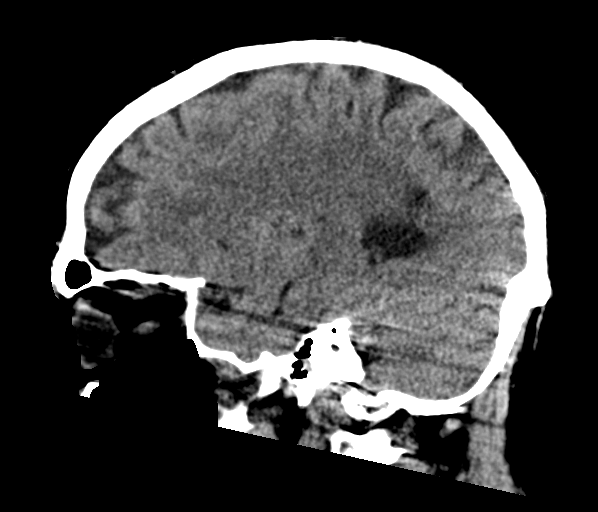
[im 28/56  brain]
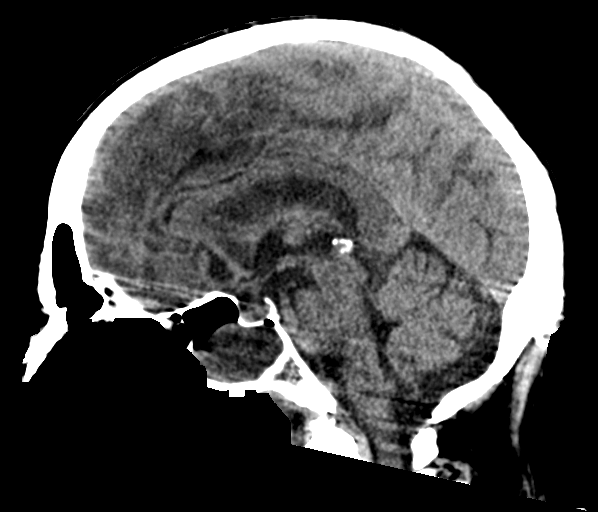
[im 37/56  brain]
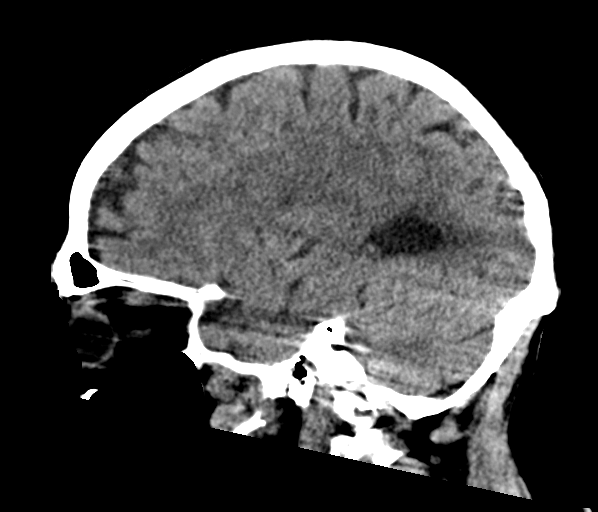

[15 of 47 positions shown; findings below may reference images not displayed]

FINDINGS: The study is mildly motion degraded.

Brain: There is no evidence of an acute cortically based infarct,
intracranial hemorrhage, mass, midline shift, or extra-axial fluid
collection. Hypodensities in the cerebral white matter bilaterally
are nonspecific but compatible with mild-to-moderate chronic small
vessel ischemic disease. Lacunar infarcts in the left greater than
right basal ganglia are chronic in appearance. There are age
indeterminate lacunar infarcts in the left corona radiata and left
thalamus. The ventricles and sulci are within normal limits for age.

Vascular: Calcified atherosclerosis at the skull base. No hyperdense
vessel.

Skull: No fracture or suspicious osseous lesion.

Sinuses/Orbits: Large mucous retention cyst in the right sphenoid
sinus. Clear mastoid air cells. Unremarkable orbits.

Other: None.
IMPRESSION: 1. No evidence of an acute cortically based infarct or intracranial
hemorrhage.
2. Age indeterminate lacunar infarcts in the left corona radiata and
left thalamus.
3. Chronic small vessel ischemia with chronic lacunar infarcts in
the basal ganglia.

## 2023-01-24 IMAGING — DX DG CHEST 1V PORT
1 series · 1 of 1 positions shown · non-contrast
Comparison: Chest x-rays dated 12/10/2020, 12/09/2020 and
04/27/2008

CLINICAL DATA: Worsening leg edema, shortness of breath. History of
congestive heart failure.

EXAM:
PORTABLE CHEST 1 VIEW

[chest ap]
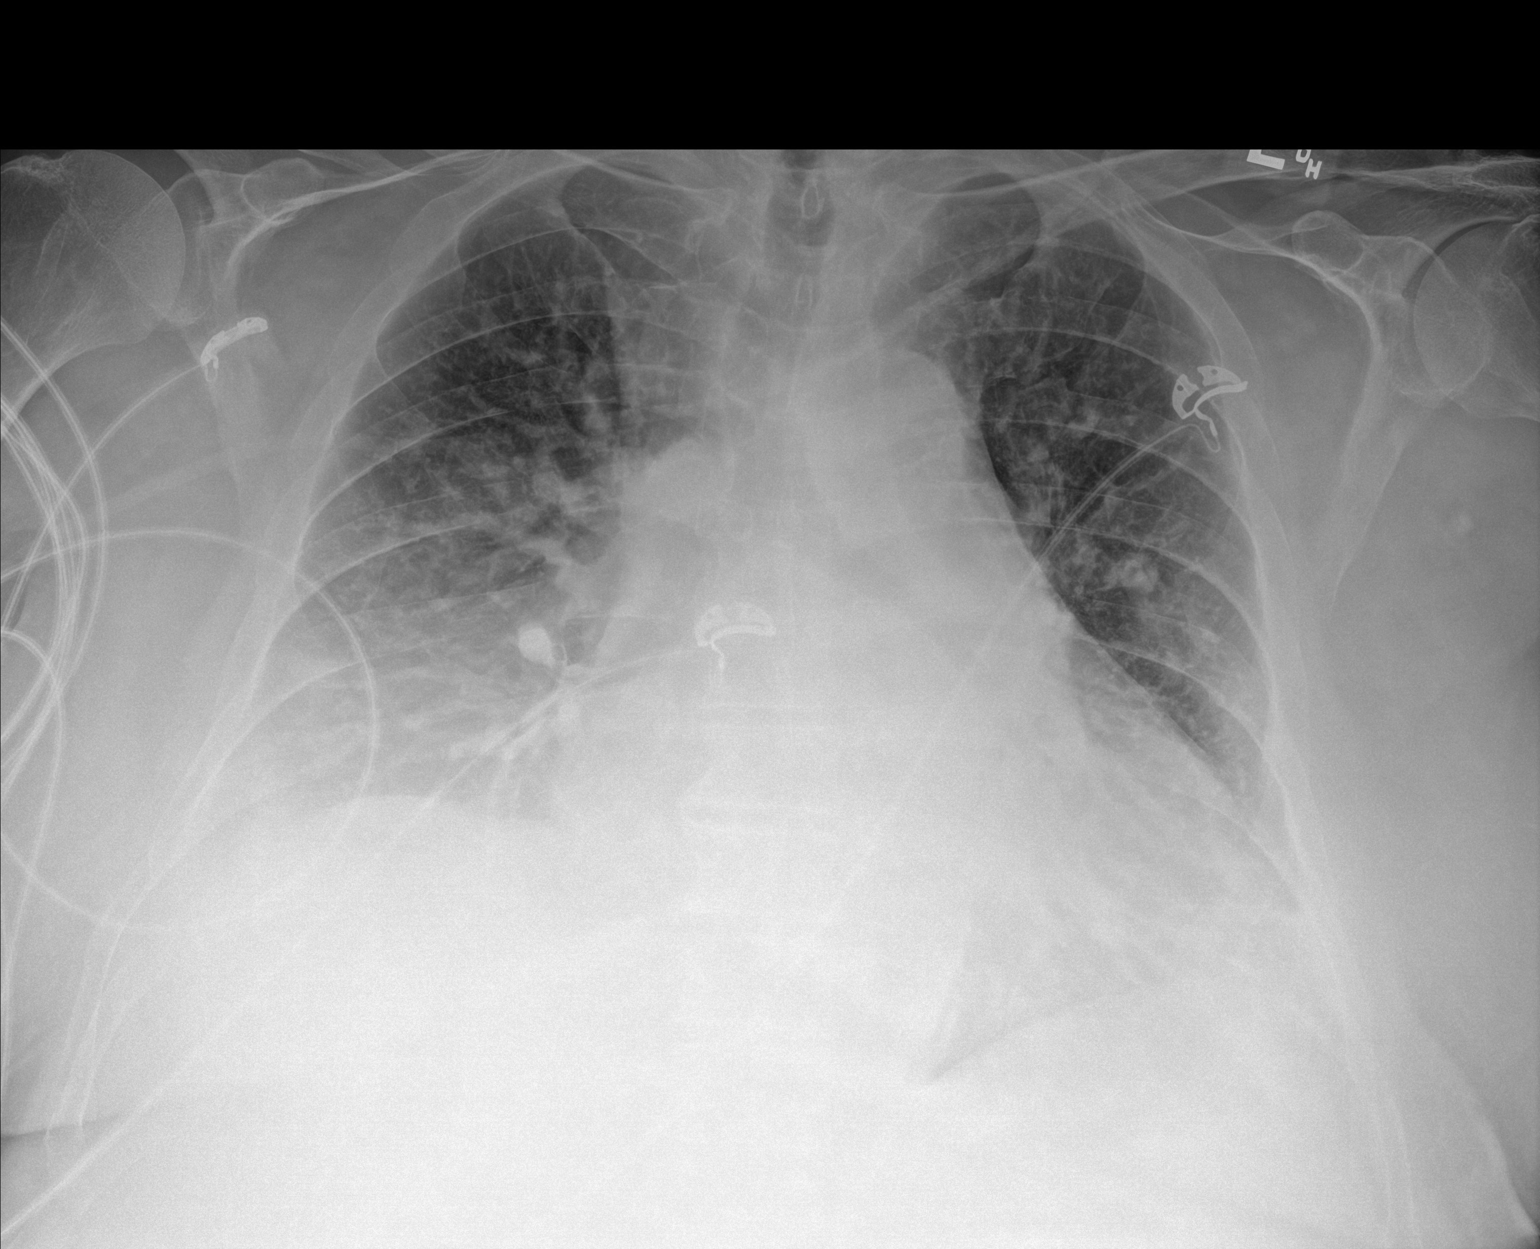

[1 of 1 positions shown; findings below may reference images not displayed]

FINDINGS: Stable cardiomegaly. Prominent interstitial markings are again seen
bilaterally. Hazy opacity persists at the RIGHT lung base, likely
indicating small RIGHT pleural effusion. No new lung findings. No
pneumothorax is seen.
IMPRESSION: 1. Stable chest x-ray. Probable small RIGHT pleural effusion.
Underlying pneumonia not excluded.
2. Stable cardiomegaly and mild interstitial prominence, likely a
chronic mild CHF.

## 2023-02-01 DIAGNOSIS — E876 Hypokalemia: Secondary | ICD-10-CM | POA: Diagnosis not present

## 2023-02-01 DIAGNOSIS — T502X5A Adverse effect of carbonic-anhydrase inhibitors, benzothiadiazides and other diuretics, initial encounter: Secondary | ICD-10-CM | POA: Diagnosis not present

## 2023-02-01 DIAGNOSIS — I10 Essential (primary) hypertension: Secondary | ICD-10-CM | POA: Diagnosis not present

## 2023-02-01 DIAGNOSIS — E118 Type 2 diabetes mellitus with unspecified complications: Secondary | ICD-10-CM | POA: Diagnosis not present

## 2023-02-01 DIAGNOSIS — I2510 Atherosclerotic heart disease of native coronary artery without angina pectoris: Secondary | ICD-10-CM | POA: Diagnosis not present

## 2023-02-01 DIAGNOSIS — Z79899 Other long term (current) drug therapy: Secondary | ICD-10-CM | POA: Diagnosis not present

## 2023-02-01 DIAGNOSIS — I480 Paroxysmal atrial fibrillation: Secondary | ICD-10-CM | POA: Diagnosis not present

## 2023-02-01 DIAGNOSIS — Z125 Encounter for screening for malignant neoplasm of prostate: Secondary | ICD-10-CM | POA: Diagnosis not present

## 2023-02-01 DIAGNOSIS — Z1211 Encounter for screening for malignant neoplasm of colon: Secondary | ICD-10-CM | POA: Diagnosis not present

## 2023-02-01 DIAGNOSIS — N183 Chronic kidney disease, stage 3 unspecified: Secondary | ICD-10-CM | POA: Diagnosis not present

## 2023-02-01 DIAGNOSIS — E782 Mixed hyperlipidemia: Secondary | ICD-10-CM | POA: Diagnosis not present

## 2023-02-05 DIAGNOSIS — Z125 Encounter for screening for malignant neoplasm of prostate: Secondary | ICD-10-CM | POA: Diagnosis not present

## 2023-02-05 DIAGNOSIS — I480 Paroxysmal atrial fibrillation: Secondary | ICD-10-CM | POA: Diagnosis not present

## 2023-02-05 DIAGNOSIS — E118 Type 2 diabetes mellitus with unspecified complications: Secondary | ICD-10-CM | POA: Diagnosis not present

## 2023-02-05 DIAGNOSIS — E782 Mixed hyperlipidemia: Secondary | ICD-10-CM | POA: Diagnosis not present

## 2023-02-05 DIAGNOSIS — Z79899 Other long term (current) drug therapy: Secondary | ICD-10-CM | POA: Diagnosis not present

## 2023-02-05 DIAGNOSIS — I10 Essential (primary) hypertension: Secondary | ICD-10-CM | POA: Diagnosis not present

## 2023-02-19 DIAGNOSIS — N183 Chronic kidney disease, stage 3 unspecified: Secondary | ICD-10-CM | POA: Diagnosis not present

## 2023-02-19 DIAGNOSIS — I2510 Atherosclerotic heart disease of native coronary artery without angina pectoris: Secondary | ICD-10-CM | POA: Diagnosis not present

## 2023-02-19 DIAGNOSIS — I429 Cardiomyopathy, unspecified: Secondary | ICD-10-CM | POA: Diagnosis not present

## 2023-02-19 DIAGNOSIS — I10 Essential (primary) hypertension: Secondary | ICD-10-CM | POA: Diagnosis not present

## 2023-02-19 DIAGNOSIS — I480 Paroxysmal atrial fibrillation: Secondary | ICD-10-CM | POA: Diagnosis not present

## 2023-02-19 DIAGNOSIS — I5022 Chronic systolic (congestive) heart failure: Secondary | ICD-10-CM | POA: Diagnosis not present

## 2023-03-08 ENCOUNTER — Other Ambulatory Visit: Payer: Self-pay | Admitting: Family

## 2023-03-11 DIAGNOSIS — I480 Paroxysmal atrial fibrillation: Secondary | ICD-10-CM | POA: Diagnosis not present

## 2023-04-19 DIAGNOSIS — I480 Paroxysmal atrial fibrillation: Secondary | ICD-10-CM | POA: Diagnosis not present

## 2023-05-07 DIAGNOSIS — N2581 Secondary hyperparathyroidism of renal origin: Secondary | ICD-10-CM | POA: Diagnosis not present

## 2023-05-07 DIAGNOSIS — D631 Anemia in chronic kidney disease: Secondary | ICD-10-CM | POA: Diagnosis not present

## 2023-05-07 DIAGNOSIS — N1831 Chronic kidney disease, stage 3a: Secondary | ICD-10-CM | POA: Diagnosis not present

## 2023-05-07 DIAGNOSIS — I10 Essential (primary) hypertension: Secondary | ICD-10-CM | POA: Diagnosis not present

## 2023-05-22 NOTE — Progress Notes (Signed)
 [facility] Note    PCP: Auston Purchase, MD (last seen 10/24) Primary Cardiologist: Ammon Blunt, MD (last seen 11/24; returns 11/25)  Chief Complaint: fatigue  HPI:  Timothy Chavez is a 73 y/o male with a history of HTN, CAD, CKD, hyperlipidemia, atrial fibrillation, anemia, hyperparathyroidism, DM & chronic heart failure.   Has not been admitted or been in the ED in the last 6 months.   Echo 12/10/20: EF of 50-55% along with mild LVH.  He presents today for a HF follow-up visit with a chief complaint of minimal fatigue. Has associated occasional dizziness and gradual weight gain along with this. Denies shortness of breath, chest pain, cough, palpitations, abdominal distention, pedal edema or difficulty sleeping. Reports sleeping well on 1 pillow. Says that he has been out of town working on a condo at [place] for the last 2 months. During this time he has been eating out all the time eating pizza, hamburgers etc because it's fast. He doesn't feel like his weight gain is due to fluid retention but due to eating out so much. 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   Currently taking metolazone  weekly on Sundays. Wearing compression socks daily.   ROS: All systems negative except as listed in HPI, PMH and Problem List.  SH:  Social History   Socioeconomic History   Marital status: Single    Spouse name: Not on file   Number of children: Not on file   Years of education: Not on file   Highest education level: Not on file  Occupational History   Not on file  Tobacco Use   Smoking status: Never    Passive exposure: Never   Smokeless tobacco: Never  Substance and Sexual Activity   Alcohol use: Not Currently   Drug use: Not Currently   Sexual activity: Not on file  Other Topics Concern   Not on file  Social History Narrative   Not on file   Social Drivers of Health   Financial Resource Strain: Not  on file  Food Insecurity: Not on file  Transportation Needs: Not on file  Physical Activity: Not on file  Stress: Not on file  Social Connections: Not on file  Intimate Partner Violence: Not on file    FH:  Family History  Problem Relation Age of Onset   Hypertension Sister    Anemia Sister    Anemia Sister    Anemia Sister    Kidney disease Sister     Past Medical History:  Diagnosis Date   A-fib (HCC)    CHF (congestive heart failure) (HCC)    Coronary artery disease    Diabetes mellitus without complication (HCC)    Hypertension    Renal disorder     Current Outpatient Medications  Medication Sig Dispense Refill   amLODipine  (NORVASC ) 5 MG tablet Take 1 tablet (5 mg total) by mouth daily. 90 tablet 3   calcitRIOL  (ROCALTROL ) 0.25 MCG capsule Take 0.25 mcg by mouth once a week.     carvedilol  (COREG ) 25 MG tablet Take 25 mg by mouth 2 (two) times daily.     enalapril  (VASOTEC ) 20 MG tablet Take 1 tablet (20 mg total) by mouth daily. 30 tablet 5   furosemide  (LASIX ) 40 MG tablet Take 40 mg by mouth daily.     metolazone  (ZAROXOLYN ) 2.5 MG tablet Take 1 tablet (2.5 mg total) by mouth once a week. 12  tablet 3   potassium chloride  SA (KLOR-CON ) 20 MEQ tablet Take 20 mEq by mouth daily. And additional 20meq potassium with each dose of metolazone      pravastatin  (PRAVACHOL ) 40 MG tablet Take 40 mg by mouth daily.     warfarin (COUMADIN ) 4 MG tablet Take 4 mg by mouth daily. Take 4 mg Tuesday, Thursday and Saturday and 5 mg on Monday, Wednesday, Friday and Sunday.     No current facility-administered medications for this visit.   Vitals:   05/23/23 0838  BP: (!) 147/85  Pulse: 74  SpO2: 98%  Weight: 252 lb (114.3 kg)   Wt Readings from Last 3 Encounters:  05/23/23 252 lb (114.3 kg)  11/02/22 222 lb 3.2 oz (100.8 kg)  09/20/22 230 lb (104.3 kg)   Lab Results  Component Value Date   CREATININE 1.74 (H) 12/16/2020   CREATININE 1.84 (H) 12/15/2020   CREATININE 2.48  (H) 12/13/2020    PHYSICAL EXAM:  General: Well appearing. No resp difficulty HEENT: normal Neck: supple, no JVD Cor: Regular rhythm, rate. No rubs, gallops or murmurs Lungs: clear Abdomin: soft, nontender, nondistended. Extremities: no cyanosis, clubbing, rash, 1+ bilateral pitting edema (wearing compression socks) Neuro: alert & oriented X 3. Moves all 4 extremities w/o difficulty. Affect pleasant   ECG: not done (per patient preference)   ASSESSMENT & PLAN:  1: Chronic heart failure with preserved ejection fraction- - suspect due to HTN - NYHA Chavez II - euvolemic today - resume weighing daily now that he's back home; reminded to call for an overnight weight gain of > 2 pounds or a weekly weight gain of > 5 pounds - weight up 30 pounds from last visit here 6 months ago; he says it's all been gradual due to being out of town for several months and eating out all the time - echo 12/10/20: EF of 50-55% along with mild LVH. - saw cardiology 11/24 but no echo has been done yet; will reach out to discuss; patient has to have it done in the office to make sure it's being covered by his insurance - had severe rash with farxiga with peeling of palms of the hands - he does not want to change his enalapril  to entresto - continue carvedilol  25mg  BID - continue enalapril  20mg  QD - continue furosemide  40mg  QD/ potassium 20meq daily - continue metolazone  2.5mg  weekly w/ additional 20meq potassium but for the next 2 weeks, take metolazone  twice weekly - reviewed watching sodium intake carefully and now that he's back home for several weeks, get his diet back on track - wearing compression socks  - BNP 12/12/20 was 240.8  2: HTN- - BP 147/85 - continue amlodipine  5mg  daily - saw PCP (Sparks) 10/24; returns 2 weeks - BMP 05/07/23 reviewed and showed sodium 138, potassium 3.9, creatinine 1.52 and GFR 49   3: DM (followed by PCP)- - saw nephrology Geoffry) 01/25 - A1c 02/05/23 reviewed and  was 6.4% - diet controlled  4: Paroxsymal atrial fibrillation- - saw cardiology (Paraschos) 11/24 - continue carvedilol  25mg  BID - warfarin dose managed by cardiology - INR on 04/19/23 reviewed and was 2.9  5: Anemia (managed by GI)- - saw GI [place]) 01/24 - hemoglobin 05/07/23 reviewed and was 10.4 - saw hematology Garrick) 05/24  Return in 6 months, sooner if needed  Timothy DELENA Class, FNP 05/22/23

## 2023-05-23 ENCOUNTER — Encounter: Payer: Self-pay | Admitting: Family

## 2023-05-23 ENCOUNTER — Ambulatory Visit: Payer: PPO | Attending: Family | Admitting: Family

## 2023-05-23 VITALS — BP 147/85 | HR 74 | Wt 252.0 lb

## 2023-05-23 DIAGNOSIS — Z79899 Other long term (current) drug therapy: Secondary | ICD-10-CM | POA: Insufficient documentation

## 2023-05-23 DIAGNOSIS — E785 Hyperlipidemia, unspecified: Secondary | ICD-10-CM | POA: Insufficient documentation

## 2023-05-23 DIAGNOSIS — I10 Essential (primary) hypertension: Secondary | ICD-10-CM

## 2023-05-23 DIAGNOSIS — E213 Hyperparathyroidism, unspecified: Secondary | ICD-10-CM | POA: Diagnosis not present

## 2023-05-23 DIAGNOSIS — I5032 Chronic diastolic (congestive) heart failure: Secondary | ICD-10-CM

## 2023-05-23 DIAGNOSIS — I2510 Atherosclerotic heart disease of native coronary artery without angina pectoris: Secondary | ICD-10-CM | POA: Insufficient documentation

## 2023-05-23 DIAGNOSIS — I480 Paroxysmal atrial fibrillation: Secondary | ICD-10-CM | POA: Diagnosis not present

## 2023-05-23 DIAGNOSIS — N189 Chronic kidney disease, unspecified: Secondary | ICD-10-CM | POA: Diagnosis not present

## 2023-05-23 DIAGNOSIS — D631 Anemia in chronic kidney disease: Secondary | ICD-10-CM | POA: Diagnosis not present

## 2023-05-23 DIAGNOSIS — D649 Anemia, unspecified: Secondary | ICD-10-CM | POA: Diagnosis not present

## 2023-05-23 DIAGNOSIS — N1831 Type 2 diabetes mellitus with diabetic chronic kidney disease: Secondary | ICD-10-CM

## 2023-05-23 DIAGNOSIS — E1122 Type 2 diabetes mellitus with diabetic chronic kidney disease: Secondary | ICD-10-CM | POA: Insufficient documentation

## 2023-05-23 DIAGNOSIS — I130 Hypertensive heart and chronic kidney disease with heart failure and stage 1 through stage 4 chronic kidney disease, or unspecified chronic kidney disease: Secondary | ICD-10-CM | POA: Insufficient documentation

## 2023-05-23 DIAGNOSIS — Z7901 Long term (current) use of anticoagulants: Secondary | ICD-10-CM | POA: Insufficient documentation

## 2023-05-23 DIAGNOSIS — R5383 Other fatigue: Secondary | ICD-10-CM | POA: Insufficient documentation

## 2023-05-23 NOTE — Patient Instructions (Signed)
 Resume weighing daily and call for an overnight weight gain of 3 pounds or more or a weekly weight gain of more than 5 pounds.   Take your metolazone  on Thursday & Sunday for the next 2 weeks

## 2023-05-27 DIAGNOSIS — I480 Paroxysmal atrial fibrillation: Secondary | ICD-10-CM | POA: Diagnosis not present

## 2023-05-27 DIAGNOSIS — Z7901 Long term (current) use of anticoagulants: Secondary | ICD-10-CM | POA: Diagnosis not present

## 2023-06-05 DIAGNOSIS — I429 Cardiomyopathy, unspecified: Secondary | ICD-10-CM | POA: Diagnosis not present

## 2023-06-05 DIAGNOSIS — I5022 Chronic systolic (congestive) heart failure: Secondary | ICD-10-CM | POA: Diagnosis not present

## 2023-06-05 DIAGNOSIS — N183 Chronic kidney disease, stage 3 unspecified: Secondary | ICD-10-CM | POA: Diagnosis not present

## 2023-06-05 DIAGNOSIS — E782 Mixed hyperlipidemia: Secondary | ICD-10-CM | POA: Diagnosis not present

## 2023-06-05 DIAGNOSIS — Z1211 Encounter for screening for malignant neoplasm of colon: Secondary | ICD-10-CM | POA: Diagnosis not present

## 2023-06-05 DIAGNOSIS — Z0000 Encounter for general adult medical examination without abnormal findings: Secondary | ICD-10-CM | POA: Diagnosis not present

## 2023-06-05 DIAGNOSIS — I10 Essential (primary) hypertension: Secondary | ICD-10-CM | POA: Diagnosis not present

## 2023-06-05 DIAGNOSIS — Z79899 Other long term (current) drug therapy: Secondary | ICD-10-CM | POA: Diagnosis not present

## 2023-06-05 DIAGNOSIS — I480 Paroxysmal atrial fibrillation: Secondary | ICD-10-CM | POA: Diagnosis not present

## 2023-06-05 DIAGNOSIS — D696 Thrombocytopenia, unspecified: Secondary | ICD-10-CM | POA: Diagnosis not present

## 2023-06-05 DIAGNOSIS — E118 Type 2 diabetes mellitus with unspecified complications: Secondary | ICD-10-CM | POA: Diagnosis not present

## 2023-06-17 DIAGNOSIS — Z1211 Encounter for screening for malignant neoplasm of colon: Secondary | ICD-10-CM | POA: Diagnosis not present

## 2023-06-25 DIAGNOSIS — I10 Essential (primary) hypertension: Secondary | ICD-10-CM | POA: Diagnosis not present

## 2023-06-25 DIAGNOSIS — I480 Paroxysmal atrial fibrillation: Secondary | ICD-10-CM | POA: Diagnosis not present

## 2023-06-25 DIAGNOSIS — Z7901 Long term (current) use of anticoagulants: Secondary | ICD-10-CM | POA: Diagnosis not present

## 2023-07-04 DIAGNOSIS — Z79899 Other long term (current) drug therapy: Secondary | ICD-10-CM | POA: Diagnosis not present

## 2023-07-29 DIAGNOSIS — I480 Paroxysmal atrial fibrillation: Secondary | ICD-10-CM | POA: Diagnosis not present

## 2023-08-07 DIAGNOSIS — Z7901 Long term (current) use of anticoagulants: Secondary | ICD-10-CM | POA: Diagnosis not present

## 2023-08-07 DIAGNOSIS — I480 Paroxysmal atrial fibrillation: Secondary | ICD-10-CM | POA: Diagnosis not present

## 2023-09-02 DIAGNOSIS — Z7901 Long term (current) use of anticoagulants: Secondary | ICD-10-CM | POA: Diagnosis not present

## 2023-09-02 DIAGNOSIS — I480 Paroxysmal atrial fibrillation: Secondary | ICD-10-CM | POA: Diagnosis not present

## 2023-09-18 DIAGNOSIS — Z7901 Long term (current) use of anticoagulants: Secondary | ICD-10-CM | POA: Diagnosis not present

## 2023-09-18 DIAGNOSIS — I480 Paroxysmal atrial fibrillation: Secondary | ICD-10-CM | POA: Diagnosis not present

## 2023-10-10 DIAGNOSIS — I480 Paroxysmal atrial fibrillation: Secondary | ICD-10-CM | POA: Diagnosis not present

## 2023-10-10 DIAGNOSIS — Z7901 Long term (current) use of anticoagulants: Secondary | ICD-10-CM | POA: Diagnosis not present

## 2023-10-28 ENCOUNTER — Other Ambulatory Visit: Payer: Self-pay | Admitting: Family

## 2023-11-04 DIAGNOSIS — N1832 Chronic kidney disease, stage 3b: Secondary | ICD-10-CM | POA: Diagnosis not present

## 2023-11-04 DIAGNOSIS — D631 Anemia in chronic kidney disease: Secondary | ICD-10-CM | POA: Diagnosis not present

## 2023-11-04 DIAGNOSIS — N2581 Secondary hyperparathyroidism of renal origin: Secondary | ICD-10-CM | POA: Diagnosis not present

## 2023-11-04 DIAGNOSIS — I10 Essential (primary) hypertension: Secondary | ICD-10-CM | POA: Diagnosis not present

## 2023-11-07 DIAGNOSIS — Z7901 Long term (current) use of anticoagulants: Secondary | ICD-10-CM | POA: Diagnosis not present

## 2023-11-07 DIAGNOSIS — I480 Paroxysmal atrial fibrillation: Secondary | ICD-10-CM | POA: Diagnosis not present

## 2023-12-05 DIAGNOSIS — Z7901 Long term (current) use of anticoagulants: Secondary | ICD-10-CM | POA: Diagnosis not present

## 2023-12-05 DIAGNOSIS — I480 Paroxysmal atrial fibrillation: Secondary | ICD-10-CM | POA: Diagnosis not present

## 2023-12-11 ENCOUNTER — Telehealth: Payer: Self-pay | Admitting: Family

## 2023-12-11 NOTE — Telephone Encounter (Signed)
 Called to confirm/remind patient of their appointment at the [facility] on 12/12/23.   Appointment:   [x] Confirmed  [] Left mess   [] No answer/No voice mail  [] VM Full/unable to leave message  [] Phone not in service  Patient reminded to bring all medications and/or complete list.  Confirmed patient has transportation. Gave directions, instructed to utilize valet parking.

## 2023-12-11 NOTE — Progress Notes (Unsigned)
 Advanced [facility] Failure [facility] Note    PCP: Auston Purchase, MD  Primary Cardiologist: Ammon Blunt, MD (last seen 02/25; returns 11/25)  Chief Complaint: palpitations   HPI:  Mr Heatwole is a 73 y/o male with a history of HTN, CAD, CKD, hyperlipidemia, atrial fibrillation, anemia, hyperparathyroidism, DM & chronic heart failure.   Has not been admitted or been in the ED in the last 6 months.   Echo 12/10/20: EF of 50-55% along with mild LVH.  He presents today for a HF follow-up visit with a chief complaint of occasional palpitations. Has associated pedal edema with R>L. Edema resolves with elevation. Wearing compression socks daily. Currently having arthritic pain in right pinky finger.   Has been eating more white bread /drinking sweet tea than usual. Admits to eating more salt than normal. [place] and ate out more than usual. Has been home for a month but continues to eat out frequently. Last night went to [employer] and had a hamburger/ fries. Recently ate ham biscuits from [employer] and plans to go to [employer] for breakfast after leaving here. 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   ROS: All systems negative except as listed in HPI, PMH and Problem List.  SH:  Social History   Socioeconomic History   Marital status: Single    Spouse name: Not on file   Number of children: Not on file   Years of education: Not on file   Highest education level: Not on file  Occupational History   Not on file  Tobacco Use   Smoking status: Never    Passive exposure: Never   Smokeless tobacco: Never  Substance and Sexual Activity   Alcohol use: Not Currently   Drug use: Not Currently   Sexual activity: Not on file  Other Topics Concern   Not on file  Social History Narrative   Not on file   Social Drivers of Health   Financial Resource Strain: Not on file  Food Insecurity: Not on  file  Transportation Needs: Not on file  Physical Activity: Not on file  Stress: Not on file  Social Connections: Not on file  Intimate Partner Violence: Not on file    FH:  Family History  Problem Relation Age of Onset   Hypertension Sister    Anemia Sister    Anemia Sister    Anemia Sister    Kidney disease Sister     Past Medical History:  Diagnosis Date   A-fib (HCC)    CHF (congestive heart failure) (HCC)    Coronary artery disease    Diabetes mellitus without complication (HCC)    Hypertension    Renal disorder     Current Outpatient Medications  Medication Sig Dispense Refill   amLODipine  (NORVASC ) 5 MG tablet Take 1 tablet (5 mg total) by mouth daily. 90 tablet 3   calcitRIOL  (ROCALTROL ) 0.25 MCG capsule Take 0.25 mcg by mouth once a week.     carvedilol  (COREG ) 25 MG tablet Take 25 mg by mouth 2 (two) times daily.     enalapril  (VASOTEC ) 20 MG tablet Take 1 tablet (20 mg total) by mouth daily. 30 tablet 5   furosemide  (LASIX ) 40 MG tablet Take 40 mg by mouth daily.     metolazone  (ZAROXOLYN ) 2.5 MG tablet Take 1 tablet (2.5 mg total) by mouth once a week. 12 tablet 0  potassium chloride  SA (KLOR-CON ) 20 MEQ tablet Take 20 mEq by mouth daily. And additional 20meq potassium with each dose of metolazone      pravastatin  (PRAVACHOL ) 40 MG tablet Take 40 mg by mouth daily.     warfarin (COUMADIN ) 4 MG tablet Take 4 mg by mouth daily. Take 4 mg daily except 2mg  on Tuesday/ Thursday     No current facility-administered medications for this visit.   Vitals:   12/12/23 0854  BP: (!) 142/80  Pulse: 81  SpO2: 98%  Weight: 251 lb (113.9 kg)   Wt Readings from Last 3 Encounters:  12/12/23 251 lb (113.9 kg)  05/23/23 252 lb (114.3 kg)  11/02/22 222 lb 3.2 oz (100.8 kg)   Lab Results  Component Value Date   CREATININE 1.74 (H) 12/16/2020   CREATININE 1.84 (H) 12/15/2020   CREATININE 2.48 (H) 12/13/2020    PHYSICAL EXAM:  General: Well appearing.  Cor: No JVD.  Regular rhythm, rate.  Lungs: clear Abdomen: soft, nontender, nondistended. Extremities: 2+ soft pitting edema bilateral lower legs up to knees with R>L Neuro:. Affect pleasant   ECG: not done    ASSESSMENT & PLAN:  1: Chronic heart failure with preserved ejection fraction- - suspect due to HTN - NYHA class II - euvolemic today - weight stable from last visit here 6 months ago - echo 12/10/20: EF of 50-55% along with mild LVH. - saw cardiology 11/24 but no echo has been done yet. Patient says that he discussed with him last year and will re-visit this conversation at his upcoming appt 11/25. His insurance requires that it be done at provider office.  - had severe rash with farxiga with peeling of palms of the hands - continue carvedilol  25mg  BID - continue enalapril  20mg  QD - continue furosemide  40mg  QD/ potassium 20meq daily. Wanted to increase diuretic for a few days but he declined.  - continue metolazone  2.5mg  weekly w/ additional 20meq potassium  - reviewed watching sodium intake carefully. Emphasized decreasing his eating out especially at fast food places.  - wearing compression socks daily and says edema resolves once he elevates his legs.  - BNP 12/12/20 was 240.8  2: HTN- - BP 142/80. Says that BP at home running mid-upper 120's - continue amlodipine  5mg  daily - saw PCP (S[facility]ks) 02/25 - BMP 11/04/23 reviewed: sodium 137, potassium 3.6, creatinine 1.58 and GFR 48   3: DM (followed by PCP)- - saw nephrology Geoffry) 07/25 - A1c 06/05/23 reviewed and was 6.5% - diet controlled  4: Paroxsymal atrial fibrillation- - saw cardiology (Paraschos) 02/25; returns 11/25 - continue carvedilol  25mg  BID - warfarin dose managed by cardiology - INR on 12/05/23 reviewed and was 2.3  5: Anemia (managed by GI)- - saw GI [place]) 01/24 - hemoglobin 11/04/23 reviewed and was 10.3 - saw hematology Garrick) 05/24   Return in 6 months, sooner if needed.   Ellouise DELENA Class,  FNP 12/11/23

## 2023-12-12 ENCOUNTER — Encounter: Payer: Self-pay | Admitting: Family

## 2023-12-12 ENCOUNTER — Ambulatory Visit: Payer: PPO | Attending: Family | Admitting: Family

## 2023-12-12 VITALS — BP 142/80 | HR 81 | Wt 251.0 lb

## 2023-12-12 DIAGNOSIS — I2510 Atherosclerotic heart disease of native coronary artery without angina pectoris: Secondary | ICD-10-CM | POA: Diagnosis not present

## 2023-12-12 DIAGNOSIS — I480 Paroxysmal atrial fibrillation: Secondary | ICD-10-CM | POA: Diagnosis not present

## 2023-12-12 DIAGNOSIS — N189 Chronic kidney disease, unspecified: Secondary | ICD-10-CM | POA: Diagnosis not present

## 2023-12-12 DIAGNOSIS — Z79899 Other long term (current) drug therapy: Secondary | ICD-10-CM | POA: Diagnosis not present

## 2023-12-12 DIAGNOSIS — I10 Essential (primary) hypertension: Secondary | ICD-10-CM | POA: Diagnosis not present

## 2023-12-12 DIAGNOSIS — E1122 Type 2 diabetes mellitus with diabetic chronic kidney disease: Secondary | ICD-10-CM | POA: Diagnosis not present

## 2023-12-12 DIAGNOSIS — D631 Anemia in chronic kidney disease: Secondary | ICD-10-CM

## 2023-12-12 DIAGNOSIS — D649 Anemia, unspecified: Secondary | ICD-10-CM | POA: Insufficient documentation

## 2023-12-12 DIAGNOSIS — Z7901 Long term (current) use of anticoagulants: Secondary | ICD-10-CM | POA: Insufficient documentation

## 2023-12-12 DIAGNOSIS — N1831 Chronic kidney disease, stage 3a: Secondary | ICD-10-CM

## 2023-12-12 DIAGNOSIS — I130 Hypertensive heart and chronic kidney disease with heart failure and stage 1 through stage 4 chronic kidney disease, or unspecified chronic kidney disease: Secondary | ICD-10-CM | POA: Insufficient documentation

## 2023-12-12 DIAGNOSIS — I5032 Chronic diastolic (congestive) heart failure: Secondary | ICD-10-CM

## 2023-12-12 DIAGNOSIS — R002 Palpitations: Secondary | ICD-10-CM | POA: Diagnosis present

## 2023-12-12 NOTE — Patient Instructions (Signed)
 It was good to see you today!  If you receive a satisfaction survey regarding the Heart Failure Clinic, please take the time to fill it out. This way we can continue to provide excellent care and make any changes that need to be made.

## 2024-01-07 DIAGNOSIS — I480 Paroxysmal atrial fibrillation: Secondary | ICD-10-CM | POA: Diagnosis not present

## 2024-01-07 DIAGNOSIS — Z7901 Long term (current) use of anticoagulants: Secondary | ICD-10-CM | POA: Diagnosis not present

## 2024-01-19 ENCOUNTER — Other Ambulatory Visit: Payer: Self-pay | Admitting: Family

## 2024-02-10 DIAGNOSIS — I480 Paroxysmal atrial fibrillation: Secondary | ICD-10-CM | POA: Diagnosis not present

## 2024-02-10 DIAGNOSIS — Z7901 Long term (current) use of anticoagulants: Secondary | ICD-10-CM | POA: Diagnosis not present

## 2024-03-02 DIAGNOSIS — I480 Paroxysmal atrial fibrillation: Secondary | ICD-10-CM | POA: Diagnosis not present

## 2024-03-02 DIAGNOSIS — I429 Cardiomyopathy, unspecified: Secondary | ICD-10-CM | POA: Diagnosis not present

## 2024-03-02 DIAGNOSIS — I10 Essential (primary) hypertension: Secondary | ICD-10-CM | POA: Diagnosis not present

## 2024-03-02 DIAGNOSIS — E782 Mixed hyperlipidemia: Secondary | ICD-10-CM | POA: Diagnosis not present

## 2024-03-02 DIAGNOSIS — I2510 Atherosclerotic heart disease of native coronary artery without angina pectoris: Secondary | ICD-10-CM | POA: Diagnosis not present

## 2024-03-02 DIAGNOSIS — E118 Type 2 diabetes mellitus with unspecified complications: Secondary | ICD-10-CM | POA: Diagnosis not present

## 2024-03-02 DIAGNOSIS — I5022 Chronic systolic (congestive) heart failure: Secondary | ICD-10-CM | POA: Diagnosis not present

## 2024-03-02 DIAGNOSIS — N183 Chronic kidney disease, stage 3 unspecified: Secondary | ICD-10-CM | POA: Diagnosis not present

## 2024-03-05 ENCOUNTER — Other Ambulatory Visit: Payer: Self-pay | Admitting: Family

## 2024-03-05 NOTE — Progress Notes (Signed)
 LETICIA MCDIARMID                                          MRN: [id number]   03/05/2024   The [facility] Quality Team Specialist reviewed this patient medical record for the purposes of chart review for care gap closure. The following were reviewed: abstraction for care gap closure-controlling blood pressure.    [facility] Quality Team

## 2024-04-03 NOTE — Progress Notes (Signed)
 Documentation for warfarin management Last 3 INR results:  Lab Results  Component Value Date   INR 2.9 (!) 04/03/2024   INR 2.6 (!) 03/02/2024   INR 3.9 (!) 02/10/2024    Dosage adjustment or any change in the treatment regimen today: No.  INR within goal range. Patient advised to continue current warfarin dosing regimen of 20mg  total weekly dose, 4mg  (4mg  x 1)Wed,Fri,Sun and 2mg  (4mg  x 0.5) all other days.    Management of result: nurse driven protocol implemented  Patient/caregiver education provided during today's visit: dosing.  Patient/caregiver did verbalize understanding of the no change in dose and any education provided.   The patient/caregiver is instructed to return in one month for recheck.  Future Appointments     Date/Time Provider Department Center Visit Type   05/04/2024 8:00 AM [facility] NURSE POD B [facility] C NURSE VISIT   05/04/2024 8:00 AM [facility] C ANTI-COAG   09/24/2024 8:15 AM Sparks, Reyes BIRCH, MD [facility] MARYL BROCKS [facility] OFFICE VISIT   03/01/2025 8:45 AM Paraschos, Marsa, MD [facility] C FOLLOW UP

## 2024-04-12 ENCOUNTER — Other Ambulatory Visit: Payer: Self-pay | Admitting: Family

## 2024-05-04 ENCOUNTER — Telehealth: Payer: Self-pay | Admitting: Family

## 2024-05-04 NOTE — Telephone Encounter (Signed)
 Called to get appt moved up per pcp

## 2024-06-15 ENCOUNTER — Encounter: Admitting: Family
